# Patient Record
Sex: Female | Born: 1940 | Race: White | Hispanic: No | Smoking: Never smoker
Health system: Southern US, Community
[De-identification: ages and names within clinical notes are randomized; demographics above are authoritative.]

## PROBLEM LIST (undated history)

## (undated) DIAGNOSIS — R002 Palpitations: Secondary | ICD-10-CM

## (undated) DIAGNOSIS — R519 Headache, unspecified: Secondary | ICD-10-CM

## (undated) DIAGNOSIS — K5900 Constipation, unspecified: Secondary | ICD-10-CM

## (undated) DIAGNOSIS — I493 Ventricular premature depolarization: Secondary | ICD-10-CM

## (undated) DIAGNOSIS — D649 Anemia, unspecified: Secondary | ICD-10-CM

## (undated) DIAGNOSIS — E785 Hyperlipidemia, unspecified: Secondary | ICD-10-CM

## (undated) DIAGNOSIS — M797 Fibromyalgia: Secondary | ICD-10-CM

## (undated) DIAGNOSIS — C801 Malignant (primary) neoplasm, unspecified: Secondary | ICD-10-CM

## (undated) HISTORY — PX: BACK SURGERY: SHX140

## (undated) HISTORY — PX: TUBAL LIGATION: SHX77

## (undated) HISTORY — PX: BRAIN SURGERY: SHX531

## (undated) HISTORY — PX: BREAST SURGERY: SHX581

## (undated) HISTORY — PX: OTHER SURGICAL HISTORY: SHX169

## (undated) HISTORY — PX: CHOLECYSTECTOMY: SHX55

---

## 1992-10-27 HISTORY — PX: CHOLECYSTECTOMY: SHX55

## 1998-10-27 HISTORY — PX: BRAIN SURGERY: SHX531

## 2007-09-10 ENCOUNTER — Encounter: Admission: RE | Admit: 2007-09-10 | Discharge: 2007-09-10 | Payer: Self-pay | Admitting: Obstetrics and Gynecology

## 2009-02-27 ENCOUNTER — Emergency Department (HOSPITAL_COMMUNITY): Admission: EM | Admit: 2009-02-27 | Discharge: 2009-02-27 | Payer: Self-pay | Admitting: Emergency Medicine

## 2009-03-21 ENCOUNTER — Encounter: Admission: RE | Admit: 2009-03-21 | Discharge: 2009-03-21 | Payer: Self-pay | Admitting: Gastroenterology

## 2009-12-27 IMAGING — CT CT VIRTUAL COLONOSCOPY DIAGNOSTIC
3 of 7 series · 15 of 46 positions shown, 17 images · non-contrast
Comparison: No previous studies for comparison.

CLINICAL DATA: Left side abdominal pain with blood in stool.
Alternating diarrhea and constipation. Previous colonoscopy on
01/04/1999.

CT VIRTUAL COLONOSCOPY DIAGNOSTIC
TECHNIQUE: The patient was given a standard LoSo bowel
preparation with Gastrografin and barium for fluid and stool
tagging respectively.  The quality of the bowel preparation is
excellent.  Automated CO2 insufflation of the colon was performed
prior to image acquisition and colonic distention is moderate to
poor.  Image post processing was used to generate a 3D endoluminal
fly-through projection of the colon and to electronically subtract
stool/fluid as appropriate.

[Series 2: supine · axial · 0.79mm/px · z∈[-411,-31]mm · 9 of 381 slices shown, 11 images (1 of 2)]
[im 39/381  soft-tissue]
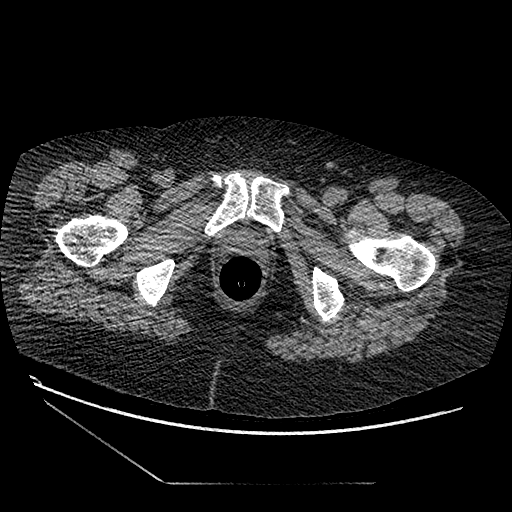
[im 39/381  bone]
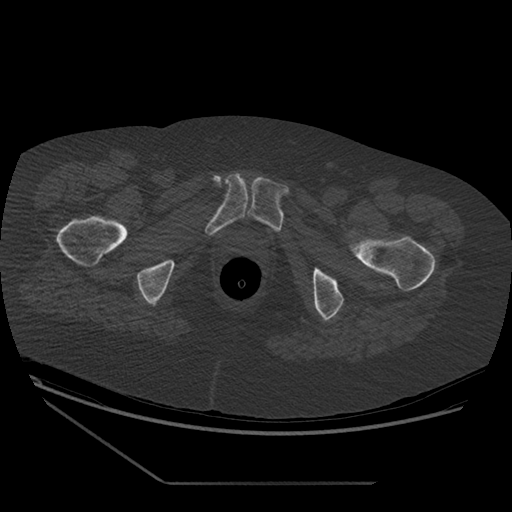
[im 77/381  soft-tissue]
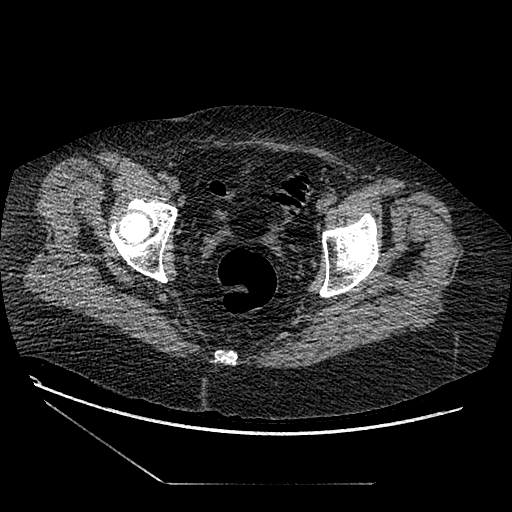
[im 115/381  soft-tissue]
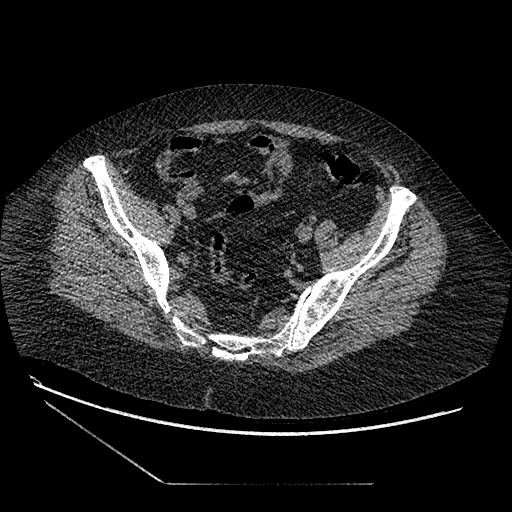
[im 153/381  soft-tissue]
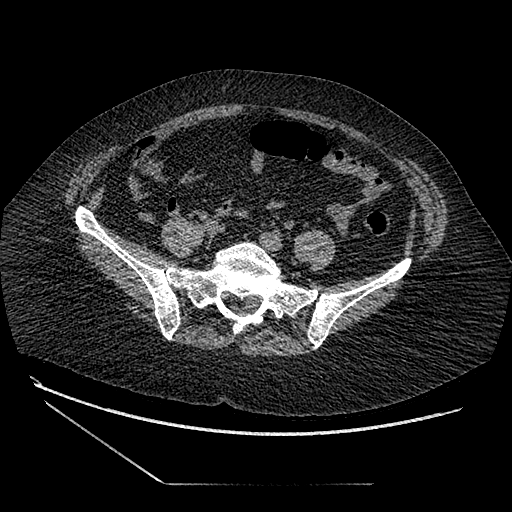
[im 191/381  soft-tissue]
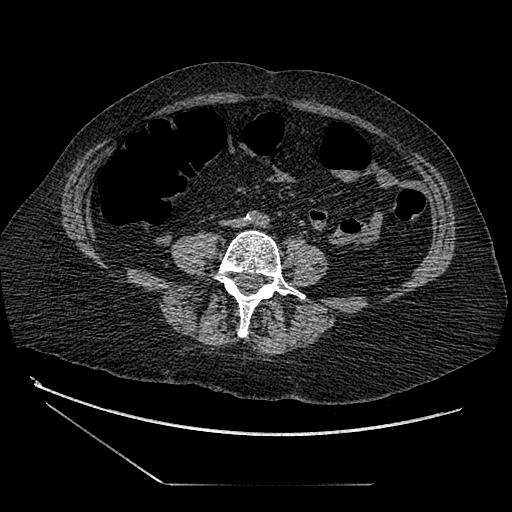
[im 229/381  soft-tissue]
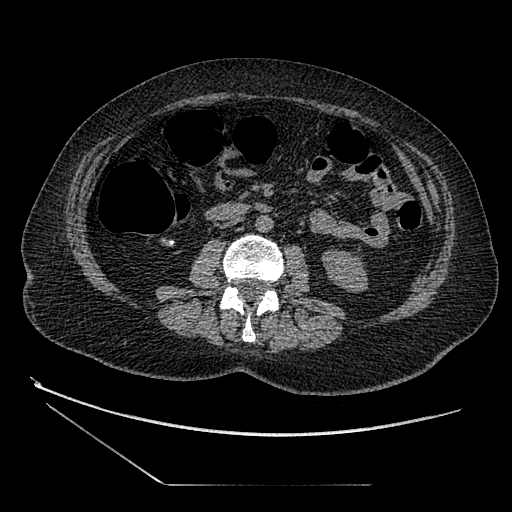
[im 267/381  soft-tissue]
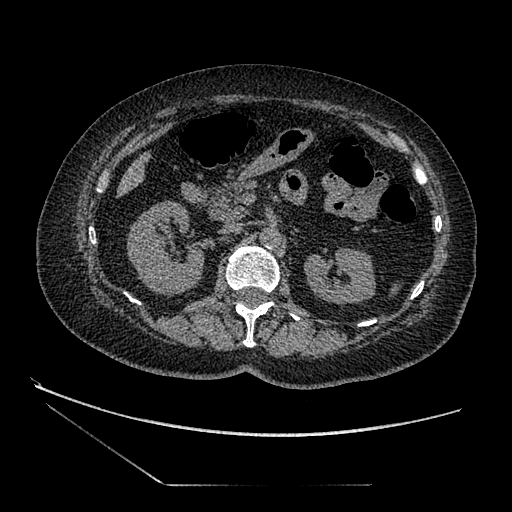
[im 305/381  soft-tissue]
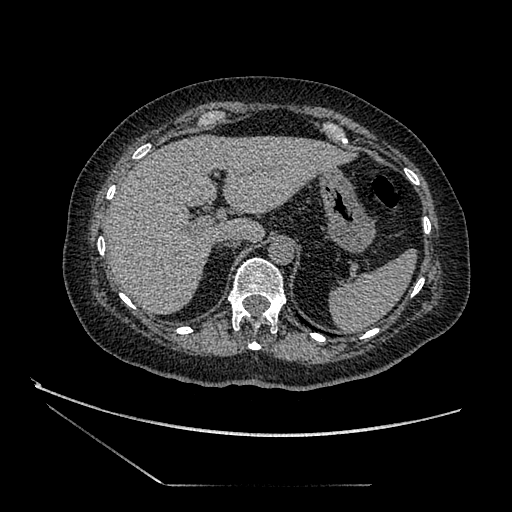
[im 343/381  soft-tissue]
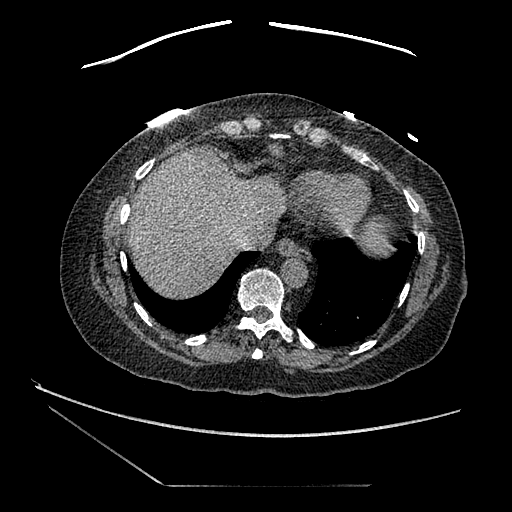
[im 343/381  bone]
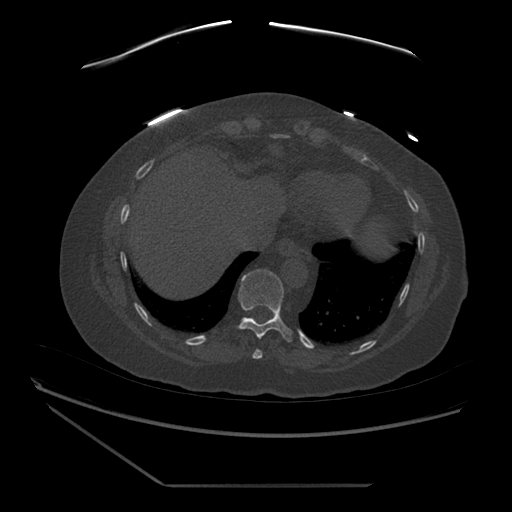

[Series 3: supine · axial · 0.79mm/px · z∈[-341,-103]mm · 3 of 191 slices shown (2 of 2)]
[im 48/191  soft-tissue]
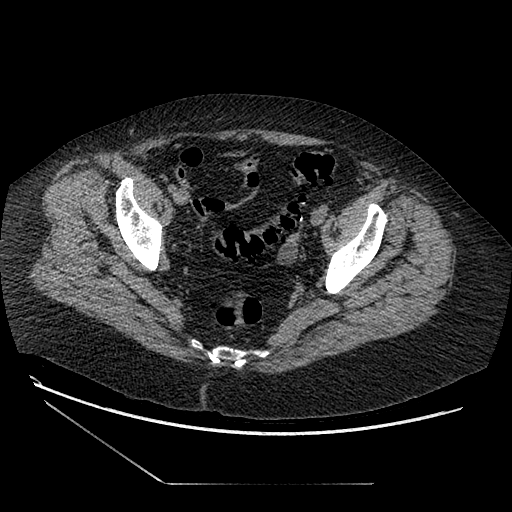
[im 96/191  soft-tissue]
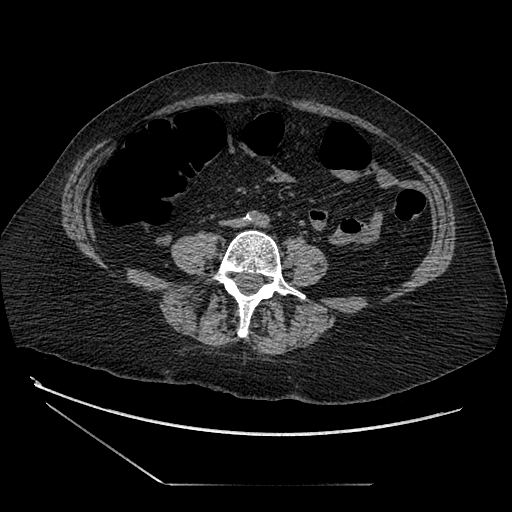
[im 143/191  soft-tissue]
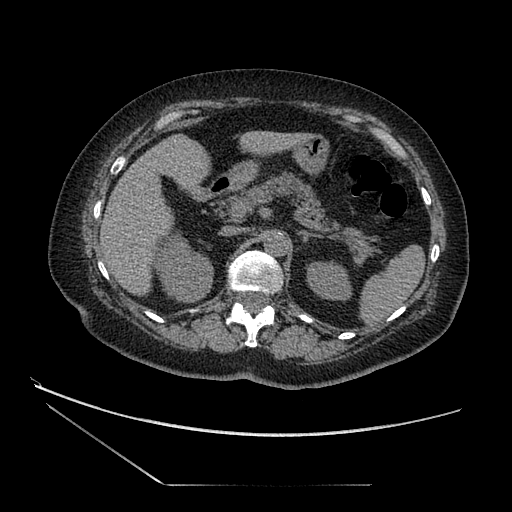

[Series 200: cor · coronal · 0.92mm/px · 3 of 121 slices shown]
[im 41/121  soft-tissue]
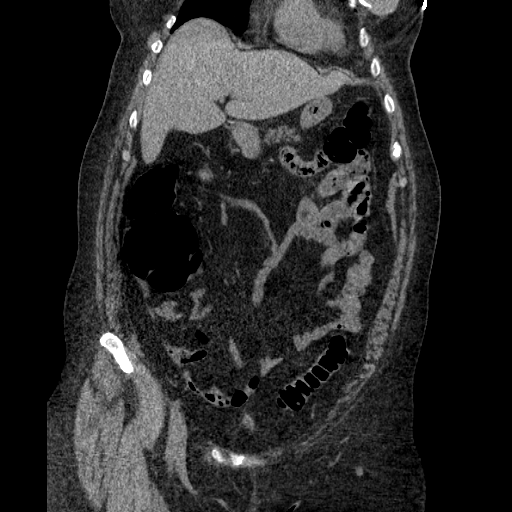
[im 54/121  soft-tissue]
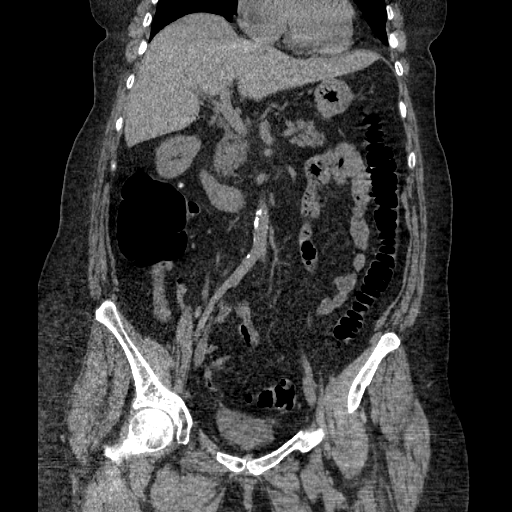
[im 67/121  soft-tissue]
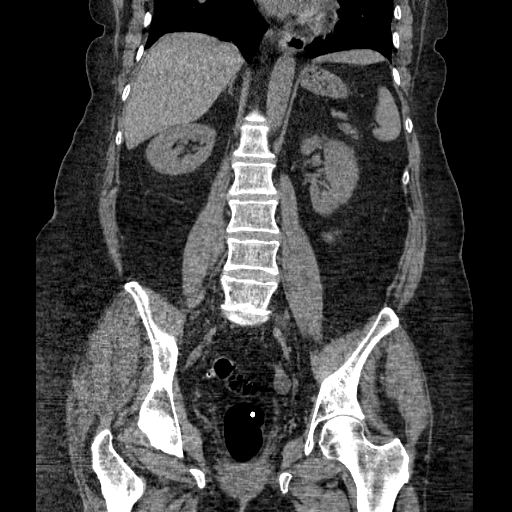

[15 of 46 positions shown; findings below may reference images not displayed]

FINDINGS: There is no clinically significant colonic polyp or mass.
The patient does have scattered diverticular change along the
length of the colon without evidence for diverticulitis.  The
terminal ileum is normal.  Despite the reported history of
appendectomy, a normal appendix is visualized on today's exam.
IMPRESSION: No clinically significant colonic polyp or mass.  This is a C-RADS
category C1 exam for normal virtual colonoscopy.

Virtual colonoscopy is not designed to detect diminutive polyps
(i.e., less than or equal to 5 mm), the presence or absence of
which may not affect clinical management

CT ABDOMEN AND PELVIS WITHOUT CONTRAST

CT ABDOMEN
FINDINGS: Several very tiny hypodensities are identified in the
right liver.  By report, the patient had some liver lesions seen on
an outside CT scan, which we do not have access to.  These lesions
are quite small on today's study and although they cannot be
definitively characterize, are probably related to a tiny hepatic
cysts.  No focal abnormalities seen in the spleen.  The stomach,
duodenum, pancreas, and adrenal glands are normal.  The gallbladder
is surgically absent.

Right kidney is unremarkable.  A 1.3 cm hyperattenuating lesion is
seen in the lower pole of the left kidney.

There is no intraperitoneal free fluid.  No abdominal
lymphadenopathy.  No abdominal aortic aneurysm.  Abdominal small
bowel loops have normal imaging features.
IMPRESSION: 1.3 cm hyperattenuating lesion in the lower pole of the left
kidney.  This may well be a cyst complicated by proteinaceous
debris or hemorrhage, but not less likely possibility would be a
solid renal neoplasm.  Comparison to the patient's previous imaging
studies would be the most helpful means to assess stability.  If
there is no previous imaging available, MRI of the kidney without
and with contrast would be the most helpful study to further
evaluate as this may allow definitive characterization and preclude
the need for additional follow-up imaging.

CT PELVIS
FINDINGS: No intraperitoneal free fluid.  No lymphadenopathy.  The
bladder is decompressed.  The uterus is surgically absent.  There
is no adnexal mass.  Pelvic bowel loops are unremarkable.

Bone windows revealed no worrisome lytic or sclerotic osseous
lesion.
IMPRESSION: Unremarkable CT scan of the pelvis.

## 2010-11-17 ENCOUNTER — Encounter: Payer: Self-pay | Admitting: Obstetrics and Gynecology

## 2011-02-04 LAB — POCT CARDIAC MARKERS
CKMB, poc: <1 ng/mL — ABNORMAL LOW (ref 1.0–8.0)
Myoglobin, poc: 39.3 ng/mL (ref 12–200)

## 2011-02-04 LAB — URINALYSIS, ROUTINE W REFLEX MICROSCOPIC
Bilirubin Urine: NEGATIVE
Glucose, UA: NEGATIVE mg/dL
Hgb urine dipstick: NEGATIVE
Ketones, ur: NEGATIVE mg/dL
Nitrite: NEGATIVE
Specific Gravity, Urine: 1.024 (ref 1.005–1.030)

## 2011-02-04 LAB — COMPREHENSIVE METABOLIC PANEL
Albumin: 3.3 g/dL — ABNORMAL LOW (ref 3.5–5.2)
Chloride: 99 mEq/L (ref 96–112)
GFR calc Af Amer: >60 mL/min (ref >60)
Sodium: 139 mEq/L (ref 135–145)
Total Bilirubin: 0.6 mg/dL (ref 0.3–1.2)

## 2011-02-04 LAB — CBC
HCT: 42.9 % (ref 36.0–46.0)
Hemoglobin: 14.6 g/dL (ref 12.0–15.0)
MCHC: 34 g/dL (ref 30.0–36.0)
MCV: 94.3 fL (ref 78.0–100.0)
RBC: 4.55 MIL/uL (ref 3.87–5.11)
RDW: 14.1 % (ref 11.5–15.5)
WBC: 14.1 10*3/uL — ABNORMAL HIGH (ref 4.0–10.5)

## 2011-02-04 LAB — DIFFERENTIAL
Basophils Absolute: 0.1 10*3/uL (ref 0.0–0.1)
Basophils Relative: 1 % (ref 0–1)
Eosinophils Absolute: 0.2 10*3/uL (ref 0.0–0.7)
Lymphocytes Relative: 19 % (ref 12–46)
Monocytes Absolute: 0.9 10*3/uL (ref 0.1–1.0)
Monocytes Relative: 7 % (ref 3–12)

## 2014-05-26 DIAGNOSIS — E785 Hyperlipidemia, unspecified: Secondary | ICD-10-CM

## 2014-05-26 DIAGNOSIS — J302 Other seasonal allergic rhinitis: Secondary | ICD-10-CM

## 2014-05-26 DIAGNOSIS — D329 Benign neoplasm of meninges, unspecified: Secondary | ICD-10-CM

## 2014-05-26 DIAGNOSIS — M797 Fibromyalgia: Secondary | ICD-10-CM | POA: Insufficient documentation

## 2014-05-26 DIAGNOSIS — I10 Essential (primary) hypertension: Secondary | ICD-10-CM | POA: Insufficient documentation

## 2014-05-26 HISTORY — DX: Other seasonal allergic rhinitis: J30.2

## 2014-05-26 HISTORY — DX: Hyperlipidemia, unspecified: E78.5

## 2014-05-26 HISTORY — DX: Benign neoplasm of meninges, unspecified: D32.9

## 2014-05-26 HISTORY — DX: Essential (primary) hypertension: I10

## 2014-06-28 DIAGNOSIS — G43B1 Ophthalmoplegic migraine, intractable: Secondary | ICD-10-CM

## 2014-06-28 DIAGNOSIS — R5382 Chronic fatigue, unspecified: Secondary | ICD-10-CM

## 2014-06-28 DIAGNOSIS — J0130 Acute sphenoidal sinusitis, unspecified: Secondary | ICD-10-CM

## 2014-06-28 HISTORY — DX: Chronic fatigue, unspecified: R53.82

## 2014-06-28 HISTORY — DX: Acute sphenoidal sinusitis, unspecified: J01.30

## 2014-06-28 HISTORY — DX: Ophthalmoplegic migraine, intractable: G43.B1

## 2015-07-04 DIAGNOSIS — K2270 Barrett's esophagus without dysplasia: Secondary | ICD-10-CM | POA: Insufficient documentation

## 2015-07-04 DIAGNOSIS — K209 Esophagitis, unspecified without bleeding: Secondary | ICD-10-CM

## 2015-07-04 HISTORY — DX: Esophagitis, unspecified without bleeding: K20.90

## 2015-08-14 DIAGNOSIS — Z0000 Encounter for general adult medical examination without abnormal findings: Secondary | ICD-10-CM

## 2015-08-14 HISTORY — DX: Encounter for general adult medical examination without abnormal findings: Z00.00

## 2016-04-16 DIAGNOSIS — H9313 Tinnitus, bilateral: Secondary | ICD-10-CM

## 2016-04-16 DIAGNOSIS — H9202 Otalgia, left ear: Secondary | ICD-10-CM

## 2016-04-16 HISTORY — DX: Otalgia, left ear: H92.02

## 2016-04-16 HISTORY — DX: Tinnitus, bilateral: H93.13

## 2016-10-07 DIAGNOSIS — K5900 Constipation, unspecified: Secondary | ICD-10-CM | POA: Insufficient documentation

## 2016-10-07 DIAGNOSIS — K2970 Gastritis, unspecified, without bleeding: Secondary | ICD-10-CM

## 2016-10-07 HISTORY — DX: Gastritis, unspecified, without bleeding: K29.70

## 2017-04-06 DIAGNOSIS — Z9882 Breast implant status: Secondary | ICD-10-CM

## 2017-04-06 DIAGNOSIS — R2230 Localized swelling, mass and lump, unspecified upper limb: Secondary | ICD-10-CM | POA: Insufficient documentation

## 2017-04-06 DIAGNOSIS — R222 Localized swelling, mass and lump, trunk: Secondary | ICD-10-CM

## 2017-04-06 HISTORY — DX: Localized swelling, mass and lump, unspecified upper limb: R22.30

## 2017-04-06 HISTORY — DX: Breast implant status: Z98.82

## 2017-04-06 HISTORY — DX: Localized swelling, mass and lump, trunk: R22.2

## 2019-05-31 ENCOUNTER — Emergency Department (HOSPITAL_COMMUNITY): Payer: Medicare Other

## 2019-05-31 ENCOUNTER — Encounter (HOSPITAL_COMMUNITY): Payer: Self-pay

## 2019-05-31 ENCOUNTER — Emergency Department (HOSPITAL_COMMUNITY)
Admission: EM | Admit: 2019-05-31 | Discharge: 2019-05-31 | Disposition: A | Discharge status: Home / Self Care | Payer: Medicare Other | Attending: Emergency Medicine | Admitting: Emergency Medicine

## 2019-05-31 ENCOUNTER — Other Ambulatory Visit: Payer: Self-pay

## 2019-05-31 DIAGNOSIS — R109 Unspecified abdominal pain: Secondary | ICD-10-CM | POA: Diagnosis present

## 2019-05-31 DIAGNOSIS — Z85841 Personal history of malignant neoplasm of brain: Secondary | ICD-10-CM | POA: Insufficient documentation

## 2019-05-31 DIAGNOSIS — Z79899 Other long term (current) drug therapy: Secondary | ICD-10-CM | POA: Diagnosis not present

## 2019-05-31 DIAGNOSIS — N281 Cyst of kidney, acquired: Secondary | ICD-10-CM | POA: Diagnosis not present

## 2019-05-31 DIAGNOSIS — K5909 Other constipation: Secondary | ICD-10-CM | POA: Diagnosis not present

## 2019-05-31 HISTORY — DX: Malignant (primary) neoplasm, unspecified: C80.1

## 2019-05-31 LAB — URINALYSIS, ROUTINE W REFLEX MICROSCOPIC
Bilirubin Urine: NEGATIVE
Glucose, UA: NEGATIVE mg/dL
Ketones, ur: 20 mg/dL — AB
Nitrite: NEGATIVE
Protein, ur: NEGATIVE mg/dL
Specific Gravity, Urine: 1.01 (ref 1.005–1.030)
pH: 5 (ref 5.0–8.0)

## 2019-05-31 LAB — COMPREHENSIVE METABOLIC PANEL
ALT: 12 U/L (ref 0–44)
AST: 14 U/L — ABNORMAL LOW (ref 15–41)
Albumin: 3.9 g/dL (ref 3.5–5.0)
Alkaline Phosphatase: 79 U/L (ref 38–126)
Anion gap: 12 (ref 5–15)
BUN: 10 mg/dL (ref 8–23)
CO2: 22 mmol/L (ref 22–32)
Calcium: 9 mg/dL (ref 8.9–10.3)
Chloride: 103 mmol/L (ref 98–111)
Creatinine, Ser: 0.87 mg/dL (ref 0.44–1.00)
GFR calc Af Amer: >60 mL/min (ref >60)
GFR calc non Af Amer: >60 mL/min (ref >60)
Glucose, Bld: 117 mg/dL — ABNORMAL HIGH (ref 70–99)
Potassium: 3.4 mmol/L — ABNORMAL LOW (ref 3.5–5.1)
Sodium: 137 mmol/L (ref 135–145)
Total Bilirubin: 0.5 mg/dL (ref 0.3–1.2)
Total Protein: 7.4 g/dL (ref 6.5–8.1)

## 2019-05-31 LAB — CBC
HCT: 38.9 % (ref 36.0–46.0)
Hemoglobin: 11.3 g/dL — ABNORMAL LOW (ref 12.0–15.0)
MCH: 24.4 pg — ABNORMAL LOW (ref 26.0–34.0)
MCHC: 29 g/dL — ABNORMAL LOW (ref 30.0–36.0)
MCV: 83.8 fL (ref 80.0–100.0)
Platelets: 372 10*3/uL (ref 150–400)
RBC: 4.64 MIL/uL (ref 3.87–5.11)
RDW: 16.9 % — ABNORMAL HIGH (ref 11.5–15.5)
WBC: 10.5 10*3/uL (ref 4.0–10.5)
nRBC: 0 % (ref 0.0–0.2)

## 2019-05-31 LAB — LIPASE, BLOOD: Lipase: 21 U/L (ref 11–51)

## 2019-05-31 MED ORDER — SODIUM CHLORIDE 0.9 % IV BOLUS
1000.0000 mL | Freq: Once | INTRAVENOUS | Status: AC
  Administered 2019-05-31: 1000 mL via INTRAVENOUS

## 2019-05-31 MED ORDER — SODIUM CHLORIDE 0.9% FLUSH: 3.0000 mL | Freq: Once | INTRAVENOUS | Status: DC

## 2019-05-31 MED ORDER — SODIUM CHLORIDE (PF) 0.9 % IJ SOLN
INTRAMUSCULAR | Status: AC
  Filled 2019-05-31: qty 50

## 2019-05-31 MED ORDER — FLEET ENEMA 7-19 GM/118ML RE ENEM
1.0000 | ENEMA | Freq: Once | RECTAL | Status: AC
  Administered 2019-05-31: 1 via RECTAL
  Filled 2019-05-31: qty 1

## 2019-05-31 MED ORDER — IOHEXOL 300 MG/ML  SOLN
100.0000 mL | Freq: Once | INTRAMUSCULAR | Status: AC | PRN
  Administered 2019-05-31: 17:00:00 100 mL via INTRAVENOUS

## 2019-05-31 NOTE — ED Provider Notes (Signed)
[facility] DEPT Provider Note   CSN: [id number] Arrival date & time: 05/31/19  1301    History   Chief Complaint Chief Complaint  Patient presents with   Abdominal Pain    HPI Jennifer Melendez is a 78 y.o. female hx of brain cancer here with abdominal pain, constipation.  Patient has been constipated for about a week or so. Patient states that she tried MiraLAX and enemas with minimal relief.  She states that she has very minimal stool that comes out.  She states that she has hemorrhoids and they have getting very painful.  She has decreased appetite but had no vomiting.  Denies any fevers or chills.  She called her doctor and was sent here for further evaluation.  Denies any history of SBO in the past.     The history is provided by the patient.    Past Medical History:  Diagnosis Date   Cancer ([place])     There are no active problems to display for this patient.   Past Surgical History:  Procedure Laterality Date   BRAIN SURGERY     CHOLECYSTECTOMY       OB History   No obstetric history on file.      Home Medications    Prior to Admission medications   Medication Sig Start Date End Date Taking? Authorizing Provider  acetaminophen (TYLENOL) 325 MG tablet Take 650 mg by mouth every 6 (six) hours as needed for moderate pain.   Yes [provider]  amLODipine (NORVASC) 5 MG tablet Take 5 mg by mouth daily. 05/30/19  Yes [provider]  calcium-vitamin D (OSCAL WITH D) 500-200 MG-UNIT tablet Take 1 tablet by mouth daily.   Yes [provider]  dicyclomine (BENTYL) 10 MG capsule Take 10 mg by mouth 2 (two) times daily. 03/07/19  Yes [provider]  pregabalin (LYRICA) 100 MG capsule Take 100 mg by mouth 2 (two) times daily.   Yes [provider]  vitamin B-12 (CYANOCOBALAMIN) 1000 MCG tablet Take 1,000 mcg by mouth daily.   Yes [provider]    Family History No family history  on file.  Social History Social History   Tobacco Use   Smoking status: Never Smoker   Smokeless tobacco: Never Used  Substance Use Topics   Alcohol use: Never    Frequency: Never   Drug use: Never     Allergies   Hydrocodone-acetaminophen, Morphine, Morpholine salicylate, Prednisolone, Prednisone, Gabapentin, and Ofloxacin   Review of Systems Review of Systems  Gastrointestinal: Positive for abdominal pain.  All other systems reviewed and are negative.    Physical Exam Updated Vital Signs BP (!) 158/69 (BP Location: Left Arm)    Pulse (!) 23    Temp 99.2 F (37.3 C) (Oral)    Resp 16    Ht 5\' 7"  (1.702 m)    Wt 86.2 kg    SpO2 98%    BMI 29.76 kg/m   Physical Exam Vitals signs and nursing note reviewed.  HENT:     Head: Normocephalic.  Eyes:     Extraocular Movements: Extraocular movements intact.  Cardiovascular:     Rate and Rhythm: Normal rate and regular rhythm.  Pulmonary:     Effort: Pulmonary effort is normal.     Breath sounds: Normal breath sounds.  Abdominal:     General: Abdomen is flat. Bowel sounds are normal.     Palpations: Abdomen is soft.  Comments: Mild LLQ tenderness, no rebound   Genitourinary:    Comments: Rectal- small external hemorrhoids, not thrombosed. Some stool but no obvious stool impaction  Skin:    General: Skin is warm.     Capillary Refill: Capillary refill takes less than 2 seconds.  Neurological:     General: No focal deficit present.     Mental Status: She is alert and oriented to person, place, and time.  Psychiatric:        Mood and Affect: Mood normal.        Behavior: Behavior normal.      ED Treatments / Results  Labs (all labs ordered are listed, but only abnormal results are displayed) Labs Reviewed  COMPREHENSIVE METABOLIC PANEL - Abnormal; Notable for the following components:      Result Value   Potassium 3.4 (*)    Glucose, Bld 117 (*)    AST 14 (*)    All other components within normal  limits  CBC - Abnormal; Notable for the following components:   Hemoglobin 11.3 (*)    MCH 24.4 (*)    MCHC 29.0 (*)    RDW 16.9 (*)    All other components within normal limits  URINALYSIS, ROUTINE W REFLEX MICROSCOPIC - Abnormal; Notable for the following components:   Hgb urine dipstick SMALL (*)    Ketones, ur 20 (*)    Leukocytes,Ua SMALL (*)    Bacteria, UA RARE (*)    All other components within normal limits  LIPASE, BLOOD    EKG None  Radiology Ct Abdomen Pelvis W Contrast  Result Date: 05/31/2019 CLINICAL DATA:  Abdominal pain for 7 days. EXAM: CT ABDOMEN AND PELVIS WITH CONTRAST TECHNIQUE: Multidetector CT imaging of the abdomen and pelvis was performed using the standard protocol following bolus administration of intravenous contrast. CONTRAST:  172mL OMNIPAQUE IOHEXOL 300 MG/ML  SOLN COMPARISON:  August 18, 2016. FINDINGS: Lower chest: Small hiatal hernia. Partially visualized calcified right breast implant. Hepatobiliary: No focal liver abnormality is seen. Status post cholecystectomy. No biliary dilatation. Pancreas: Unremarkable. No pancreatic ductal dilatation or surrounding inflammatory changes. Spleen: Normal in size without focal abnormality. Adrenals/Urinary Tract: Normal adrenal glands. Normal appearance of the right kidney, ureters and urinary bladder. Circumscribed hyperdense mass exophytic off of the lateral midpole region of the left kidney measures 1.8 cm. Stomach/Bowel: Stomach is within normal limits. Appendix appears normal. No evidence of small bowel wall thickening, distention, or inflammatory changes. Mild mucosal thickening of the rectum with perirectal fat stranding with small amount of fluid in the presacral space. Formed stool within the rectum. Sigmoid diverticulosis without evidence of diverticulitis. Vascular/Lymphatic: Aortic atherosclerosis. No enlarged abdominal or pelvic lymph nodes. Reproductive: Status post hysterectomy. No adnexal masses. Other:  None. Musculoskeletal: No acute or significant osseous findings. IMPRESSION: 1. 1.8 cm hyperdense mass exophytic off of the lateral midpole region of the left kidney. This may represent a hemorrhagic or proteinaceous cyst, however a solid renal mass cannot be excluded. The mass is stable in size when compared to CT from October 2017. 2. Sigmoid diverticulosis without evidence of diverticulitis. 3. Mild mucosal thickening of the rectum with perirectal fat stranding and small amount of fluid in the presacral space. 4. Small hiatal hernia. Aortic Atherosclerosis (ICD10-I70.0). Electronically Signed   By: Fidela Salisbury M.D.   On: 05/31/2019 17:51   Dg Abd Acute 2+v W 1v Chest  Result Date: 05/31/2019 CLINICAL DATA:  Constipation over the last week. EXAM: DG ABDOMEN ACUTE W/ 1V  CHEST COMPARISON:  09/03/2018 FINDINGS: The patient has a moderate amount of fecal matter, particularly in the rectosigmoid region and cecum. No sign of an obstructive bowel gas pattern. Previous cholecystectomy clips. No significant bone finding. One-view chest shows normal heart size. Ordinary age related aortic atherosclerosis. No infiltrate, effusion or collapse. No free air seen under the diaphragm. IMPRESSION: The patient does have a moderate amount of fecal matter in the colon, particularly the cecal region and rectosigmoid region. See above discussion. Electronically Signed   By: Nelson Chimes M.D.   On: 05/31/2019 17:34    Procedures Procedures (including critical care time)  Medications Ordered in ED Medications  sodium chloride flush (NS) 0.9 % injection 3 mL (0 mLs Intravenous Hold 05/31/19 1525)  sodium phosphate (FLEET) 7-19 GM/118ML enema 1 enema (1 enema Rectal Given 05/31/19 1854)  sodium chloride 0.9 % bolus 1,000 mL (0 mLs Intravenous Stopped 05/31/19 1645)  iohexol (OMNIPAQUE) 300 MG/ML solution 100 mL (100 mLs Intravenous Contrast Given 05/31/19 1724)     Initial Impression / Assessment and Plan / ED Course  I  have reviewed the triage vital signs and the nursing notes.  Pertinent labs & imaging results that were available during my care of the patient were reviewed by me and considered in my medical decision making (see chart for details).       LEKEISHA ARENAS is a 78 y.o. female here with constipation. Has some distention but I think likely constipation. Will get labs, CT to r/o SBO. Will give enema for constipation.   7:30 PM CT showed constipation. She has L kidney cyst vs mass that is stable. She can follow up outpatient.    Final Clinical Impressions(s) / ED Diagnoses   Final diagnoses:  None    ED Discharge Orders    None       Drenda Freeze, MD 05/31/19 [phone]

## 2019-05-31 NOTE — Discharge Instructions (Addendum)
Increase miralax to twice daily for a week. Stay hydrated   You can continue anusol cream for hemorrhoids   You have a cyst in your kidney that is stable. You need follow up with your doctor with routine CT scans   Return to ER if you have worse abdominal pain, vomiting, fever, no bowel movements for a week

## 2019-05-31 NOTE — ED Triage Notes (Signed)
Pt BIBA from home. Pt c/o abd pain, constipation x 7 days.  Pt is eating okay. Denies N/V/D. New BP meds Thursday.  No urinary issues.

## 2019-06-10 ENCOUNTER — Emergency Department (HOSPITAL_BASED_OUTPATIENT_CLINIC_OR_DEPARTMENT_OTHER)
Admission: EM | Admit: 2019-06-10 | Discharge: 2019-06-10 | Disposition: A | Discharge status: Home / Self Care | Payer: Medicare Other | Attending: Emergency Medicine | Admitting: Emergency Medicine

## 2019-06-10 ENCOUNTER — Encounter (HOSPITAL_BASED_OUTPATIENT_CLINIC_OR_DEPARTMENT_OTHER): Payer: Self-pay

## 2019-06-10 ENCOUNTER — Emergency Department (HOSPITAL_BASED_OUTPATIENT_CLINIC_OR_DEPARTMENT_OTHER): Payer: Medicare Other

## 2019-06-10 ENCOUNTER — Other Ambulatory Visit: Payer: Self-pay

## 2019-06-10 DIAGNOSIS — Z79899 Other long term (current) drug therapy: Secondary | ICD-10-CM | POA: Diagnosis not present

## 2019-06-10 DIAGNOSIS — I493 Ventricular premature depolarization: Secondary | ICD-10-CM

## 2019-06-10 DIAGNOSIS — R002 Palpitations: Secondary | ICD-10-CM

## 2019-06-10 DIAGNOSIS — R0789 Other chest pain: Secondary | ICD-10-CM

## 2019-06-10 DIAGNOSIS — R079 Chest pain, unspecified: Secondary | ICD-10-CM | POA: Diagnosis present

## 2019-06-10 HISTORY — DX: Other chest pain: R07.89

## 2019-06-10 HISTORY — DX: Ventricular premature depolarization: I49.3

## 2019-06-10 LAB — BASIC METABOLIC PANEL
Anion gap: 11 (ref 5–15)
BUN: 9 mg/dL (ref 8–23)
CO2: 24 mmol/L (ref 22–32)
Calcium: 9.2 mg/dL (ref 8.9–10.3)
Chloride: 104 mmol/L (ref 98–111)
Creatinine, Ser: 0.92 mg/dL (ref 0.44–1.00)
GFR calc Af Amer: >60 mL/min (ref >60)
GFR calc non Af Amer: 60 mL/min — ABNORMAL LOW (ref >60)
Glucose, Bld: 128 mg/dL — ABNORMAL HIGH (ref 70–99)
Potassium: 4.6 mmol/L (ref 3.5–5.1)
Sodium: 139 mmol/L (ref 135–145)

## 2019-06-10 LAB — CBC
HCT: 39.6 % (ref 36.0–46.0)
Hemoglobin: 11.7 g/dL — ABNORMAL LOW (ref 12.0–15.0)
MCH: 24.6 pg — ABNORMAL LOW (ref 26.0–34.0)
MCHC: 29.5 g/dL — ABNORMAL LOW (ref 30.0–36.0)
MCV: 83.4 fL (ref 80.0–100.0)
Platelets: 360 10*3/uL (ref 150–400)
RBC: 4.75 MIL/uL (ref 3.87–5.11)
RDW: 17.2 % — ABNORMAL HIGH (ref 11.5–15.5)
WBC: 13.1 10*3/uL — ABNORMAL HIGH (ref 4.0–10.5)
nRBC: 0 % (ref 0.0–0.2)

## 2019-06-10 LAB — TROPONIN I (HIGH SENSITIVITY)
Troponin I (High Sensitivity): 6 ng/L (ref <18)
Troponin I (High Sensitivity): 7 ng/L (ref <18)

## 2019-06-10 LAB — D-DIMER, QUANTITATIVE: D-Dimer, Quant: 0.46 ug/mL-FEU (ref 0.00–0.50)

## 2019-06-10 MED ORDER — AMLODIPINE BESYLATE 5 MG PO TABS: 5.0000 mg | ORAL_TABLET | Freq: Once | ORAL | Status: DC

## 2019-06-10 MED ORDER — SODIUM CHLORIDE 0.9% FLUSH
3.0000 mL | Freq: Once | INTRAVENOUS | Status: DC
  Filled 2019-06-10: qty 3

## 2019-06-10 MED ORDER — METOPROLOL TARTRATE 5 MG/5ML IV SOLN: 5.0000 mg | Freq: Once | INTRAVENOUS | Status: DC

## 2019-06-10 NOTE — ED Provider Notes (Signed)
[facility] EMERGENCY DEPARTMENT Provider Note   CSN: [id number] Arrival date & time: 06/10/19  1538    History   Chief Complaint Chief Complaint  Patient presents with  . Chest Pain    HPI Jennifer Melendez is a 78 y.o. female history of brain cancer here presenting with chest pain.  Patient has been having some chest pain and epigastric pain for the last several days.  She states that she also has some palpitations and her heart rate sometimes goes up to the low 100s.  She also noted that her blood pressure has been elevated.  She was on metoprolol before but then was switched to Norvasc.  She went to her doctor and she had some PVCs on her EKG and sent here for evaluation .  She has had no recent travel or history of blood clots or MI or cardiac stents.  I saw her about a week ago for abdominal pain her CT abdomen pelvis was unremarkable and had an incidental left renal cyst.     The history is provided by the patient.    Past Medical History:  Diagnosis Date  . Cancer ([place])     There are no active problems to display for this patient.   Past Surgical History:  Procedure Laterality Date  . BRAIN SURGERY    . CHOLECYSTECTOMY       OB History   No obstetric history on file.      Home Medications    Prior to Admission medications   Medication Sig Start Date End Date Taking? Authorizing Provider  acetaminophen (TYLENOL) 325 MG tablet Take 650 mg by mouth every 6 (six) hours as needed for moderate pain.    [provider]  amLODipine (NORVASC) 5 MG tablet Take 5 mg by mouth daily. 05/30/19   [provider]  calcium-vitamin D (OSCAL WITH D) 500-200 MG-UNIT tablet Take 1 tablet by mouth daily.    [provider]  dicyclomine (BENTYL) 10 MG capsule Take 10 mg by mouth 2 (two) times daily. 03/07/19   [provider]  pregabalin (LYRICA) 100 MG capsule Take 100 mg by mouth 2 (two) times daily.    [provider]   vitamin B-12 (CYANOCOBALAMIN) 1000 MCG tablet Take 1,000 mcg by mouth daily.    [provider]    Family History No family history on file.  Social History Social History   Tobacco Use  . Smoking status: Never Smoker  . Smokeless tobacco: Never Used  Substance Use Topics  . Alcohol use: Never    Frequency: Never  . Drug use: Never     Allergies   Hydrocodone-acetaminophen, Morphine, Morpholine salicylate, Prednisolone, Prednisone, Gabapentin, and Ofloxacin   Review of Systems Review of Systems  Cardiovascular: Positive for chest pain.  All other systems reviewed and are negative.    Physical Exam Updated Vital Signs BP (!) 152/57   Pulse 83   Temp 99.1 F (37.3 C) (Oral)   Resp (!) 23   Ht 5\' 7"  (1.702 m)   Wt 86.6 kg   SpO2 97%   BMI 29.91 kg/m   Physical Exam Vitals signs reviewed.  Constitutional:      Appearance: She is well-developed.  HENT:     Head: Normocephalic.  Eyes:     Extraocular Movements: Extraocular movements intact.     Pupils: Pupils are equal, round, and reactive to light.  Neck:     Musculoskeletal: Normal range of motion and neck  supple.  Cardiovascular:     Heart sounds: Normal heart sounds.  Pulmonary:     Effort: Pulmonary effort is normal.     Breath sounds: Normal breath sounds.  Chest:     Chest wall: No deformity, tenderness or crepitus.  Abdominal:     General: Bowel sounds are normal.     Palpations: Abdomen is soft.  Musculoskeletal: Normal range of motion.     Right lower leg: She exhibits no tenderness. No edema.     Left lower leg: She exhibits no tenderness. No edema.  Skin:    General: Skin is warm.     Capillary Refill: Capillary refill takes less than 2 seconds.  Neurological:     General: No focal deficit present.     Mental Status: She is alert and oriented to person, place, and time.  Psychiatric:        Mood and Affect: Mood normal.        Behavior: Behavior normal.      ED Treatments  / Results  Labs (all labs ordered are listed, but only abnormal results are displayed) Labs Reviewed  BASIC METABOLIC PANEL - Abnormal; Notable for the following components:      Result Value   Glucose, Bld 128 (*)    GFR calc non Af Amer 60 (*)    All other components within normal limits  CBC - Abnormal; Notable for the following components:   WBC 13.1 (*)    Hemoglobin 11.7 (*)    MCH 24.6 (*)    MCHC 29.5 (*)    RDW 17.2 (*)    All other components within normal limits  D-DIMER, QUANTITATIVE (NOT AT [facility])  TROPONIN I (HIGH SENSITIVITY)  TROPONIN I (HIGH SENSITIVITY)    EKG EKG Interpretation  Date/Time:  Friday June 10 2019 15:46:12 EDT Ventricular Rate:  100 PR Interval:  142 QRS Duration: 64 QT Interval:  322 QTC Calculation: 415 R Axis:   67 Text Interpretation:  Normal sinus rhythm Nonspecific ST abnormality Abnormal ECG No previous ECGs available Confirmed by Wandra Arthurs [phone]) on 06/10/2019 4:42:49 PM   Radiology Dg Chest 2 View  Result Date: 06/10/2019 CLINICAL DATA:  Chest pain. EXAM: CHEST - 2 VIEW COMPARISON:  None available currently. FINDINGS: The heart size and mediastinal contours are within normal limits. Both lungs are clear. No pneumothorax or pleural effusion is noted. Atherosclerosis of thoracic aorta is noted. The visualized skeletal structures are unremarkable. IMPRESSION: No active cardiopulmonary disease. Aortic Atherosclerosis (ICD10-I70.0). Electronically Signed   By: Marijo Conception M.D.   On: 06/10/2019 16:28    Procedures Procedures (including critical care time)  Medications Ordered in ED Medications - No data to display   Initial Impression / Assessment and Plan / ED Course  I have reviewed the triage vital signs and the nursing notes.  Pertinent labs & imaging results that were available during my care of the patient were reviewed by me and considered in my medical decision making (see chart for details).       Jennifer Melendez is a 78 y.o. female here with chest pain.  Chest pain and palpitations for the last several days.  Was constipated a week ago but now has regular bowel movements.  Reviewed tracing from PCP office and she has some PVCs. She was on beta-blockers before but now is on Norvasc.  Will get labs, troponin x2, d-dimer, chest x-ray.  I think she likely symptomatic from her PVCs and  will give a dose of Lopressor in the ED. She is already prescribed Toprol XL by her doctor earlier in the day.   7:27 PM Trop neg x 2. D-dimer negative. CXR normal. HR ins the 80s and didn't require lopressor in the ED. She has toprol prescription and I encouraged her to take it    Final Clinical Impressions(s) / ED Diagnoses   Final diagnoses:  None    ED Discharge Orders    None       Drenda Freeze, MD 06/10/19 Kathyrn Drown

## 2019-06-10 NOTE — ED Notes (Signed)
Went to EDP about the order of IV Med. And It was discontinued.  Pt. HR was 86.

## 2019-06-10 NOTE — Discharge Instructions (Signed)
Take your toprol XL as prescribed by your doctor   See your doctor in a week   Return to ER if you have worse chest pain, palpitations.

## 2019-06-10 NOTE — ED Triage Notes (Signed)
Pt c/o central CP started last night-pt sent from PCP office-NAD-steady gait

## 2020-02-14 ENCOUNTER — Other Ambulatory Visit: Payer: Self-pay

## 2020-02-14 ENCOUNTER — Emergency Department (HOSPITAL_BASED_OUTPATIENT_CLINIC_OR_DEPARTMENT_OTHER)
Admission: EM | Admit: 2020-02-14 | Discharge: 2020-02-14 | Disposition: A | Discharge status: Home / Self Care | Payer: Medicare Other | Attending: Emergency Medicine | Admitting: Emergency Medicine

## 2020-02-14 ENCOUNTER — Emergency Department (HOSPITAL_BASED_OUTPATIENT_CLINIC_OR_DEPARTMENT_OTHER): Payer: Medicare Other

## 2020-02-14 ENCOUNTER — Encounter (HOSPITAL_BASED_OUTPATIENT_CLINIC_OR_DEPARTMENT_OTHER): Payer: Self-pay | Admitting: Emergency Medicine

## 2020-02-14 DIAGNOSIS — R002 Palpitations: Secondary | ICD-10-CM | POA: Insufficient documentation

## 2020-02-14 DIAGNOSIS — E785 Hyperlipidemia, unspecified: Secondary | ICD-10-CM | POA: Insufficient documentation

## 2020-02-14 DIAGNOSIS — Z881 Allergy status to other antibiotic agents status: Secondary | ICD-10-CM | POA: Insufficient documentation

## 2020-02-14 DIAGNOSIS — Z888 Allergy status to other drugs, medicaments and biological substances status: Secondary | ICD-10-CM | POA: Diagnosis not present

## 2020-02-14 DIAGNOSIS — Z885 Allergy status to narcotic agent status: Secondary | ICD-10-CM | POA: Diagnosis not present

## 2020-02-14 DIAGNOSIS — Z79899 Other long term (current) drug therapy: Secondary | ICD-10-CM | POA: Diagnosis not present

## 2020-02-14 DIAGNOSIS — D649 Anemia, unspecified: Secondary | ICD-10-CM | POA: Insufficient documentation

## 2020-02-14 HISTORY — DX: Constipation, unspecified: K59.00

## 2020-02-14 HISTORY — DX: Hyperlipidemia, unspecified: E78.5

## 2020-02-14 HISTORY — DX: Ventricular premature depolarization: I49.3

## 2020-02-14 HISTORY — DX: Palpitations: R00.2

## 2020-02-14 HISTORY — DX: Fibromyalgia: M79.7

## 2020-02-14 LAB — CBC WITH DIFFERENTIAL/PLATELET
Abs Immature Granulocytes: 0.04 10*3/uL (ref 0.00–0.07)
Basophils Absolute: 0.1 10*3/uL (ref 0.0–0.1)
Basophils Relative: 1 %
Eosinophils Absolute: 0.1 10*3/uL (ref 0.0–0.5)
Eosinophils Relative: 1 %
HCT: 34.2 % — ABNORMAL LOW (ref 36.0–46.0)
Hemoglobin: 10.1 g/dL — ABNORMAL LOW (ref 12.0–15.0)
Immature Granulocytes: 0 %
Lymphocytes Relative: 15 %
Lymphs Abs: 1.5 10*3/uL (ref 0.7–4.0)
MCH: 23 pg — ABNORMAL LOW (ref 26.0–34.0)
MCHC: 29.5 g/dL — ABNORMAL LOW (ref 30.0–36.0)
MCV: 77.7 fL — ABNORMAL LOW (ref 80.0–100.0)
Monocytes Absolute: 0.8 10*3/uL (ref 0.1–1.0)
Monocytes Relative: 8 %
Neutro Abs: 7.3 10*3/uL (ref 1.7–7.7)
Neutrophils Relative %: 75 %
Platelets: 352 10*3/uL (ref 150–400)
RBC: 4.4 MIL/uL (ref 3.87–5.11)
RDW: 18.1 % — ABNORMAL HIGH (ref 11.5–15.5)
WBC: 9.8 10*3/uL (ref 4.0–10.5)
nRBC: 0 % (ref 0.0–0.2)

## 2020-02-14 LAB — BASIC METABOLIC PANEL
Anion gap: 11 (ref 5–15)
BUN: 11 mg/dL (ref 8–23)
CO2: 25 mmol/L (ref 22–32)
Calcium: 9.3 mg/dL (ref 8.9–10.3)
Chloride: 100 mmol/L (ref 98–111)
Creatinine, Ser: 0.97 mg/dL (ref 0.44–1.00)
GFR calc Af Amer: >60 mL/min (ref >60)
GFR calc non Af Amer: 56 mL/min — ABNORMAL LOW (ref >60)
Glucose, Bld: 143 mg/dL — ABNORMAL HIGH (ref 70–99)
Potassium: 4 mmol/L (ref 3.5–5.1)
Sodium: 136 mmol/L (ref 135–145)

## 2020-02-14 LAB — MAGNESIUM: Magnesium: 2.1 mg/dL (ref 1.7–2.4)

## 2020-02-14 NOTE — ED Triage Notes (Signed)
Pt arrives to ED with c/o feeling that her heart was beating irregular yesterday "beating to fast" pt also states "it was acting up again this morning".

## 2020-02-14 NOTE — Discharge Instructions (Addendum)
You are seen in the emergency department for increased frequency of palpitations.  You had blood work EKG and a chest x-ray.  The only significant finding is that you are slightly anemic.  This will need to be followed up with your primary care doctor.  We are putting a referral into cardiology for you because you have been having the symptoms on and off for a while.  Please continue your regular medications.  Return to the emergency department for any worsening or concerning symptoms

## 2020-02-14 NOTE — ED Provider Notes (Signed)
[place] EMERGENCY DEPARTMENT Provider Note   CSN: [id number] Arrival date & time: 02/14/20  [id number]     History Chief Complaint  Patient presents with  . Irregular Heart Beat    Jennifer Melendez is a 79 y.o. female.  She is complaining of episodes of feeling her heart skip a beat, sometimes fast sometimes hard.  It occurred yesterday and then again occurred today.  She used her blood pressure monitor which read in error signifying heart rhythm problem.  She has had these before and was here back in August with similar.  She is on Toprol for her blood pressure.  She thinks they keep changing the manufacture of her medicine and every month she will have these spells again.  No fevers or chills.  She has had a cough which she associates with allergy issues and took some Mucinex this morning.  The history is provided by the patient.  Palpitations Palpitations quality:  Irregular Onset quality:  Gradual Timing:  Intermittent Progression:  Improving Chronicity:  Recurrent Relieved by:  None tried Worsened by:  Nothing Ineffective treatments:  None tried Associated symptoms: cough and dizziness   Associated symptoms: no chest pain, no diaphoresis, no nausea, no shortness of breath and no vomiting   Risk factors: OTC sinus medications   Risk factors: no diabetes mellitus and no heart disease        Past Medical History:  Diagnosis Date  . Cancer ([place])   . Constipation   . Fibromyalgia   . Hyperlipidemia   . Palpitations   . PVC (premature ventricular contraction)     There are no problems to display for this patient.   Past Surgical History:  Procedure Laterality Date  . BRAIN SURGERY    . CHOLECYSTECTOMY       OB History   No obstetric history on file.     No family history on file.  Social History   Tobacco Use  . Smoking status: Never Smoker  . Smokeless tobacco: Never Used  Substance Use Topics  . Alcohol use: Never  . Drug use: Never     Home Medications Prior to Admission medications   Medication Sig Start Date End Date Taking? Authorizing Provider  pregabalin (LYRICA) 100 MG capsule Take 3 capsules every day as directed by physician. 11/04/19  Yes [provider]  acetaminophen (TYLENOL) 325 MG tablet Take 650 mg by mouth every 6 (six) hours as needed for moderate pain.    [provider]  amLODipine (NORVASC) 5 MG tablet Take 5 mg by mouth daily. 05/30/19   [provider]  calcium-vitamin D (OSCAL WITH D) 500-200 MG-UNIT tablet Take 1 tablet by mouth daily.    [provider]  dicyclomine (BENTYL) 10 MG capsule Take 10 mg by mouth 2 (two) times daily. 03/07/19   [provider]  TOPROL XL 50 MG 24 hr tablet Take 50 mg by mouth daily. 01/06/20   [provider]  triamterene-hydrochlorothiazide (MAXZIDE-25) 37.5-25 MG tablet Take 1 tablet by mouth daily. 01/06/20   [provider]  vitamin B-12 (CYANOCOBALAMIN) 1000 MCG tablet Take 1,000 mcg by mouth daily.    [provider]    Allergies    Hydrocodone-acetaminophen, Morphine, Morpholine salicylate, Prednisolone, Prednisone, Gabapentin, and Ofloxacin  Review of Systems   Review of Systems  Constitutional: Negative for diaphoresis and fever.  HENT: Positive for postnasal drip. Negative for sore throat.   Eyes: Negative for visual disturbance.  Respiratory: Positive for  cough. Negative for shortness of breath.   Cardiovascular: Positive for palpitations. Negative for chest pain.  Gastrointestinal: Negative for abdominal pain, nausea and vomiting.  Genitourinary: Negative for dysuria.  Musculoskeletal: Negative for neck pain.  Skin: Negative for rash.  Neurological: Positive for dizziness.    Physical Exam Updated Vital Signs BP (!) 159/62 (BP Location: Right Arm)   Pulse 82   Resp 14   Ht 5\' 7"  (1.702 m)   Wt 86.2 kg   SpO2 100%   BMI 29.76 kg/m   Physical Exam Vitals and nursing note  reviewed.  Constitutional:      General: She is not in acute distress.    Appearance: She is well-developed.  HENT:     Head: Normocephalic and atraumatic.  Eyes:     Conjunctiva/sclera: Conjunctivae normal.  Cardiovascular:     Rate and Rhythm: Normal rate and regular rhythm. Occasional extrasystoles are present.    Heart sounds: No murmur.  Pulmonary:     Effort: Pulmonary effort is normal. No respiratory distress.     Breath sounds: Normal breath sounds.  Abdominal:     Palpations: Abdomen is soft.     Tenderness: There is no abdominal tenderness.  Musculoskeletal:        General: No deformity or signs of injury. Normal range of motion.     Cervical back: Neck supple.  Skin:    General: Skin is warm and dry.  Neurological:     General: No focal deficit present.     Mental Status: She is alert.     ED Results / Procedures / Treatments   Labs (all labs ordered are listed, but only abnormal results are displayed) Labs Reviewed  BASIC METABOLIC PANEL - Abnormal; Notable for the following components:      Result Value   Glucose, Bld 143 (*)    GFR calc non Af Amer 56 (*)    All other components within normal limits  CBC WITH DIFFERENTIAL/PLATELET - Abnormal; Notable for the following components:   Hemoglobin 10.1 (*)    HCT 34.2 (*)    MCV 77.7 (*)    MCH 23.0 (*)    MCHC 29.5 (*)    RDW 18.1 (*)    All other components within normal limits  MAGNESIUM    EKG EKG Interpretation  Date/Time:  Tuesday February 14 2020 09:50:17 EDT Ventricular Rate:  75 PR Interval:    QRS Duration: 83 QT Interval:  378 QTC Calculation: 423 R Axis:   79 Text Interpretation: Sinus rhythm Atrial premature complex No significant change since prior 8/20 Confirmed by Aletta Edouard [phone]) on 02/14/2020 9:52:30 AM   Radiology DG Chest Port 1 View  Result Date: 02/14/2020 CLINICAL DATA:  Cough, arrhythmia, tachycardia EXAM: PORTABLE CHEST 1 VIEW COMPARISON:  Portable exam 1009 hours  compared to 06/10/2019 FINDINGS: Normal heart size, mediastinal contours, and pulmonary vascularity. Atherosclerotic calcification aorta. Lungs clear. No acute infiltrate, pleural effusion or pneumothorax. Diffuse osseous demineralization with scattered endplate spur formation thoracic spine. Capsular calcification at BILATERAL breast prostheses. IMPRESSION: No acute abnormalities. Aortic atherosclerosis. Electronically Signed   By: Lavonia Dana M.D.   On: 02/14/2020 10:41    Procedures Procedures (including critical care time)  Medications Ordered in ED Medications - No data to display  ED Course  I have reviewed the triage vital signs and the nursing notes.  Pertinent labs & imaging results that were available during my care of the patient were reviewed by me and considered  in my medical decision making (see chart for details).  Clinical Course as of Feb 14 1708  Tue Feb 14, 2020  1001 Patient is also slightly more anemic than her baseline.  She says she has bleeding hemorrhoids sometimes.  She has not had a colonoscopy in greater than 25 years.  I recommended that she talk to her primary care doctor regarding her anemia.   [MB]  [id number] Patient's labs showing hemoglobin at 10.1.  This is down from prior but she said her PCP is also commented that she is a little more anemic than baseline.   [MB]  [id number] Chest x-ray interpreted by me as underpenetrated no gross infiltrates no pneumothorax.   [MB]  [id number] Reviewed work-up with the patient and she is comfortable with discharge.  I will put a referral into cardiology for her.   [MB]    Clinical Course User Index [MB] Hayden Rasmussen, MD   MDM Rules/Calculators/A&P                     This patient complains of palpitations; this involves an extensive number of treatment Options and is a complaint that carries with it a high risk of complications and Morbidity. The differential includes benign palpitations, metabolic derangement, anemia,  infection  I ordered, reviewed and interpreted labs, which included a decreased hemoglobin of 10.1.  This seems to be trending down over the course of a few years.  Glucose is elevated at 143.  Not fasting unclear significance  I ordered imaging studies which included chest x-ray and I independently    visualized and interpreted imaging which showed no gross infiltrates Additional history obtained from daughter Previous records obtained and reviewed in epic  After the interventions stated above, I reevaluated the patient and found stable and essentially asymptomatic.  She still feeling the PACs.  I reviewed the monitor strips and did not see any other arrhythmia.  I placed a referral in for cardiology as it sounds like her palpitations have been going on for a while and she really has not had any significant work-up.  We discussed indications for return.  We also discussed her anemia and the need for follow-up with her primary care doctor  Final Clinical Impression(s) / ED Diagnoses Final diagnoses:  Heart palpitations  Anemia, unspecified type    Rx / DC Orders ED Discharge Orders    None       Hayden Rasmussen, MD 02/14/20 1712

## 2020-02-14 NOTE — ED Notes (Signed)
Pt on monitor 

## 2020-02-14 NOTE — ED Notes (Signed)
ED Provider at bedside. 

## 2020-02-21 ENCOUNTER — Ambulatory Visit: Payer: Medicare Other | Admitting: Cardiology

## 2020-02-21 ENCOUNTER — Other Ambulatory Visit: Payer: Self-pay

## 2020-02-22 ENCOUNTER — Other Ambulatory Visit: Payer: Self-pay

## 2020-02-22 ENCOUNTER — Ambulatory Visit: Payer: Medicare Other | Admitting: Cardiology

## 2020-02-22 ENCOUNTER — Encounter: Payer: Self-pay | Admitting: Cardiology

## 2020-02-22 ENCOUNTER — Ambulatory Visit (INDEPENDENT_AMBULATORY_CARE_PROVIDER_SITE_OTHER): Payer: Medicare Other

## 2020-02-22 VITALS — BP 130/62 | HR 80 | Ht 67.0 in | Wt 194.0 lb

## 2020-02-22 DIAGNOSIS — R002 Palpitations: Secondary | ICD-10-CM

## 2020-02-22 DIAGNOSIS — R0602 Shortness of breath: Secondary | ICD-10-CM

## 2020-02-22 DIAGNOSIS — R079 Chest pain, unspecified: Secondary | ICD-10-CM

## 2020-02-22 DIAGNOSIS — I10 Essential (primary) hypertension: Secondary | ICD-10-CM | POA: Diagnosis not present

## 2020-02-22 NOTE — Patient Instructions (Signed)
Medication Instructions:  Your physician recommends that you continue on your current medications as directed. Please refer to the Current Medication list given to you today.  *If you need a refill on your cardiac medications before your next appointment, please call your pharmacy*   Lab Work: None.  If you have labs (blood work) drawn today and your tests are completely normal, you will receive your results only by: Marland Kitchen MyChart Message (if you have MyChart) OR . A paper copy in the mail If you have any lab test that is abnormal or we need to change your treatment, we will call you to review the results.   Testing/Procedures: Your physician has requested that you have an echocardiogram. Echocardiography is a painless test that uses sound waves to create images of your heart. It provides your doctor with information about the size and shape of your heart and how well your heart's chambers and valves are working. This procedure takes approximately one hour. There are no restrictions for this procedure.  A zio monitor was ordered today. It will remain on for 14 days. You will then return monitor and event diary in provided box. It takes 1-2 weeks for report to be downloaded and returned to [place]. We will call you with the results. If monitor falls off or has orange flashing light, please call Zio for further instructions.     [facility] at [facility] [address] [place], [place] [address] Phone: [phone]    Please arrive 15 minutes prior to your appointment time for registration and insurance purposes.  The test will take approximately 3 to 4 hours to complete; you may bring reading material.  If someone comes with you to your appointment, they will need to remain in the main lobby due to limited space in the testing area. **If you are pregnant or breastfeeding, please notify the nuclear lab prior to your appointment**  How to prepare for your  Myocardial Perfusion Test: . Do not eat or drink 3 hours prior to your test, except you may have water. . Do not consume products containing caffeine (regular or decaffeinated) 12 hours prior to your test. (ex: coffee, chocolate, sodas, tea). Do bring a list of your current medications with you.  If not listed below, you may take your medications as normal. . Do not take metoprolol (Lopressor, Toprol) for 24 hours prior to the test.  Bring the medication to your appointment as you may be required to take it once the test is complete. . Do wear comfortable clothes (no dresses or overalls) and walking shoes, tennis shoes preferred (No heels or open toe shoes are allowed). . Do NOT wear cologne, perfume, aftershave, or lotions (deodorant is allowed). . If these instructions are not followed, your test will have to be rescheduled.  Please report to [address], [address] for your test.  If you have questions or concerns about your appointment, you can call the Nuclear Lab at [phone].  If you cannot keep your appointment, please provide 24 hours notification to the Nuclear Lab, to avoid a possible $50 charge to your account.    Follow-Up: At [facility], you and your health needs are our priority.  As part of our continuing mission to provide you with exceptional heart care, we have created designated Provider Care Teams.  These Care Teams include your primary Cardiologist (physician) and Advanced Practice Providers (APPs -  Physician Assistants and Nurse Practitioners) who all work  together to provide you with the care you need, when you need it.  We recommend signing up for the patient portal called "MyChart".  Sign up information is provided on this After Visit Summary.  MyChart is used to connect with patients for Virtual Visits (Telemedicine).  Patients are able to view lab/test results, encounter notes, upcoming appointments, etc.  Non-urgent messages can be sent to your provider  as well.   To learn more about what you can do with MyChart, go to [email or web address].    Your next appointment:   3 month(s)  The format for your next appointment:   In Person  Provider:   Berniece Salines, DO   Other Instructions   Cardiac Nuclear Scan A cardiac nuclear scan is a test that measures blood flow to the heart when a person is resting and when he or she is exercising. The test looks for problems such as:  Not enough blood reaching a portion of the heart.  The heart muscle not working normally. You may need this test if:  You have heart disease.  You have had abnormal lab results.  You have had heart surgery or a balloon procedure to open up blocked arteries (angioplasty).  You have chest pain.  You have shortness of breath. In this test, a radioactive dye (tracer) is injected into your bloodstream. After the tracer has traveled to your heart, an imaging device is used to measure how much of the tracer is absorbed by or distributed to various areas of your heart. This procedure is usually done at a hospital and takes 2-4 hours. Tell a health care provider about:  Any allergies you have.  All medicines you are taking, including vitamins, herbs, eye drops, creams, and over-the-counter medicines.  Any problems you or family members have had with anesthetic medicines.  Any blood disorders you have.  Any surgeries you have had.  Any medical conditions you have.  Whether you are pregnant or may be pregnant. What are the risks? Generally, this is a safe procedure. However, problems may occur, including:  Serious chest pain and heart attack. This is only a risk if the stress portion of the test is done.  Rapid heartbeat.  Sensation of warmth in your chest. This usually passes quickly.  Allergic reaction to the tracer. What happens before the procedure?  Ask your health care provider about changing or stopping your regular medicines. This is especially  important if you are taking diabetes medicines or blood thinners.  Follow instructions from your health care provider about eating or drinking restrictions.  Remove your jewelry on the day of the procedure. What happens during the procedure?  An IV will be inserted into one of your veins.  Your health care provider will inject a small amount of radioactive tracer through the IV.  You will wait for 20-40 minutes while the tracer travels through your bloodstream.  Your heart activity will be monitored with an electrocardiogram (ECG).  You will lie down on an exam table.  Images of your heart will be taken for about 15-20 minutes.  You may also have a stress test. For this test, one of the following may be done: ? You will exercise on a treadmill or stationary bike. While you exercise, your heart's activity will be monitored with an ECG, and your blood pressure will be checked. ? You will be given medicines that will increase blood flow to parts of your heart. This is done if you are unable to exercise.  When blood flow to your heart has peaked, a tracer will again be injected through the IV.  After 20-40 minutes, you will get back on the exam table and have more images taken of your heart.  Depending on the type of tracer used, scans may need to be repeated 3-4 hours later.  Your IV line will be removed when the procedure is over. The procedure may vary among health care providers and hospitals. What happens after the procedure?  Unless your health care provider tells you otherwise, you may return to your normal schedule, including diet, activities, and medicines.  Unless your health care provider tells you otherwise, you may increase your fluid intake. This will help to flush the contrast dye from your body. Drink enough fluid to keep your urine pale yellow.  Ask your health care provider, or the department that is doing the test: ? When will my results be ready? ? How will I  get my results? Summary  A cardiac nuclear scan measures the blood flow to the heart when a person is resting and when he or she is exercising.  Tell your health care provider if you are pregnant.  Before the procedure, ask your health care provider about changing or stopping your regular medicines. This is especially important if you are taking diabetes medicines or blood thinners.  After the procedure, unless your health care provider tells you otherwise, increase your fluid intake. This will help flush the contrast dye from your body.  After the procedure, unless your health care provider tells you otherwise, you may return to your normal schedule, including diet, activities, and medicines. This information is not intended to replace advice given to you by your health care provider. Make sure you discuss any questions you have with your health care provider. Document Revised: 03/29/2018 Document Reviewed: 03/29/2018 Elsevier Patient Education  [place].    Echocardiogram An echocardiogram is a procedure that uses painless sound waves (ultrasound) to produce an image of the heart. Images from an echocardiogram can provide important information about:  Signs of coronary artery disease (CAD).  Aneurysm detection. An aneurysm is a weak or damaged part of an artery wall that bulges out from the normal force of blood pumping through the body.  Heart size and shape. Changes in the size or shape of the heart can be associated with certain conditions, including heart failure, aneurysm, and CAD.  Heart muscle function.  Heart valve function.  Signs of a past heart attack.  Fluid buildup around the heart.  Thickening of the heart muscle.  A tumor or infectious growth around the heart valves. Tell a health care provider about:  Any allergies you have.  All medicines you are taking, including vitamins, herbs, eye drops, creams, and over-the-counter medicines.  Any blood  disorders you have.  Any surgeries you have had.  Any medical conditions you have.  Whether you are pregnant or may be pregnant. What are the risks? Generally, this is a safe procedure. However, problems may occur, including:  Allergic reaction to dye (contrast) that may be used during the procedure. What happens before the procedure? No specific preparation is needed. You may eat and drink normally. What happens during the procedure?   An IV tube may be inserted into one of your veins.  You may receive contrast through this tube. A contrast is an injection that improves the quality of the pictures from your heart.  A gel will be applied to your chest.  A wand-like tool (transducer) will be moved over your chest. The gel will help to transmit the sound waves from the transducer.  The sound waves will harmlessly bounce off of your heart to allow the heart images to be captured in real-time motion. The images will be recorded on a computer. The procedure may vary among health care providers and hospitals. What happens after the procedure?  You may return to your normal, everyday life, including diet, activities, and medicines, unless your health care provider tells you not to do that. Summary  An echocardiogram is a procedure that uses painless sound waves (ultrasound) to produce an image of the heart.  Images from an echocardiogram can provide important information about the size and shape of your heart, heart muscle function, heart valve function, and fluid buildup around your heart.  You do not need to do anything to prepare before this procedure. You may eat and drink normally.  After the echocardiogram is completed, you may return to your normal, everyday life, unless your health care provider tells you not to do that. This information is not intended to replace advice given to you by your health care provider. Make sure you discuss any questions you have with your health  care provider. Document Revised: 02/03/2019 Document Reviewed: 11/15/2016 Elsevier Patient Education  [place].

## 2020-02-22 NOTE — Progress Notes (Signed)
Cardiology Office Note:    Date:  02/22/2020   ID:  Jennifer Melendez, DOB [birth year], MRN [id number]  PCP:  Robyne Peers, MD  Cardiologist:  Berniece Salines, DO  Electrophysiologist:  None   Referring MD: Hayden Rasmussen, MD   Chief Complaint  Patient presents with  . New Patient (Initial Visit)    History of Present Illness:    Jennifer Melendez is a 79 y.o. female with a hx of hypertension, hyperlipidemia, family history of coronary artery disease, PVCs and PACs presents today to be evaluated for palpitations and chest pain.  The patient tells me that she has for a long time continue to experience heart fluttering and skipped beats.  She notes that she initially experienced this after she was given losartan and started to feel significant palpitations and this medication was then stopped.  Ever since that time she has not gone completely back to her baseline.  This is bothersome.  Was really more concerning and the fact that she is significantly short of breath and she is not able to do most of her activity without being heavily winded.  She even stopped her grocery shopping due to this fact.  In addition she tells me that she has been experiencing some midsternal chest pain which she describes as a very dull sensation that is midsternal and sometimes last for less than 5 minutes on onset.  Nothing makes it better or worse. No other complaints at this time.  Past Medical History:  Diagnosis Date  . Acute sphenoidal sinusitis 06/28/2014   Last Assessment & Plan:  Formatting of this note might be different from the original. Pt will continue nasal steroid. Z pack sent to pharmacy.  Marland Kitchen Axillary fullness 04/06/2017  . Barrett's esophagus with esophagitis 07/04/2015  . Cancer ([place])   . Chronic fatigue 06/28/2014   Last Assessment & Plan:  Formatting of this note might be different from the original. Psychological condition is worsening. May need to do sleep study to r/o OSA check labs today  Psychological condition  will be reassessed in 3 months.  . Constipation   . Fibromyalgia   . Gastritis 10/07/2016  . Hx of bilateral breast implants 04/06/2017  . Hyperlipemia 05/26/2014   Last Assessment & Plan:  Formatting of this note might be different from the original. Lipid abnormalities are unchanged. Pt off cholesterol medicine Nutritional counseling was provided. and Pharmacotherapy as ordered. Lipids will be reassessed in 6 months. Formatting of this note might be different from the original. Last Assessment & Plan:  Lipid abnormalities are unchanged. Pt off cholesterol m  . Hyperlipidemia   . Hypertension 05/26/2014   Last Assessment & Plan:  Formatting of this note might be different from the original. Hypertension is improving with treatment. Continue current treatment regimen. Dietary sodium restriction. Regular aerobic exercise. Blood pressure will be reassessed in 4 months. Formatting of this note might be different from the original. Last Assessment & Plan:  Hypertension is improving with treatment. Conti  . 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 Headaches are worsening. Further diagnostic evaluation per orders. Pt needs brain MRI  . Meningioma ([place]) 05/26/2014   Last Assessment & Plan:  Formatting of this note might be different from the original. Pt needs f/u brain MRI  . Otalgia of left ear 04/16/2016  . Other chest pain 06/10/2019  . Palpitations   . Preventative health care  08/14/2015  . PVC (premature ventricular contraction)   . PVC's (premature ventricular contractions) 06/10/2019  . Seasonal allergies 05/26/2014   Last Assessment & Plan:  Formatting of this note might be different from the original. Pt taking OTC allergy med Formatting of this note might be different from the original. Last Assessment & Plan:  Pt taking OTC allergy med  . 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 BRAIN SURGERY    . CHOLECYSTECTOMY      Current Medications: Current Meds  Medication Sig  . acetaminophen (TYLENOL) 325 MG tablet Take 650 mg by mouth every 6 (six) hours as needed for moderate pain.  . calcium-vitamin D (OSCAL WITH D) 500-200 MG-UNIT tablet Take 1 tablet by mouth daily.  Marland Kitchen dicyclomine (BENTYL) 10 MG capsule Take 10 mg by mouth 2 (two) times daily.  . pregabalin (LYRICA) 100 MG capsule Take 3 capsules every day as directed by physician.  . TOPROL XL 50 MG 24 hr tablet Take 50 mg by mouth daily.  Marland Kitchen triamterene-hydrochlorothiazide (MAXZIDE-25) 37.5-25 MG tablet Take 1 tablet by mouth daily.  . vitamin B-12 (CYANOCOBALAMIN) 1000 MCG tablet Take 1,000 mcg by mouth daily.     Allergies:   Hydrocodone-acetaminophen, Morphine, Morpholine salicylate, Prednisolone, Prednisone, Gabapentin, and Ofloxacin   Social History   Socioeconomic History  . Marital status: Married    Spouse name: Not on file  . Number of children: Not on file  . Years of education: Not on file  . Highest education level: Not on file  Occupational History  . Not on file  Tobacco Use  . Smoking status: Never Smoker  . Smokeless tobacco: Never Used  Substance and Sexual Activity  . Alcohol use: Never  . Drug use: Never  . Sexual activity: Not on file  Other Topics Concern  . Not on file  Social History Narrative  . Not on file   Social Determinants of Health   Financial Resource Strain:   . Difficulty of Paying Living Expenses:   Food Insecurity:   . Worried About Charity fundraiser in the Last Year:   . Arboriculturist in the Last Year:   Transportation Needs:   . Film/video editor (Medical):   Marland Kitchen Lack of Transportation (Non-Medical):   Physical Activity:   . Days of Exercise per Week:   . Minutes of Exercise per Session:   Stress:   . Feeling of Stress :   Social Connections:   . Frequency of Communication with Friends and  Family:   . Frequency of Social Gatherings with Friends and Family:   . Attends Religious Services:   . Active Member of Clubs or Organizations:   . Attends Archivist Meetings:   Marland Kitchen Marital Status:      Family History: The patient's family history is not on file.  ROS:   Review of Systems  Constitution: Negative for decreased appetite, fever and weight gain.  HENT: Negative for congestion, ear discharge, hoarse voice and sore throat.   Eyes: Negative for discharge, redness, vision loss in right eye and visual halos.  Cardiovascular: Reports for chest pain, dyspnea on exertion, leg swelling, and palpitations.  Respiratory: Negative for cough, hemoptysis, shortness of breath and snoring.   Endocrine: Negative for heat intolerance and polyphagia.  Hematologic/Lymphatic: Negative for bleeding problem. Does not bruise/bleed easily.  Skin: Negative for flushing, nail changes, rash and suspicious lesions.  Musculoskeletal:  Negative for arthritis, joint pain, muscle cramps, myalgias, neck pain and stiffness.  Gastrointestinal: Negative for abdominal pain, bowel incontinence, diarrhea and excessive appetite.  Genitourinary: Negative for decreased libido, genital sores and incomplete emptying.  Neurological: Negative for brief paralysis, focal weakness, headaches and loss of balance.  Psychiatric/Behavioral: Negative for altered mental status, depression and suicidal ideas.  Allergic/Immunologic: Negative for HIV exposure and persistent infections.    EKGs/Labs/Other Studies Reviewed:    The following studies were reviewed today:    EKG:  The ekg ordered today demonstrates sinus rhythm, heart rate 80 bpm with occasional PACs.  Chest x-ray done on April 20th 2021  FINDINGS: Normal heart size, mediastinal contours, and pulmonary vascularity.  Atherosclerotic calcification aorta.  Lungs clear.  No acute infiltrate, pleural effusion or pneumothorax.  Diffuse osseous  demineralization with scattered endplate spur formation thoracic spine.  Capsular calcification at BILATERAL breast prostheses.  IMPRESSION: No acute abnormalities.  Aortic atherosclerosis.   Recent Labs: 05/31/2019: ALT 12 02/14/2020: BUN 11; Creatinine, Ser 0.97; Hemoglobin 10.1; Magnesium 2.1; Platelets 352; Potassium 4.0; Sodium 136  Recent Lipid Panel No results found for: CHOL, TRIG, HDL, CHOLHDL, VLDL, LDLCALC, LDLDIRECT  Physical Exam:    VS:  BP 130/62   Pulse 80   Ht 5\' 7"  (1.702 m)   Wt 194 lb (88 kg)   BMI 30.38 kg/m     Wt Readings from Last 3 Encounters:  02/22/20 194 lb (88 kg)  02/14/20 190 lb (86.2 kg)  06/10/19 191 lb (86.6 kg)     GEN: Well nourished, well developed in no acute distress HEENT: Normal NECK: No JVD; No carotid bruits LYMPHATICS: No lymphadenopathy CARDIAC: S1S2 noted,RRR, no murmurs, rubs, gallops RESPIRATORY:  Clear to auscultation without rales, wheezing or rhonchi  ABDOMEN: Soft, non-tender, non-distended, +bowel sounds, no guarding. EXTREMITIES: No edema, No cyanosis, no clubbing MUSCULOSKELETAL:  No deformity  SKIN: Warm and dry NEUROLOGIC:  Alert and oriented x 3, non-focal PSYCHIATRIC:  Normal affect, good insight  ASSESSMENT:    1. Chest pain of uncertain etiology   2. Shortness of breath   3. Palpitations   4. Essential hypertension    PLAN:     I reviewed her lab work from her recent emergency room visit as well as her EKG. there is evidence of premature atrial complex no evidence of atrial fibrillation.  I am going to continue the patient on her Toprol-XL as she has felt better recently.  In addition I will place a ZIO monitor the patient to understand her PAC burden and to make sure that she is not converting into any other atrial arrhythmias.  In addition we will get a transthoracic echocardiogram to assess for LV/RV function and any other structural abnormalities in the setting of her significant shortness of  breath.  In terms of her chest pain she does have risk factors for coronary artery disease.  I like to pursue an ischemic evaluation in this patient.  She prefers an exercise nuclear stress test.  I have educated her and her daughter about this testing she is willing to proceed with this.  The patient is in agreement with the above plan. The patient left the office in stable condition.  The patient will follow up in 3 months or sooner if needed.    Medication Adjustments/Labs and Tests Ordered: Current medicines are reviewed at length with the patient today.  Concerns regarding medicines are outlined above.  Orders Placed This Encounter  Procedures  . LONG TERM MONITOR (  3-14 DAYS)  . MYOCARDIAL PERFUSION IMAGING  . EKG 12-Lead  . ECHOCARDIOGRAM COMPLETE   No orders of the defined types were placed in this encounter.   Patient Instructions  Medication Instructions:  Your physician recommends that you continue on your current medications as directed. Please refer to the Current Medication list given to you today.  *If you need a refill on your cardiac medications before your next appointment, please call your pharmacy*   Lab Work: None.  If you have labs (blood work) drawn today and your tests are completely normal, you will receive your results only by: Marland Kitchen MyChart Message (if you have MyChart) OR . A paper copy in the mail If you have any lab test that is abnormal or we need to change your treatment, we will call you to review the results.   Testing/Procedures: Your physician has requested that you have an echocardiogram. Echocardiography is a painless test that uses sound waves to create images of your heart. It provides your doctor with information about the size and shape of your heart and how well your heart's chambers and valves are working. This procedure takes approximately one hour. There are no restrictions for this procedure.  A zio monitor was ordered today. It will  remain on for 14 days. You will then return monitor and event diary in provided box. It takes 1-2 weeks for report to be downloaded and returned to [place]. We will call you with the results. If monitor falls off or has orange flashing light, please call Zio for further instructions.     [facility] at [facility] [address] [place], [place] [address] Phone: [phone]    Please arrive 15 minutes prior to your appointment time for registration and insurance purposes.  The test will take approximately 3 to 4 hours to complete; you may bring reading material.  If someone comes with you to your appointment, they will need to remain in the main lobby due to limited space in the testing area. **If you are pregnant or breastfeeding, please notify the nuclear lab prior to your appointment**  How to prepare for your Myocardial Perfusion Test: . Do not eat or drink 3 hours prior to your test, except you may have water. . Do not consume products containing caffeine (regular or decaffeinated) 12 hours prior to your test. (ex: coffee, chocolate, sodas, tea). Do bring a list of your current medications with you.  If not listed below, you may take your medications as normal. . Do not take metoprolol (Lopressor, Toprol) for 24 hours prior to the test.  Bring the medication to your appointment as you may be required to take it once the test is complete. . Do wear comfortable clothes (no dresses or overalls) and walking shoes, tennis shoes preferred (No heels or open toe shoes are allowed). . Do NOT wear cologne, perfume, aftershave, or lotions (deodorant is allowed). . If these instructions are not followed, your test will have to be rescheduled.  Please report to [address] for your test.  If you have questions or concerns about your appointment, you can call the Nuclear Lab at [phone].  If you cannot keep your appointment, please provide  24 hours notification to the Nuclear Lab, to avoid a possible $50 charge to your account.    Follow-Up: At [facility], you and your health needs are our priority.  As part of our continuing mission to provide you with  exceptional heart care, we have created designated Provider Care Teams.  These Care Teams include your primary Cardiologist (physician) and Advanced Practice Providers (APPs -  Physician Assistants and Nurse Practitioners) who all work together to provide you with the care you need, when you need it.  We recommend signing up for the patient portal called "MyChart".  Sign up information is provided on this After Visit Summary.  MyChart is used to connect with patients for Virtual Visits (Telemedicine).  Patients are able to view lab/test results, encounter notes, upcoming appointments, etc.  Non-urgent messages can be sent to your provider as well.   To learn more about what you can do with MyChart, go to [email or web address].    Your next appointment:   3 month(s)  The format for your next appointment:   In Person  Provider:   Berniece Salines, DO   Other Instructions   Cardiac Nuclear Scan A cardiac nuclear scan is a test that measures blood flow to the heart when a person is resting and when he or she is exercising. The test looks for problems such as:  Not enough blood reaching a portion of the heart.  The heart muscle not working normally. You may need this test if:  You have heart disease.  You have had abnormal lab results.  You have had heart surgery or a balloon procedure to open up blocked arteries (angioplasty).  You have chest pain.  You have shortness of breath. In this test, a radioactive dye (tracer) is injected into your bloodstream. After the tracer has traveled to your heart, an imaging device is used to measure how much of the tracer is absorbed by or distributed to various areas of your heart. This procedure is usually done at a hospital and  takes 2-4 hours. Tell a health care provider about:  Any allergies you have.  All medicines you are taking, including vitamins, herbs, eye drops, creams, and over-the-counter medicines.  Any problems you or family members have had with anesthetic medicines.  Any blood disorders you have.  Any surgeries you have had.  Any medical conditions you have.  Whether you are pregnant or may be pregnant. What are the risks? Generally, this is a safe procedure. However, problems may occur, including:  Serious chest pain and heart attack. This is only a risk if the stress portion of the test is done.  Rapid heartbeat.  Sensation of warmth in your chest. This usually passes quickly.  Allergic reaction to the tracer. What happens before the procedure?  Ask your health care provider about changing or stopping your regular medicines. This is especially important if you are taking diabetes medicines or blood thinners.  Follow instructions from your health care provider about eating or drinking restrictions.  Remove your jewelry on the day of the procedure. What happens during the procedure?  An IV will be inserted into one of your veins.  Your health care provider will inject a small amount of radioactive tracer through the IV.  You will wait for 20-40 minutes while the tracer travels through your bloodstream.  Your heart activity will be monitored with an electrocardiogram (ECG).  You will lie down on an exam table.  Images of your heart will be taken for about 15-20 minutes.  You may also have a stress test. For this test, one of the following may be done: ? You will exercise on a treadmill or stationary bike. While you exercise, your heart's activity will be monitored with an  ECG, and your blood pressure will be checked. ? You will be given medicines that will increase blood flow to parts of your heart. This is done if you are unable to exercise.  When blood flow to your heart  has peaked, a tracer will again be injected through the IV.  After 20-40 minutes, you will get back on the exam table and have more images taken of your heart.  Depending on the type of tracer used, scans may need to be repeated 3-4 hours later.  Your IV line will be removed when the procedure is over. The procedure may vary among health care providers and hospitals. What happens after the procedure?  Unless your health care provider tells you otherwise, you may return to your normal schedule, including diet, activities, and medicines.  Unless your health care provider tells you otherwise, you may increase your fluid intake. This will help to flush the contrast dye from your body. Drink enough fluid to keep your urine pale yellow.  Ask your health care provider, or the department that is doing the test: ? When will my results be ready? ? How will I get my results? Summary  A cardiac nuclear scan measures the blood flow to the heart when a person is resting and when he or she is exercising.  Tell your health care provider if you are pregnant.  Before the procedure, ask your health care provider about changing or stopping your regular medicines. This is especially important if you are taking diabetes medicines or blood thinners.  After the procedure, unless your health care provider tells you otherwise, increase your fluid intake. This will help flush the contrast dye from your body.  After the procedure, unless your health care provider tells you otherwise, you may return to your normal schedule, including diet, activities, and medicines. This information is not intended to replace advice given to you by your health care provider. Make sure you discuss any questions you have with your health care provider. Document Revised: 03/29/2018 Document Reviewed: 03/29/2018 Elsevier Patient Education  [place].    Echocardiogram An echocardiogram is a procedure that uses painless  sound waves (ultrasound) to produce an image of the heart. Images from an echocardiogram can provide important information about:  Signs of coronary artery disease (CAD).  Aneurysm detection. An aneurysm is a weak or damaged part of an artery wall that bulges out from the normal force of blood pumping through the body.  Heart size and shape. Changes in the size or shape of the heart can be associated with certain conditions, including heart failure, aneurysm, and CAD.  Heart muscle function.  Heart valve function.  Signs of a past heart attack.  Fluid buildup around the heart.  Thickening of the heart muscle.  A tumor or infectious growth around the heart valves. Tell a health care provider about:  Any allergies you have.  All medicines you are taking, including vitamins, herbs, eye drops, creams, and over-the-counter medicines.  Any blood disorders you have.  Any surgeries you have had.  Any medical conditions you have.  Whether you are pregnant or may be pregnant. What are the risks? Generally, this is a safe procedure. However, problems may occur, including:  Allergic reaction to dye (contrast) that may be used during the procedure. What happens before the procedure? No specific preparation is needed. You may eat and drink normally. What happens during the procedure?   An IV tube may be inserted into one of your  veins.  You may receive contrast through this tube. A contrast is an injection that improves the quality of the pictures from your heart.  A gel will be applied to your chest.  A wand-like tool (transducer) will be moved over your chest. The gel will help to transmit the sound waves from the transducer.  The sound waves will harmlessly bounce off of your heart to allow the heart images to be captured in real-time motion. The images will be recorded on a computer. The procedure may vary among health care providers and hospitals. What happens after the  procedure?  You may return to your normal, everyday life, including diet, activities, and medicines, unless your health care provider tells you not to do that. Summary  An echocardiogram is a procedure that uses painless sound waves (ultrasound) to produce an image of the heart.  Images from an echocardiogram can provide important information about the size and shape of your heart, heart muscle function, heart valve function, and fluid buildup around your heart.  You do not need to do anything to prepare before this procedure. You may eat and drink normally.  After the echocardiogram is completed, you may return to your normal, everyday life, unless your health care provider tells you not to do that. This information is not intended to replace advice given to you by your health care provider. Make sure you discuss any questions you have with your health care provider. Document Revised: 02/03/2019 Document Reviewed: 11/15/2016 Elsevier Patient Education  [place].      Adopting a Healthy Lifestyle.  Know what a healthy weight is for you (roughly BMI <25) and aim to maintain this   Aim for 7+ servings of fruits and vegetables daily   65-80+ fluid ounces of water or unsweet tea for healthy kidneys   Limit to max 1 drink of alcohol per day; avoid smoking/tobacco   Limit animal fats in diet for cholesterol and heart health - choose grass fed whenever available   Avoid highly processed foods, and foods high in saturated/trans fats   Aim for low stress - take time to unwind and care for your mental health   Aim for 150 min of moderate intensity exercise weekly for heart health, and weights twice weekly for bone health   Aim for 7-9 hours of sleep daily   When it comes to diets, agreement about the perfect plan isnt easy to find, even among the experts. Experts at the [employer] developed an idea known as the Healthy Eating Plate. Just imagine a plate  divided into logical, healthy portions.   The emphasis is on diet quality:   Load up on vegetables and fruits - one-half of your plate: Aim for color and variety, and remember that potatoes dont count.   Go for whole grains - one-quarter of your plate: Whole wheat, barley, wheat berries, quinoa, oats, brown rice, and foods made with them. If you want pasta, go with whole wheat pasta.   Protein power - one-quarter of your plate: Fish, chicken, beans, and nuts are all healthy, versatile protein sources. Limit red meat.   The diet, however, does go beyond the plate, offering a few other suggestions.   Use healthy plant oils, such as olive, canola, soy, corn, sunflower and peanut. Check the labels, and avoid partially hydrogenated oil, which have unhealthy trans fats.   If youre thirsty, drink water. Coffee and tea are good in moderation, but skip sugary drinks and  limit milk and dairy products to one or two daily servings.   The type of carbohydrate in the diet is more important than the amount. Some sources of carbohydrates, such as vegetables, fruits, whole grains, and beans-are healthier than others.   Finally, stay active  Signed, Berniece Salines, DO  02/22/2020 1:23 PM    [facility]

## 2020-02-23 ENCOUNTER — Telehealth (HOSPITAL_COMMUNITY): Payer: Self-pay

## 2020-02-23 NOTE — Telephone Encounter (Signed)
Spoke with the patient, detailed instructions given. She stated that she couldn't walk the treadmill but would try Lexiscan. Asked to call back with any questions. S.Jennifer Melendez EMTP

## 2020-02-24 ENCOUNTER — Other Ambulatory Visit (HOSPITAL_COMMUNITY)
Admission: RE | Admit: 2020-02-24 | Discharge: 2020-02-24 | Disposition: A | Discharge status: Home / Self Care | Payer: Medicare Other | Source: Ambulatory Visit | Attending: Cardiology | Admitting: Cardiology

## 2020-02-24 ENCOUNTER — Other Ambulatory Visit (HOSPITAL_COMMUNITY): Payer: Medicare Other

## 2020-02-24 DIAGNOSIS — Z20822 Contact with and (suspected) exposure to covid-19: Secondary | ICD-10-CM | POA: Diagnosis not present

## 2020-02-24 DIAGNOSIS — Z01812 Encounter for preprocedural laboratory examination: Secondary | ICD-10-CM | POA: Diagnosis present

## 2020-02-24 LAB — SARS CORONAVIRUS 2 (TAT 6-24 HRS): SARS Coronavirus 2: NEGATIVE

## 2020-02-27 ENCOUNTER — Telehealth: Payer: Self-pay

## 2020-02-27 NOTE — Telephone Encounter (Signed)
Spoke with patient regarding results.  Patient verbalizes understanding and is agreeable to plan of care. Advised patient to call back with any issues or concerns.  

## 2020-02-27 NOTE — Telephone Encounter (Signed)
-----   Message from Berniece Salines, DO sent at 02/27/2020 12:46 PM EDT ----- Normal covid 19 test result

## 2020-02-28 ENCOUNTER — Telehealth: Payer: Self-pay

## 2020-02-28 ENCOUNTER — Other Ambulatory Visit: Payer: Self-pay

## 2020-02-28 ENCOUNTER — Ambulatory Visit (HOSPITAL_BASED_OUTPATIENT_CLINIC_OR_DEPARTMENT_OTHER): Payer: Medicare Other

## 2020-02-28 VITALS — Ht 67.0 in | Wt 194.0 lb

## 2020-02-28 DIAGNOSIS — R002 Palpitations: Secondary | ICD-10-CM | POA: Diagnosis not present

## 2020-02-28 DIAGNOSIS — I493 Ventricular premature depolarization: Secondary | ICD-10-CM

## 2020-02-28 DIAGNOSIS — R079 Chest pain, unspecified: Secondary | ICD-10-CM | POA: Diagnosis not present

## 2020-02-28 DIAGNOSIS — R0602 Shortness of breath: Secondary | ICD-10-CM

## 2020-02-28 LAB — MYOCARDIAL PERFUSION IMAGING
LV dias vol: 50 mL (ref 46–106)
LV sys vol: 11 mL
Peak HR: 104 {beats}/min
Rest HR: 83 {beats}/min
SDS: 2
SRS: 4
SSS: 6
TID: 0.96

## 2020-02-28 MED ORDER — REGADENOSON 0.4 MG/5ML IV SOLN
0.4000 mg | Freq: Once | INTRAVENOUS | Status: AC
  Administered 2020-02-28: 0.4 mg via INTRAVENOUS

## 2020-02-28 MED ORDER — TECHNETIUM TC 99M TETROFOSMIN IV KIT
10.9000 | PACK | Freq: Once | INTRAVENOUS | Status: AC | PRN
  Administered 2020-02-28: 10.9 via INTRAVENOUS
  Filled 2020-02-28: qty 11

## 2020-02-28 MED ORDER — TECHNETIUM TC 99M TETROFOSMIN IV KIT
32.7000 | PACK | Freq: Once | INTRAVENOUS | Status: AC | PRN
  Administered 2020-02-28: 32.7 via INTRAVENOUS
  Filled 2020-02-28: qty 33

## 2020-02-28 NOTE — Telephone Encounter (Signed)
-----   Message from Berniece Salines, DO sent at 02/28/2020  3:21 PM EDT ----- Normal stress test.

## 2020-02-28 NOTE — Telephone Encounter (Signed)
Spoke with patient regarding results.  Patient verbalizes understanding and is agreeable to plan of care. Advised patient to call back with any issues or concerns.  

## 2020-02-29 ENCOUNTER — Ambulatory Visit (HOSPITAL_BASED_OUTPATIENT_CLINIC_OR_DEPARTMENT_OTHER)
Admission: RE | Admit: 2020-02-29 | Discharge: 2020-02-29 | Disposition: A | Discharge status: Home / Self Care | Payer: Medicare Other | Source: Ambulatory Visit | Attending: Cardiology | Admitting: Cardiology

## 2020-02-29 DIAGNOSIS — R0602 Shortness of breath: Secondary | ICD-10-CM

## 2020-02-29 DIAGNOSIS — R079 Chest pain, unspecified: Secondary | ICD-10-CM | POA: Diagnosis not present

## 2020-02-29 DIAGNOSIS — R002 Palpitations: Secondary | ICD-10-CM

## 2020-02-29 DIAGNOSIS — I493 Ventricular premature depolarization: Secondary | ICD-10-CM | POA: Insufficient documentation

## 2020-02-29 MED ORDER — PERFLUTREN LIPID MICROSPHERE
1.0000 mL | INTRAVENOUS | Status: AC | PRN
  Administered 2020-02-29: 3 mL via INTRAVENOUS
  Filled 2020-02-29: qty 10

## 2020-02-29 NOTE — Progress Notes (Signed)
  Echocardiogram 2D Echocardiogram has been performed.  Jennifer Melendez 02/29/2020, 4:07 PM

## 2020-03-05 ENCOUNTER — Telehealth: Payer: Self-pay | Admitting: Cardiology

## 2020-03-05 NOTE — Telephone Encounter (Signed)
New message  Patient is calling in to get echo results. Please give patient a call back to discuss.

## 2020-03-06 NOTE — Telephone Encounter (Signed)
Echo showed normal EF.  Pressures on the right side elevated pulmonary hypertension.  We will discuss it in more detail at her next visit.

## 2020-03-06 NOTE — Telephone Encounter (Signed)
She needs to schedule her echocardiogram.

## 2020-03-06 NOTE — Telephone Encounter (Signed)
Results reviewed with pt as per Dr. Terrial Rhodes note.  Pt verbalized understanding and had no additional questions.

## 2020-03-06 NOTE — Telephone Encounter (Signed)
The echo was completed on 02/29/20. I see the result but no note from you.

## 2020-03-06 NOTE — Telephone Encounter (Signed)
Pt wants to know the results of her echo however, I do not see a review on the result.

## 2020-03-07 ENCOUNTER — Telehealth: Payer: Self-pay

## 2020-03-07 NOTE — Telephone Encounter (Signed)
-----   Message from Berniece Salines, DO sent at 03/07/2020  2:11 PM EDT ----- The echo showed that the heart is not fully relaxing like it should ( diastolic dysfunction) ,but otherwise normal.  There is also mildly right-sided heart pressure I will discuss it at the next office visit.

## 2020-03-07 NOTE — Telephone Encounter (Signed)
Spoke with patient regarding results and recommendation.  Patient verbalizes understanding and is agreeable to plan of care. Advised patient to call back with any issues or concerns.  

## 2020-03-24 DIAGNOSIS — R0602 Shortness of breath: Secondary | ICD-10-CM | POA: Diagnosis not present

## 2020-03-24 DIAGNOSIS — R002 Palpitations: Secondary | ICD-10-CM | POA: Diagnosis not present

## 2020-03-27 ENCOUNTER — Telehealth: Payer: Self-pay | Admitting: Cardiology

## 2020-03-27 NOTE — Telephone Encounter (Signed)
Patient returning call for monitor results. 

## 2020-03-27 NOTE — Telephone Encounter (Signed)
Called patient informed her of monitor results and Dr. Terrial Rhodes recommendation to stop metoprolol and start Cardizem she doesn't not wish to change medications at this time as the last time her medication was adjusted she reports it almost killed her. She wants to think about this and she will let us know if she changes her mind. Will inform Dr. Harriet Masson.

## 2020-03-28 ENCOUNTER — Telehealth: Payer: Self-pay

## 2020-03-28 MED ORDER — DILTIAZEM HCL ER COATED BEADS 180 MG PO CP24: 180.0000 mg | ORAL_CAPSULE | Freq: Every day | ORAL | 3 refills | Status: DC

## 2020-03-28 NOTE — Telephone Encounter (Signed)
Spoke to the patient just now and let her know that Dr. Terrial Rhodes recommendation was for her to stop the metoprolol and to start the Cardizem 180 mg daily. She verbalizes understanding of this and states that she will do this. She does not have any other issues or concerns at this time.    Encouraged patient to call back with any questions or concerns.

## 2020-03-28 NOTE — Telephone Encounter (Signed)
Follow up  Pt called back she said she thought about the Diltiazem and she decided to try it. She said to call it in and get her 90 day supplies.   Please advise

## 2020-06-05 ENCOUNTER — Ambulatory Visit: Payer: Medicare Other | Admitting: Cardiology

## 2020-07-04 ENCOUNTER — Ambulatory Visit: Payer: Medicare Other | Admitting: Cardiology

## 2020-07-04 ENCOUNTER — Encounter: Payer: Self-pay | Admitting: Cardiology

## 2020-07-04 ENCOUNTER — Other Ambulatory Visit: Payer: Self-pay

## 2020-07-04 VITALS — BP 148/72 | HR 80 | Ht 67.5 in | Wt 193.1 lb

## 2020-07-04 DIAGNOSIS — I493 Ventricular premature depolarization: Secondary | ICD-10-CM

## 2020-07-04 DIAGNOSIS — I471 Supraventricular tachycardia: Secondary | ICD-10-CM

## 2020-07-04 DIAGNOSIS — I10 Essential (primary) hypertension: Secondary | ICD-10-CM | POA: Diagnosis not present

## 2020-07-04 DIAGNOSIS — I491 Atrial premature depolarization: Secondary | ICD-10-CM

## 2020-07-04 DIAGNOSIS — I4719 Other supraventricular tachycardia: Secondary | ICD-10-CM

## 2020-07-04 HISTORY — DX: Atrial premature depolarization: I49.1

## 2020-07-04 HISTORY — DX: Supraventricular tachycardia: I47.1

## 2020-07-04 HISTORY — DX: Other supraventricular tachycardia: I47.19

## 2020-07-04 NOTE — Patient Instructions (Signed)

## 2020-07-04 NOTE — Progress Notes (Signed)
Cardiology Office Note:    Date:  07/04/2020   ID:  Jennifer Melendez, DOB 05-11-1941, MRN [id number]  PCP:  Robyne Peers, MD  Cardiologist:  Berniece Salines, DO  Electrophysiologist:  None   Referring MD: Robyne Peers, MD   I am doing fine  History of Present Illness:    Jennifer Melendez is a 79 y.o. female with a hx of hypertension, hyperlipidemia, PVCs and PACs, recently diagnosed paroxysmal atrial tachycardia on monitor.  The patient is here today for follow-up visit.  She tells me that she has been doing well from a cardiovascular standpoint.  She notes that she had a very bad experience with the Cardizem therefore this medication was stopped and she is back on her metoprolol.  She takes her blood pressure at home she tells me that is running between 213Y systolic blood pressure.  She denies any chest pain, shortness of breath.  She notes that she is very inactive due to the COVID-19 virus she does not exercise much.  Past Medical History:  Diagnosis Date   Acute sphenoidal sinusitis 06/28/2014   Last Assessment & Plan:  Formatting of this note might be different from the original. Pt will continue nasal steroid. Z pack sent to pharmacy.   Axillary fullness 04/06/2017   Barrett's esophagus with esophagitis 07/04/2015   Cancer (HCC)    Chronic fatigue 06/28/2014   Last Assessment & Plan:  Formatting of this note might be different from the original. Psychological condition is worsening. May need to do sleep study to r/o OSA check labs today Psychological condition  will be reassessed in 3 months.   Constipation    Fibromyalgia    Gastritis 10/07/2016   Hx of bilateral breast implants 04/06/2017   Hyperlipemia 05/26/2014   Last Assessment & Plan:  Formatting of this note might be different from the original. Lipid abnormalities are unchanged. Pt off cholesterol medicine Nutritional counseling was provided. and Pharmacotherapy as ordered. Lipids will be reassessed in 6  months. Formatting of this note might be different from the original. Last Assessment & Plan:  Lipid abnormalities are unchanged. Pt off cholesterol m   Hyperlipidemia    Hypertension 05/26/2014   Last Assessment & Plan:  Formatting of this note might be different from the original. Hypertension is improving with treatment. Continue current treatment regimen. Dietary sodium restriction. Regular aerobic exercise. Blood pressure will be reassessed in 4 months. Formatting of this note might be different from the original. Last Assessment & Plan:  Hypertension is improving with treatment. 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 Headaches are worsening. Further diagnostic evaluation per orders. Pt needs brain MRI   Meningioma ([place]) 05/26/2014   Last Assessment & Plan:  Formatting of this note might be different from the original. Pt needs f/u brain MRI   Otalgia of left ear 04/16/2016   Other chest pain 06/10/2019   Palpitations    Preventative health care 08/14/2015   PVC (premature ventricular contraction)    PVC's (premature ventricular contractions) 06/10/2019   Seasonal allergies 05/26/2014   Last Assessment & Plan:  Formatting of this note might be different from the original. Pt taking OTC allergy med Formatting of this note might be different from the original. 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   dicyclomine (BENTYL) 10 MG capsule Take 10 mg by mouth 2 (two) times daily.   metoprolol succinate (TOPROL-XL) 50 MG 24 hr tablet Take 50 mg by mouth daily.   pregabalin  (LYRICA) 100 MG capsule Take 3 capsules every day as directed by physician.   triamterene-hydrochlorothiazide (MAXZIDE-25) 37.5-25 MG tablet Take 1 tablet by mouth daily as needed.    vitamin B-12 (CYANOCOBALAMIN) 1000 MCG tablet Take 1,000 mcg by mouth daily.     Allergies:   Diltiazem hcl er, Hydrocodone-acetaminophen, Losartan potassium, Morphine, Morpholine salicylate, Prednisolone, Prednisone, Gabapentin, and Ofloxacin   Social History   Socioeconomic History   Marital status: Widowed    Spouse name: Not on file   Number of children: Not on file   Years of education: Not on file   Highest education level: Not on file  Occupational History   Not on file  Tobacco Use   Smoking status: Never Smoker   Smokeless tobacco: Never Used  Vaping Use   Vaping Use: Never used  Substance and Sexual Activity   Alcohol use: Never   Drug use: Never   Sexual activity: Not on file  Other Topics Concern   Not on file  Social History Narrative   Not on file   Social Determinants of Health   Financial Resource Strain:    Difficulty of Paying Living Expenses: Not on file  Food Insecurity:    Worried About [place] in the Last Year: Not on file   Ran Out of Food in the Last Year: Not on file  Transportation Needs:    Lack of Transportation (Medical): Not on file   Lack of Transportation (Non-Medical): Not on file  Physical Activity:    Days of Exercise per Week: Not on file   Minutes of Exercise per Session: Not on file  Stress:    Feeling of Stress : Not on file  Social Connections:    Frequency of Communication with Friends and Family: Not on file   Frequency of Social Gatherings with Friends and Family: Not on file   Attends Religious Services: Not on file   Active Member of Clubs or Organizations: Not on file   Attends Archivist Meetings: Not on file   Marital Status: Not on file     Family History: The patient's family  history is not on file.  ROS:   Review of Systems  Constitution: Negative for decreased appetite, fever and weight gain.  HENT: Negative for congestion, ear discharge, hoarse voice and sore throat.   Eyes: Negative for discharge, redness, vision loss in right eye and visual halos.  Cardiovascular: Negative for chest pain, dyspnea on exertion, leg swelling, orthopnea and palpitations.  Respiratory: Negative for cough, hemoptysis, shortness of breath and snoring.   Endocrine: Negative for heat intolerance and polyphagia.  Hematologic/Lymphatic: Negative for bleeding problem. Does not bruise/bleed easily.  Skin: Negative for flushing, nail changes, rash and suspicious lesions.  Musculoskeletal: Negative for arthritis, joint pain, muscle cramps, myalgias, neck pain and stiffness.  Gastrointestinal: Negative for abdominal pain, bowel incontinence, diarrhea and excessive appetite.  Genitourinary: Negative for decreased libido, genital sores and incomplete emptying.  Neurological: Negative for brief paralysis, focal weakness, headaches and loss of  balance.  Psychiatric/Behavioral: Negative for altered mental status, depression and suicidal ideas.  Allergic/Immunologic: Negative for HIV exposure and persistent infections.    EKGs/Labs/Other Studies Reviewed:    The following studies were reviewed today:   EKG: None today  Pharmacologic stress test  Nuclear stress EF: 78%.  There was no ST segment deviation noted during stress.  The study is normal.  This is a low risk study.  The left ventricular ejection fraction is hyperdynamic (>65%).   Normal stress nuclear study with no ischemia or infarction.  Gated ejection fraction 78% with normal wall motion.   Echo IMPRESSIONS Feb 29, 2020 1. Left ventricular ejection fraction, by estimation, is 60 to 65%. The left ventricle has normal function. The left ventricle has no regional wall motion abnormalities. There is mild concentric left  ventricular  hypertrophy. Left ventricular diastolic parameters are consistent with Grade I diastolic dysfunction (impaired relaxation).  2. Right ventricular systolic function is normal. The right ventricular size is normal. There is moderately elevated pulmonary artery systolic pressure. The estimated right ventricular systolic pressure is moderately elevated 54.0 mmHg.  3. The mitral valve is normal in structure. No evidence of mitral valve regurgitation. No evidence of mitral stenosis.  4. The aortic valve is tricuspid. Aortic valve regurgitation is not visualized. No aortic stenosis is present.  5. The inferior vena cava is normal in size with greater than 50% respiratory variability, suggesting right atrial pressure of 3 mmHg.   Zio monitor Mar 23, 2020 The patient wore the monitor for 12 days 21 hours starting February 14, 2020. Indication: Palpitations  The minimum heart rate was 55 bpm, maximum heart rate was 193 bpm, and average heart rate was 76 bpm. Predominant underlying rhythm was Sinus Rhythm.  45 Supraventricular Tachycardia runs occurred, the run with the fastest interval lasting 4 beats with a maximal rate of 193 bpm, the longest lasting 13.6 seconds with an average rate of 122 bpm.  Premature atrial complexes were occasional (4.3%,61832). Premature Ventricular complexes were rare (less than 1%).  No ventricular tachycardia, no pauses, No AV block and no atrial fibrillation present. 7 patient triggered events noted all associated premature atrial complexes.  7 diary events for associated with premature ventricular complexes and 3 associated with premature atrial complexes.  Conclusion: This study is remarkable for the following:                             1.  45 episodes of paroxysmal supraventricular tachycardia which is likely atrial tachycardia with variable block.                             2.  Symptomatic occasional premature atrial complexes.   Recent  Labs: 02/14/2020: BUN 11; Creatinine, Ser 0.97; Hemoglobin 10.1; Magnesium 2.1; Platelets 352; Potassium 4.0; Sodium 136  Recent Lipid Panel No results found for: CHOL, TRIG, HDL, CHOLHDL, VLDL, LDLCALC, LDLDIRECT  Physical Exam:    VS:  BP (!) 148/72    Pulse 80    Ht 5' 7.5" (1.715 m)    Wt 193 lb 1.3 oz (87.6 kg)    SpO2 99%    BMI 29.79 kg/m     Wt Readings from Last 3 Encounters:  07/04/20 193 lb 1.3 oz (87.6 kg)  02/28/20 194 lb (88 kg)  02/22/20 194 lb (88 kg)     GEN: Well nourished, well developed in no  acute distress HEENT: Normal NECK: No JVD; No carotid bruits LYMPHATICS: No lymphadenopathy CARDIAC: S1S2 noted,RRR, no murmurs, rubs, gallops RESPIRATORY:  Clear to auscultation without rales, wheezing or rhonchi  ABDOMEN: Soft, non-tender, non-distended, +bowel sounds, no guarding. EXTREMITIES: No edema, No cyanosis, no clubbing MUSCULOSKELETAL:  No deformity  SKIN: Warm and dry NEUROLOGIC:  Alert and oriented x 3, non-focal PSYCHIATRIC:  Normal affect, good insight  ASSESSMENT:    1. PAT (paroxysmal atrial tachycardia) ([place])   2. PAC (premature atrial contraction)   3. Essential hypertension   4. PVC's (premature ventricular contractions)    PLAN:     She appears to be doing well from a cardiovascular standpoint.  Unfortunately she did not tolerate diltiazem.  She is back on Toprol-XL.  She says this is helping with her symptoms.  I will continue the patient on this regimen.  Her blood pressure in the office today is slightly elevated 140/72 mmHg.  But she reports that at home she is mostly in the 130s.  I am not going to change her antihypertensive regimen because I want to avoid any hypotension.  She also tells me she is not taking her triamterene hydrochlorothiazide as prescribed she takes it when she feels like she has some feeling her leg.  The patient is in agreement with the above plan. The patient left the office in stable condition.  The patient will follow  up in 6 months or sooner if needed.   Medication Adjustments/Labs and Tests Ordered: Current medicines are reviewed at length with the patient today.  Concerns regarding medicines are outlined above.  No orders of the defined types were placed in this encounter.  No orders of the defined types were placed in this encounter.   Patient Instructions  Medication Instructions:  Your physician recommends that you continue on your current medications as directed. Please refer to the Current Medication list given to you today.  *If you need a refill on your cardiac medications before your next appointment, please call your pharmacy*   Lab Work: None If you have labs (blood work) drawn today and your tests are completely normal, you will receive your results only by:  [place] (if you have MyChart) OR  A paper copy in the mail If you have any lab test that is abnormal or we need to change your treatment, we will call you to review the results.   Testing/Procedures: None   Follow-Up: At [facility], you and your health needs are our priority.  As part of our continuing mission to provide you with exceptional heart care, we have created designated Provider Care Teams.  These Care Teams include your primary Cardiologist (physician) and Advanced Practice Providers (APPs -  Physician Assistants and Nurse Practitioners) who all work together to provide you with the care you need, when you need it.  We recommend signing up for the patient portal called "MyChart".  Sign up information is provided on this After Visit Summary.  MyChart is used to connect with patients for Virtual Visits (Telemedicine).  Patients are able to view lab/test results, encounter notes, upcoming appointments, etc.  Non-urgent messages can be sent to your provider as well.   To learn more about what you can do with MyChart, go to [email or web address].    Your next appointment:   6 month(s)  The format for  your next appointment:   In Person  Provider:   Berniece Salines, DO   Other Instructions      Adopting  a Healthy Lifestyle.  Know what a healthy weight is for you (roughly BMI <25) and aim to maintain this   Aim for 7+ servings of fruits and vegetables daily   65-80+ fluid ounces of water or unsweet tea for healthy kidneys   Limit to max 1 drink of alcohol per day; avoid smoking/tobacco   Limit animal fats in diet for cholesterol and heart health - choose grass fed whenever available   Avoid highly processed foods, and foods high in saturated/trans fats   Aim for low stress - take time to unwind and care for your mental health   Aim for 150 min of moderate intensity exercise weekly for heart health, and weights twice weekly for bone health   Aim for 7-9 hours of sleep daily   When it comes to diets, agreement about the perfect plan isnt easy to find, even among the experts. Experts at the [employer] developed an idea known as the Healthy Eating Plate. Just imagine a plate divided into logical, healthy portions.   The emphasis is on diet quality:   Load up on vegetables and fruits - one-half of your plate: Aim for color and variety, and remember that potatoes dont count.   Go for whole grains - one-quarter of your plate: Whole wheat, barley, wheat berries, quinoa, oats, brown rice, and foods made with them. If you want pasta, go with whole wheat pasta.   Protein power - one-quarter of your plate: Fish, chicken, beans, and nuts are all healthy, versatile protein sources. Limit red meat.   The diet, however, does go beyond the plate, offering a few other suggestions.   Use healthy plant oils, such as olive, canola, soy, corn, sunflower and peanut. Check the labels, and avoid partially hydrogenated oil, which have unhealthy trans fats.   If youre thirsty, drink water. Coffee and tea are good in moderation, but skip sugary drinks and limit milk and dairy  products to one or two daily servings.   The type of carbohydrate in the diet is more important than the amount. Some sources of carbohydrates, such as vegetables, fruits, whole grains, and beans-are healthier than others.   Finally, stay active  Signed, Berniece Salines, DO  07/04/2020 2:54 PM    [facility]

## 2020-10-16 ENCOUNTER — Other Ambulatory Visit: Payer: Self-pay

## 2020-10-16 ENCOUNTER — Ambulatory Visit: Payer: Medicare Other | Admitting: Cardiology

## 2020-10-16 ENCOUNTER — Ambulatory Visit (INDEPENDENT_AMBULATORY_CARE_PROVIDER_SITE_OTHER): Payer: Medicare Other

## 2020-10-16 ENCOUNTER — Telehealth: Payer: Self-pay | Admitting: Cardiology

## 2020-10-16 ENCOUNTER — Encounter: Payer: Self-pay | Admitting: Cardiology

## 2020-10-16 VITALS — BP 138/76 | HR 95 | Ht 67.5 in | Wt 190.0 lb

## 2020-10-16 DIAGNOSIS — R002 Palpitations: Secondary | ICD-10-CM

## 2020-10-16 DIAGNOSIS — I471 Supraventricular tachycardia: Secondary | ICD-10-CM | POA: Diagnosis not present

## 2020-10-16 DIAGNOSIS — I10 Essential (primary) hypertension: Secondary | ICD-10-CM | POA: Diagnosis not present

## 2020-10-16 DIAGNOSIS — I493 Ventricular premature depolarization: Secondary | ICD-10-CM

## 2020-10-16 DIAGNOSIS — I491 Atrial premature depolarization: Secondary | ICD-10-CM

## 2020-10-16 MED ORDER — METOPROLOL SUCCINATE ER 25 MG PO TB24: 25.0000 mg | ORAL_TABLET | Freq: Every day | ORAL | 5 refills | Status: DC

## 2020-10-16 NOTE — Progress Notes (Signed)
b

## 2020-10-16 NOTE — Progress Notes (Signed)
Cardiology Office Note:    Date:  10/16/2020   ID:  Jennifer Melendez, DOB Oct 11, 1941, MRN [id number]  PCP:  Robyne Peers, MD  Cardiologist:  Berniece Salines, DO  Electrophysiologist:  None   Referring MD: Robyne Peers, MD   Chief Complaint  Patient presents with  . Rapid Heat beat post covid booster    History of Present Illness:    Jennifer Melendez is a 79 y.o. female with a hx of hypertension, hyperlipidemia, PVCs, PACs and paroxysmal atrial tachycardia.  The patient tells me that she recently got her Covid vaccine booster and ever since that time she has had worsening palpitations.  She notes that the heart rate at night is worsened and increased.  She is concerned.  She is here today with her daughter.  In addition she notes that her blood pressure at home has been slightly elevated no other complaints at this time.  Past Medical History:  Diagnosis Date  . Acute sphenoidal sinusitis 06/28/2014   Last Assessment & Plan:  Formatting of this note might be different from the original. Pt will continue nasal steroid. Z pack sent to pharmacy.  Marland Kitchen Axillary fullness 04/06/2017  . Barrett's esophagus with esophagitis 07/04/2015  . Cancer ([place])   . Chronic fatigue 06/28/2014   Last Assessment & Plan:  Formatting of this note might be different from the original. Psychological condition is worsening. May need to do sleep study to r/o OSA check labs today Psychological condition  will be reassessed in 3 months.  . Constipation   . Fibromyalgia   . Gastritis 10/07/2016  . Hx of bilateral breast implants 04/06/2017  . Hyperlipemia 05/26/2014   Last Assessment & Plan:  Formatting of this note might be different from the original. Lipid abnormalities are unchanged. Pt off cholesterol medicine Nutritional counseling was provided. and Pharmacotherapy as ordered. Lipids will be reassessed in 6 months. Formatting of this note might be different from the original. Last Assessment & Plan:  Lipid  abnormalities are unchanged. Pt off cholesterol m  . Hyperlipidemia   . Hypertension 05/26/2014   Last Assessment & Plan:  Formatting of this note might be different from the original. Hypertension is improving with treatment. Continue current treatment regimen. Dietary sodium restriction. Regular aerobic exercise. Blood pressure will be reassessed in 4 months. Formatting of this note might be different from the original. Last Assessment & Plan:  Hypertension is improving with treatment. Conti  . 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 Headaches are worsening. Further diagnostic evaluation per orders. Pt needs brain MRI  . Meningioma ([place]) 05/26/2014   Last Assessment & Plan:  Formatting of this note might be different from the original. Pt needs f/u brain MRI  . Otalgia of left ear 04/16/2016  . Other chest pain 06/10/2019  . Palpitations   . Preventative health care 08/14/2015  . PVC (premature ventricular contraction)   . PVC's (premature ventricular contractions) 06/10/2019  . Seasonal allergies 05/26/2014   Last Assessment & Plan:  Formatting of this note might be different from the original. Pt taking OTC allergy med Formatting of this note might be different from the original. Last Assessment & Plan:  Pt taking OTC allergy med  . Tinnitus of both ears 04/16/2016    Past Surgical History:  Procedure Laterality Date  . BRAIN SURGERY    . CHOLECYSTECTOMY      Current Medications:  Current Meds  Medication Sig  . acetaminophen (TYLENOL) 325 MG tablet Take 650 mg by mouth every 6 (six) hours as needed for moderate pain.  Marland Kitchen dicyclomine (BENTYL) 10 MG capsule Take 10 mg by mouth 2 (two) times daily.  . metoprolol succinate (TOPROL-XL) 50 MG 24 hr tablet Take 50 mg by mouth daily.  . pregabalin (LYRICA) 100 MG capsule Take 3 capsules every day as directed by physician.  .  triamterene-hydrochlorothiazide (MAXZIDE-25) 37.5-25 MG tablet Take 1 tablet by mouth daily as needed.   . vitamin B-12 (CYANOCOBALAMIN) 1000 MCG tablet Take 1,000 mcg by mouth daily.     Allergies:   Diltiazem hcl er, Hydrocodone-acetaminophen, Losartan potassium, Morphine, Morpholine salicylate, Prednisolone, Prednisone, Gabapentin, and Ofloxacin   Social History   Socioeconomic History  . Marital status: Widowed    Spouse name: Not on file  . Number of children: Not on file  . Years of education: Not on file  . Highest education level: Not on file  Occupational History  . Not on file  Tobacco Use  . Smoking status: Never Smoker  . Smokeless tobacco: Never Used  Vaping Use  . Vaping Use: Never used  Substance and Sexual Activity  . Alcohol use: Never  . Drug use: Never  . Sexual activity: Not on file  Other Topics Concern  . Not on file  Social History Narrative  . Not on file   Social Determinants of Health   Financial Resource Strain: Not on file  Food Insecurity: Not on file  Transportation Needs: Not on file  Physical Activity: Not on file  Stress: Not on file  Social Connections: Not on file     Family History: The patient's family history is not on file.  ROS:   Review of Systems  Constitution: Negative for decreased appetite, fever and weight gain.  HENT: Negative for congestion, ear discharge, hoarse voice and sore throat.   Eyes: Negative for discharge, redness, vision loss in right eye and visual halos.  Cardiovascular: Reports worsening palpitations.  Negative for chest pain, dyspnea on exertion, leg swelling, orthopnea.  Respiratory: Negative for cough, hemoptysis, shortness of breath and snoring.   Endocrine: Negative for heat intolerance and polyphagia.  Hematologic/Lymphatic: Negative for bleeding problem. Does not bruise/bleed easily.  Skin: Negative for flushing, nail changes, rash and suspicious lesions.  Musculoskeletal: Negative for  arthritis, joint pain, muscle cramps, myalgias, neck pain and stiffness.  Gastrointestinal: Negative for abdominal pain, bowel incontinence, diarrhea and excessive appetite.  Genitourinary: Negative for decreased libido, genital sores and incomplete emptying.  Neurological: Negative for brief paralysis, focal weakness, headaches and loss of balance.  Psychiatric/Behavioral: Negative for altered mental status, depression and suicidal ideas.  Allergic/Immunologic: Negative for HIV exposure and persistent infections.    EKGs/Labs/Other Studies Reviewed:    The following studies were reviewed today:   EKG: None today   Pharmacologic stress test  Nuclear stress EF: 78%.  There was no ST segment deviation noted during stress.  The study is normal.  This is a low risk study.  The left ventricular ejection fraction is hyperdynamic (>65%).  Normal stress nuclear study with no ischemia or infarction. Gated ejection fraction 78% with normal wall motion.   Echo IMPRESSIONS Feb 29, 2020 1. Left ventricular ejection fraction, by estimation, is 60 to 65%. The left ventricle has normal function. The left ventricle has no regional wall motion abnormalities. There is mild concentric left ventricular  hypertrophy. Left ventricular diastolic parameters are consistent with Grade  I diastolic dysfunction (impaired relaxation).  2. Right ventricular systolic function is normal. The right ventricular size is normal. There is moderately elevated pulmonary artery systolic pressure. The estimated right ventricular systolic pressure is moderately elevated 54.0 mmHg.  3. The mitral valve is normal in structure. No evidence of mitral valve regurgitation. No evidence of mitral stenosis.  4. The aortic valve is tricuspid. Aortic valve regurgitation is not visualized. No aortic stenosis is present.  5. The inferior vena cava is normal in size with greater than 50% respiratory variability, suggesting  right atrial pressure of 3 mmHg.   Zio monitor Mar 23, 2020 The patient wore the monitor for 12 days 21 hours starting February 14, 2020. Indication: Palpitations  The minimum heart rate was 55 bpm, maximum heart rate was 193 bpm, and average heart rate was 76 bpm. Predominant underlying rhythm was Sinus Rhythm.  45 Supraventricular Tachycardia runs occurred, the run with the fastest interval lasting 4 beats with a maximal rate of 193 bpm, the longest lasting 13.6 seconds with an average rate of 122 bpm.  Premature atrial complexes were occasional (4.3%,61832). Premature Ventricular complexes were rare (less than 1%).  No ventricular tachycardia, no pauses, No AV block and no atrial fibrillation present. 7 patient triggered events noted all associated premature atrial complexes. 7 diary events for associated with premature ventricular complexes and 3 associated with premature atrial complexes.  Conclusion: This study is remarkable for the following: 1. 45 episodes of paroxysmal supraventricular tachycardia which is likely atrial tachycardia with variable block. 2. Symptomatic occasional premature atrial complexes.   Recent Labs: 02/14/2020: BUN 11; Creatinine, Ser 0.97; Hemoglobin 10.1; Magnesium 2.1; Platelets 352; Potassium 4.0; Sodium 136  Recent Lipid Panel No results found for: CHOL, TRIG, HDL, CHOLHDL, VLDL, LDLCALC, LDLDIRECT  Physical Exam:    VS:  BP 138/76 (BP Location: Left Arm, Patient Position: Sitting)   Pulse 95   Ht 5' 7.5" (1.715 m)   Wt 190 lb (86.2 kg)   SpO2 98%   BMI 29.32 kg/m     Wt Readings from Last 3 Encounters:  10/16/20 190 lb (86.2 kg)  07/04/20 193 lb 1.3 oz (87.6 kg)  02/28/20 194 lb (88 kg)     GEN: Well nourished, well developed in no acute distress HEENT: Normal NECK: No JVD; No carotid bruits LYMPHATICS: No lymphadenopathy CARDIAC: S1S2 noted,RRR, no murmurs, rubs,  gallops RESPIRATORY:  Clear to auscultation without rales, wheezing or rhonchi  ABDOMEN: Soft, non-tender, non-distended, +bowel sounds, no guarding. EXTREMITIES: No edema, No cyanosis, no clubbing MUSCULOSKELETAL:  No deformity  SKIN: Warm and dry NEUROLOGIC:  Alert and oriented x 3, non-focal PSYCHIATRIC:  Normal affect, good insight  ASSESSMENT:    1. Palpitations   2. Primary hypertension   3. PVC's (premature ventricular contractions)   4. PAT (paroxysmal atrial tachycardia) ([place])   5. PAC (premature atrial contraction)    PLAN:     She has had worsening palpitations since she had a COVID-19 booster.  She said is very bothersome.  And notes that is also is irregular.  I like to put a 3-day ZIO monitor to patient to make sure that atrial fibrillation is not a factor here.  She has no major tachycardia as well as PVCs when she takes to monitor off we will increase her Toprol-XL from 50 mg daily to 50 mg in the morning and 25 mg in the evening.  Her blood pressure in the office today is acceptable but she tells me at  home is slightly elevated.  Of asked the patient take her blood pressure daily and bring this information at her next office visit which will be in 4 weeks.  The patient is in agreement with the above plan. The patient left the office in stable condition.  The patient will follow up in   Medication Adjustments/Labs and Tests Ordered: Current medicines are reviewed at length with the patient today.  Concerns regarding medicines are outlined above.  Orders Placed This Encounter  Procedures  . LONG TERM MONITOR (3-14 DAYS)   No orders of the defined types were placed in this encounter.   There are no Patient Instructions on file for this visit.   Adopting a Healthy Lifestyle.  Know what a healthy weight is for you (roughly BMI <25) and aim to maintain this   Aim for 7+ servings of fruits and vegetables daily   65-80+ fluid ounces of water or unsweet tea for  healthy kidneys   Limit to max 1 drink of alcohol per day; avoid smoking/tobacco   Limit animal fats in diet for cholesterol and heart health - choose grass fed whenever available   Avoid highly processed foods, and foods high in saturated/trans fats   Aim for low stress - take time to unwind and care for your mental health   Aim for 150 min of moderate intensity exercise weekly for heart health, and weights twice weekly for bone health   Aim for 7-9 hours of sleep daily   When it comes to diets, agreement about the perfect plan isnt easy to find, even among the experts. Experts at the [place] developed an idea known as the Healthy Eating Plate. Just imagine a plate divided into logical, healthy portions.   The emphasis is on diet quality:   Load up on vegetables and fruits - one-half of your plate: Aim for color and variety, and remember that potatoes dont count.   Go for whole grains - one-quarter of your plate: Whole wheat, barley, wheat berries, quinoa, oats, brown rice, and foods made with them. If you want pasta, go with whole wheat pasta.   Protein power - one-quarter of your plate: Fish, chicken, beans, and nuts are all healthy, versatile protein sources. Limit red meat.   The diet, however, does go beyond the plate, offering a few other suggestions.   Use healthy plant oils, such as olive, canola, soy, corn, sunflower and peanut. Check the labels, and avoid partially hydrogenated oil, which have unhealthy trans fats.   If youre thirsty, drink water. Coffee and tea are good in moderation, but skip sugary drinks and limit milk and dairy products to one or two daily servings.   The type of carbohydrate in the diet is more important than the amount. Some sources of carbohydrates, such as vegetables, fruits, whole grains, and beans-are healthier than others.   Finally, stay active  Signed, Berniece Salines, DO  10/16/2020 1:44 PM    [facility]

## 2020-10-16 NOTE — Telephone Encounter (Signed)
Scheduled patient for an appt for 1:00 pm on 10/16/20 with Dr. Harriet Masson.

## 2020-10-16 NOTE — Patient Instructions (Addendum)
Medication Instructions:  Your physician has recommended you make the following change in your medication:   START: Toprol XL 25 mg daily in evening in addition to the 50 mg in morning.   THIS CHANCE WILL GO EFFECT AFTER ZIO MONITOR   *If you need a refill on your cardiac medications before your next appointment, please call your pharmacy*   Lab Work: NONE If you have labs (blood work) drawn today and your tests are completely normal, you will receive your results only by: Marland Kitchen MyChart Message (if you have MyChart) OR . A paper copy in the mail If you have any lab test that is abnormal or we need to change your treatment, we will call you to review the results.   Testing/Procedures: NONE   Follow-Up: At [facility], you and your health needs are our priority.  As part of our continuing mission to provide you with exceptional heart care, we have created designated Provider Care Teams.  These Care Teams include your primary Cardiologist (physician) and Advanced Practice Providers (APPs -  Physician Assistants and Nurse Practitioners) who all work together to provide you with the care you need, when you need it.  We recommend signing up for the patient portal called "MyChart".  Sign up information is provided on this After Visit Summary.  MyChart is used to connect with patients for Virtual Visits (Telemedicine).  Patients are able to view lab/test results, encounter notes, upcoming appointments, etc.  Non-urgent messages can be sent to your provider as well.   To learn more about what you can do with MyChart, go to [email or web address].    Your next appointment:   4 week(s)  The format for your next appointment:   In Person  Provider:   Berniece Salines, DO   Other Instructions

## 2020-11-13 ENCOUNTER — Other Ambulatory Visit: Payer: Self-pay

## 2020-11-13 DIAGNOSIS — R002 Palpitations: Secondary | ICD-10-CM | POA: Insufficient documentation

## 2020-11-13 DIAGNOSIS — I493 Ventricular premature depolarization: Secondary | ICD-10-CM | POA: Insufficient documentation

## 2020-11-13 DIAGNOSIS — C801 Malignant (primary) neoplasm, unspecified: Secondary | ICD-10-CM | POA: Insufficient documentation

## 2020-11-13 DIAGNOSIS — E785 Hyperlipidemia, unspecified: Secondary | ICD-10-CM | POA: Insufficient documentation

## 2020-11-15 ENCOUNTER — Ambulatory Visit: Payer: Medicare Other | Admitting: Cardiology

## 2020-12-10 ENCOUNTER — Encounter: Payer: Self-pay | Admitting: Cardiology

## 2020-12-10 ENCOUNTER — Ambulatory Visit: Payer: Medicare Other | Admitting: Cardiology

## 2020-12-10 ENCOUNTER — Other Ambulatory Visit: Payer: Self-pay

## 2020-12-10 VITALS — BP 144/80 | HR 74 | Ht 67.0 in | Wt 192.0 lb

## 2020-12-10 DIAGNOSIS — K625 Hemorrhage of anus and rectum: Secondary | ICD-10-CM | POA: Insufficient documentation

## 2020-12-10 DIAGNOSIS — Z800 Family history of malignant neoplasm of digestive organs: Secondary | ICD-10-CM | POA: Insufficient documentation

## 2020-12-10 DIAGNOSIS — I10 Essential (primary) hypertension: Secondary | ICD-10-CM | POA: Diagnosis not present

## 2020-12-10 DIAGNOSIS — I491 Atrial premature depolarization: Secondary | ICD-10-CM

## 2020-12-10 DIAGNOSIS — K5641 Fecal impaction: Secondary | ICD-10-CM | POA: Insufficient documentation

## 2020-12-10 DIAGNOSIS — E782 Mixed hyperlipidemia: Secondary | ICD-10-CM

## 2020-12-10 DIAGNOSIS — Z79899 Other long term (current) drug therapy: Secondary | ICD-10-CM

## 2020-12-10 DIAGNOSIS — R5383 Other fatigue: Secondary | ICD-10-CM

## 2020-12-10 DIAGNOSIS — I493 Ventricular premature depolarization: Secondary | ICD-10-CM

## 2020-12-10 NOTE — Progress Notes (Signed)
Cardiology Office Note:    Date:  12/10/2020   ID:  Jennifer Melendez, DOB 10/18/1941, MRN [id number]  PCP:  Robyne Peers, MD  Cardiologist:  Berniece Salines, DO  Electrophysiologist:  None   Referring MD: Robyne Peers, MD     History of Present Illness:    Jennifer Melendez is a 80 y.o. female with a hx of hypertension, hyperlipidemia, PVCs, PACs impresses mitral tachycardia.  She is here today for follow-up visit.  I saw the patient in December 2021 at that time I increased her Toprol-XL from 50 mg daily to 50 mg in the morning and 25 mg in the evening.  I did place a monitor on the patient again to reassess her burden for PACs which showed occasional PACs.  She is here today for follow-up visit.  She tells me the last saw her she really had not increase her metoprolol and is still experiencing some palpitations.  Past Medical History:  Diagnosis Date  . Acute sphenoidal sinusitis 06/28/2014   Last Assessment & Plan:  Formatting of this note might be different from the original. Pt will continue nasal steroid. Z pack sent to pharmacy.  Marland Kitchen Axillary fullness 04/06/2017  . Barrett's esophagus with esophagitis 07/04/2015  . Cancer ([place])   . Chronic fatigue 06/28/2014   Last Assessment & Plan:  Formatting of this note might be different from the original. Psychological condition is worsening. May need to do sleep study to r/o OSA check labs today Psychological condition  will be reassessed in 3 months.  . Constipation   . Fibromyalgia   . Gastritis 10/07/2016  . Hx of bilateral breast implants 04/06/2017  . Hyperlipemia 05/26/2014   Last Assessment & Plan:  Formatting of this note might be different from the original. Lipid abnormalities are unchanged. Pt off cholesterol medicine Nutritional counseling was provided. and Pharmacotherapy as ordered. Lipids will be reassessed in 6 months. Formatting of this note might be different from the original. Last Assessment & Plan:  Lipid  abnormalities are unchanged. Pt off cholesterol m  . Hyperlipidemia   . Hypertension 05/26/2014   Last Assessment & Plan:  Formatting of this note might be different from the original. Hypertension is improving with treatment. Continue current treatment regimen. Dietary sodium restriction. Regular aerobic exercise. Blood pressure will be reassessed in 4 months. Formatting of this note might be different from the original. Last Assessment & Plan:  Hypertension is improving with treatment. Conti  . 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 Headaches are worsening. Further diagnostic evaluation per orders. Pt needs brain MRI  . Meningioma ([place]) 05/26/2014   Last Assessment & Plan:  Formatting of this note might be different from the original. Pt needs f/u brain MRI  . Otalgia of left ear 04/16/2016  . Other chest pain 06/10/2019  . PAC (premature atrial contraction) 07/04/2020  . Palpitations   . PAT (paroxysmal atrial tachycardia) ([place]) 07/04/2020  . Preventative health care 08/14/2015  . PVC (premature ventricular contraction)   . PVC's (premature ventricular contractions) 06/10/2019  . Seasonal allergies 05/26/2014   Last Assessment & Plan:  Formatting of this note might be different from the original. Pt taking OTC allergy med Formatting of this note might be different from the original. Last Assessment & Plan:  Pt taking OTC allergy med  . Tinnitus of both ears 04/16/2016    Past Surgical History:  Procedure  Laterality Date  . BRAIN SURGERY    . CHOLECYSTECTOMY      Current Medications: Current Meds  Medication Sig  . acetaminophen (TYLENOL) 325 MG tablet Take 650 mg by mouth every 6 (six) hours as needed for moderate pain.  Marland Kitchen dicyclomine (BENTYL) 10 MG capsule Take 10 mg by mouth 2 (two) times daily.  Marland Kitchen esomeprazole (NEXIUM) 20 MG capsule Take 40 mg by mouth daily.  . metoprolol succinate (TOPROL XL)  25 MG 24 hr tablet Take 1 tablet (25 mg total) by mouth daily.  . metoprolol succinate (TOPROL-XL) 50 MG 24 hr tablet Take 50 mg by mouth daily.  Marland Kitchen perphenazine-amitriptyline (ETRAFON/TRIAVIL) 2-10 MG TABS tablet Take 1 tablet by mouth daily.  . pregabalin (LYRICA) 100 MG capsule Take 3 capsules every day as directed by physician.  . triamterene-hydrochlorothiazide (MAXZIDE-25) 37.5-25 MG tablet Take 1 tablet by mouth daily as needed.   . vitamin B-12 (CYANOCOBALAMIN) 1000 MCG tablet Take 1,000 mcg by mouth daily.     Allergies:   Ciprofloxacin, Diltiazem hcl er, Hydrocodone-acetaminophen, Losartan potassium, Morphine, Morpholine salicylate, Prednisolone, Prednisone, Gabapentin, and Ofloxacin   Social History   Socioeconomic History  . Marital status: Widowed    Spouse name: Not on file  . Number of children: Not on file  . Years of education: Not on file  . Highest education level: Not on file  Occupational History  . Not on file  Tobacco Use  . Smoking status: Never Smoker  . Smokeless tobacco: Never Used  Vaping Use  . Vaping Use: Never used  Substance and Sexual Activity  . Alcohol use: Never  . Drug use: Never  . Sexual activity: Not on file  Other Topics Concern  . Not on file  Social History Narrative  . Not on file   Social Determinants of Health   Financial Resource Strain: Not on file  Food Insecurity: Not on file  Transportation Needs: Not on file  Physical Activity: Not on file  Stress: Not on file  Social Connections: Not on file     Family History: The patient's family history is not on file.  ROS:   Review of Systems  Constitution: Negative for decreased appetite, fever and weight gain.  HENT: Negative for congestion, ear discharge, hoarse voice and sore throat.   Eyes: Negative for discharge, redness, vision loss in right eye and visual halos.  Cardiovascular: Negative for chest pain, dyspnea on exertion, leg swelling, orthopnea and palpitations.   Respiratory: Negative for cough, hemoptysis, shortness of breath and snoring.   Endocrine: Negative for heat intolerance and polyphagia.  Hematologic/Lymphatic: Negative for bleeding problem. Does not bruise/bleed easily.  Skin: Negative for flushing, nail changes, rash and suspicious lesions.  Musculoskeletal: Negative for arthritis, joint pain, muscle cramps, myalgias, neck pain and stiffness.  Gastrointestinal: Negative for abdominal pain, bowel incontinence, diarrhea and excessive appetite.  Genitourinary: Negative for decreased libido, genital sores and incomplete emptying.  Neurological: Negative for brief paralysis, focal weakness, headaches and loss of balance.  Psychiatric/Behavioral: Negative for altered mental status, depression and suicidal ideas.  Allergic/Immunologic: Negative for HIV exposure and persistent infections.    EKGs/Labs/Other Studies Reviewed:    The following studies were reviewed today:   EKG:  The ekg ordered today demonstrates    Pharmacologic stress test  Nuclear stress EF: 78%.  There was no ST segment deviation noted during stress.  The study is normal.  This is a low risk study.  The left ventricular ejection fraction is  hyperdynamic (>65%).  Normal stress nuclear study with no ischemia or infarction. Gated ejection fraction 78% with normal wall motion.   EchoIMPRESSIONSMay 5, 2021 1. Left ventricular ejection fraction, by estimation, is 60 to 65%. The left ventricle has normal function. The left ventricle has no regional wall motion abnormalities. There is mild concentric left ventricular  hypertrophy. Left ventricular diastolic parameters are consistent with Grade I diastolic dysfunction (impaired relaxation).  2. Right ventricular systolic function is normal. The right ventricular size is normal. There is moderately elevated pulmonary artery systolic pressure. The estimated right ventricular systolic pressure is moderately  elevated 54.0 mmHg.  3. The mitral valve is normal in structure. No evidence of mitral valve regurgitation. No evidence of mitral stenosis.  4. The aortic valve is tricuspid. Aortic valve regurgitation is not visualized. No aortic stenosis is present.  5. The inferior vena cava is normal in size with greater than 50% respiratory variability, suggesting right atrial pressure of 3 mmHg.  Zio monitorMay 28, 2021 The patient wore the monitor for 12 days 21 hours starting February 14, 2020. Indication: Palpitations  The minimum heart rate was 55 bpm, maximum heart rate was 193 bpm, and average heart rate was 76 bpm. Predominant underlying rhythm was Sinus Rhythm.  45 Supraventricular Tachycardia runs occurred, the run with the fastest interval lasting 4 beats with a maximal rate of 193 bpm, the longest lasting 13.6 seconds with an average rate of 122 bpm.  Premature atrial complexes were occasional (4.3%,61832). Premature Ventricular complexes were rare (less than 1%).  No ventricular tachycardia, no pauses, No AV block and no atrial fibrillation present. 7 patient triggered events noted all associated premature atrial complexes. 7 diary events for associated with premature ventricular complexes and 3 associated with premature atrial complexes.  Conclusion: This study is remarkable for the following: 1. 45 episodes of paroxysmal supraventricular tachycardia which is likely atrial tachycardia with variable block. 2. Symptomatic occasional premature atrial complexes.    Recent Labs: 02/14/2020: BUN 11; Creatinine, Ser 0.97; Hemoglobin 10.1; Magnesium 2.1; Platelets 352; Potassium 4.0; Sodium 136  Recent Lipid Panel No results found for: CHOL, TRIG, HDL, CHOLHDL, VLDL, LDLCALC, LDLDIRECT  Physical Exam:    VS:  BP (!) 144/80   Pulse 74   Ht 5\' 7"  (1.702 m)   Wt 192 lb (87.1 kg)   SpO2 93%   BMI 30.07 kg/m     Wt  Readings from Last 3 Encounters:  12/10/20 192 lb (87.1 kg)  10/16/20 190 lb (86.2 kg)  07/04/20 193 lb 1.3 oz (87.6 kg)     GEN: Well nourished, well developed in no acute distress HEENT: Normal NECK: No JVD; No carotid bruits LYMPHATICS: No lymphadenopathy CARDIAC: S1S2 noted,RRR, no murmurs, rubs, gallops RESPIRATORY:  Clear to auscultation without rales, wheezing or rhonchi  ABDOMEN: Soft, non-tender, non-distended, +bowel sounds, no guarding. EXTREMITIES: No edema, No cyanosis, no clubbing MUSCULOSKELETAL:  No deformity  SKIN: Warm and dry NEUROLOGIC:  Alert and oriented x 3, non-focal PSYCHIATRIC:  Normal affect, good insight  ASSESSMENT:    1. PAC (premature atrial contraction)   2. Other fatigue   3. Medication management   4. Primary hypertension   5. PVC (premature ventricular contraction)   6. Mixed hyperlipidemia    PLAN:     We talked about his ZIO monitor which show occasional PACs.  She had not increase her metoprolol since her last visit.  Her blood pressure is also slightly elevated.  She is willing to go up  on her metoprolol which would make a total of 75 mg daily.  In addition I will get blood work today as the patient tells me she has had some fatigue and is concerned because she has had anemia in the past and she had not been able to see her primary care doctor recently.  The patient is in agreement with the above plan. The patient left the office in stable condition.  The patient will follow up in   Medication Adjustments/Labs and Tests Ordered: Current medicines are reviewed at length with the patient today.  Concerns regarding medicines are outlined above.  Orders Placed This Encounter  Procedures  . Basic metabolic panel  . CBC with Differential/Platelet  . Magnesium  . TSH   No orders of the defined types were placed in this encounter.   Patient Instructions  Medication Instructions:  Your physician recommends that you continue on your  current medications as directed. Please refer to the Current Medication list given to you today.  *If you need a refill on your cardiac medications before your next appointment, please call your pharmacy*   Lab Work: Your physician recommends that you return for lab work in:  BMET, MAG, CBC, TSH  If you have labs (blood work) drawn today and your tests are completely normal, you will receive your results only by: Marland Kitchen MyChart Message (if you have MyChart) OR . A paper copy in the mail If you have any lab test that is abnormal or we need to change your treatment, we will call you to review the results.   Testing/Procedures: NONE   Follow-Up: At [facility], you and your health needs are our priority.  As part of our continuing mission to provide you with exceptional heart care, we have created designated Provider Care Teams.  These Care Teams include your primary Cardiologist (physician) and Advanced Practice Providers (APPs -  Physician Assistants and Nurse Practitioners) who all work together to provide you with the care you need, when you need it.  We recommend signing up for the patient portal called "MyChart".  Sign up information is provided on this After Visit Summary.  MyChart is used to connect with patients for Virtual Visits (Telemedicine).  Patients are able to view lab/test results, encounter notes, upcoming appointments, etc.  Non-urgent messages can be sent to your provider as well.   To learn more about what you can do with MyChart, go to [email or web address].    Your next appointment:   6 month(s)  The format for your next appointment:   In Person  Provider:   Berniece Salines, DO   Other Instructions      Adopting a Healthy Lifestyle.  Know what a healthy weight is for you (roughly BMI <25) and aim to maintain this   Aim for 7+ servings of fruits and vegetables daily   65-80+ fluid ounces of water or unsweet tea for healthy kidneys   Limit to max 1 drink  of alcohol per day; avoid smoking/tobacco   Limit animal fats in diet for cholesterol and heart health - choose grass fed whenever available   Avoid highly processed foods, and foods high in saturated/trans fats   Aim for low stress - take time to unwind and care for your mental health   Aim for 150 min of moderate intensity exercise weekly for heart health, and weights twice weekly for bone health   Aim for 7-9 hours of sleep daily   When it comes to diets, agreement  about the perfect plan isnt easy to find, even among the experts. Experts at the [place] developed an idea known as the Healthy Eating Plate. Just imagine a plate divided into logical, healthy portions.   The emphasis is on diet quality:   Load up on vegetables and fruits - one-half of your plate: Aim for color and variety, and remember that potatoes dont count.   Go for whole grains - one-quarter of your plate: Whole wheat, barley, wheat berries, quinoa, oats, brown rice, and foods made with them. If you want pasta, go with whole wheat pasta.   Protein power - one-quarter of your plate: Fish, chicken, beans, and nuts are all healthy, versatile protein sources. Limit red meat.   The diet, however, does go beyond the plate, offering a few other suggestions.   Use healthy plant oils, such as olive, canola, soy, corn, sunflower and peanut. Check the labels, and avoid partially hydrogenated oil, which have unhealthy trans fats.   If youre thirsty, drink water. Coffee and tea are good in moderation, but skip sugary drinks and limit milk and dairy products to one or two daily servings.   The type of carbohydrate in the diet is more important than the amount. Some sources of carbohydrates, such as vegetables, fruits, whole grains, and beans-are healthier than others.   Finally, stay active  Signed, Berniece Salines, DO  12/10/2020 3:37 PM    [facility]

## 2020-12-10 NOTE — Patient Instructions (Signed)
Medication Instructions:  Your physician recommends that you continue on your current medications as directed. Please refer to the Current Medication list given to you today.  *If you need a refill on your cardiac medications before your next appointment, please call your pharmacy*   Lab Work: Your physician recommends that you return for lab work in:  BMET, MAG, CBC, TSH  If you have labs (blood work) drawn today and your tests are completely normal, you will receive your results only by: Marland Kitchen MyChart Message (if you have MyChart) OR . A paper copy in the mail If you have any lab test that is abnormal or we need to change your treatment, we will call you to review the results.   Testing/Procedures: NONE   Follow-Up: At [facility], you and your health needs are our priority.  As part of our continuing mission to provide you with exceptional heart care, we have created designated Provider Care Teams.  These Care Teams include your primary Cardiologist (physician) and Advanced Practice Providers (APPs -  Physician Assistants and Nurse Practitioners) who all work together to provide you with the care you need, when you need it.  We recommend signing up for the patient portal called "MyChart".  Sign up information is provided on this After Visit Summary.  MyChart is used to connect with patients for Virtual Visits (Telemedicine).  Patients are able to view lab/test results, encounter notes, upcoming appointments, etc.  Non-urgent messages can be sent to your provider as well.   To learn more about what you can do with MyChart, go to [email or web address].    Your next appointment:   6 month(s)  The format for your next appointment:   In Person  Provider:   Berniece Salines, DO   Other Instructions

## 2020-12-11 LAB — CBC WITH DIFFERENTIAL/PLATELET
Basophils Absolute: 0.1 10*3/uL (ref 0.0–0.2)
Basos: 1 %
EOS (ABSOLUTE): 0.2 10*3/uL (ref 0.0–0.4)
Eos: 2 %
Hematocrit: 40.1 % (ref 34.0–46.6)
Hemoglobin: 13.5 g/dL (ref 11.1–15.9)
Immature Grans (Abs): 0 10*3/uL (ref 0.0–0.1)
Immature Granulocytes: 0 %
Lymphocytes Absolute: 2.4 10*3/uL (ref 0.7–3.1)
Lymphs: 23 %
MCH: 28.8 pg (ref 26.6–33.0)
MCHC: 33.7 g/dL (ref 31.5–35.7)
MCV: 86 fL (ref 79–97)
Monocytes Absolute: 0.9 10*3/uL (ref 0.1–0.9)
Monocytes: 9 %
Neutrophils Absolute: 6.7 10*3/uL (ref 1.4–7.0)
Neutrophils: 65 %
Platelets: 283 10*3/uL (ref 150–450)
RBC: 4.69 x10E6/uL (ref 3.77–5.28)
RDW: 13.7 % (ref 11.7–15.4)
WBC: 10.2 10*3/uL (ref 3.4–10.8)

## 2020-12-11 LAB — BASIC METABOLIC PANEL
BUN/Creatinine Ratio: 10 — ABNORMAL LOW (ref 12–28)
BUN: 8 mg/dL (ref 8–27)
CO2: 22 mmol/L (ref 20–29)
Calcium: 9.5 mg/dL (ref 8.7–10.3)
Chloride: 104 mmol/L (ref 96–106)
Creatinine, Ser: 0.82 mg/dL (ref 0.57–1.00)
GFR calc Af Amer: 79 mL/min/{1.73_m2} (ref >59)
GFR calc non Af Amer: 68 mL/min/{1.73_m2} (ref >59)
Glucose: 113 mg/dL — ABNORMAL HIGH (ref 65–99)
Potassium: 4.7 mmol/L (ref 3.5–5.2)
Sodium: 141 mmol/L (ref 134–144)

## 2020-12-11 LAB — TSH: TSH: 3.34 u[IU]/mL (ref 0.450–4.500)

## 2020-12-11 LAB — MAGNESIUM: Magnesium: 2.3 mg/dL (ref 1.6–2.3)

## 2021-06-11 ENCOUNTER — Encounter: Payer: Self-pay | Admitting: Cardiology

## 2021-06-11 ENCOUNTER — Other Ambulatory Visit: Payer: Self-pay

## 2021-06-11 ENCOUNTER — Ambulatory Visit: Payer: Medicare Other | Admitting: Cardiology

## 2021-06-11 VITALS — BP 142/80 | HR 74 | Ht 67.0 in | Wt 191.0 lb

## 2021-06-11 DIAGNOSIS — I491 Atrial premature depolarization: Secondary | ICD-10-CM | POA: Diagnosis not present

## 2021-06-11 DIAGNOSIS — I493 Ventricular premature depolarization: Secondary | ICD-10-CM | POA: Diagnosis not present

## 2021-06-11 DIAGNOSIS — I471 Supraventricular tachycardia: Secondary | ICD-10-CM | POA: Diagnosis not present

## 2021-06-11 DIAGNOSIS — E782 Mixed hyperlipidemia: Secondary | ICD-10-CM

## 2021-06-11 DIAGNOSIS — I10 Essential (primary) hypertension: Secondary | ICD-10-CM | POA: Diagnosis not present

## 2021-06-11 DIAGNOSIS — I4719 Other supraventricular tachycardia: Secondary | ICD-10-CM

## 2021-06-11 NOTE — Patient Instructions (Addendum)
Medication Instructions:  Your physician recommends that you continue on your current medications as directed. Please refer to the Current Medication list given to you today.  *If you need a refill on your cardiac medications before your next appointment, please call your pharmacy*   Lab Work: Your physician recommends that you return for lab work in:  TODAY: BMET, [place] If you have labs (blood work) drawn today and your tests are completely normal, you will receive your results only by: MyChart Message (if you have [place]) OR A paper copy in the mail If you have any lab test that is abnormal or we need to change your treatment, we will call you to review the results.   Testing/Procedures: None   Follow-Up: At [facility], you and your health needs are our priority.  As part of our continuing mission to provide you with exceptional heart care, we have created designated Provider Care Teams.  These Care Teams include your primary Cardiologist (physician) and Advanced Practice Providers (APPs -  Physician Assistants and Nurse Practitioners) who all work together to provide you with the care you need, when you need it.  We recommend signing up for the patient portal called "MyChart".  Sign up information is provided on this After Visit Summary.  MyChart is used to connect with patients for Virtual Visits (Telemedicine).  Patients are able to view lab/test results, encounter notes, upcoming appointments, etc.  Non-urgent messages can be sent to your provider as well.   To learn more about what you can do with MyChart, go to [email or web address].    Your next appointment:   1 year(s)  The format for your next appointment:   In Person  Provider:   [place], DO [address] [place], [place] [address] [facility]: [phone]    Other Instructions

## 2021-06-11 NOTE — Progress Notes (Signed)
Cardiology Office Note:    Date:  06/11/2021   ID:  ELEFTERIA MUNGOVAN, DOB 1941-05-14, MRN [id number]  PCP:  Robyne Peers, MD  Cardiologist:  Berniece Salines, DO  Electrophysiologist:  None   Referring MD: Robyne Peers, MD   Chief Complaint  Patient presents with   Follow-up    History of Present Illness:    Jennifer Melendez is a 80 y.o. female with a hx of hypertension, hyperlipidemia, PVCs, PACs and paroxysmal atrial tachycardia is here today for follow-up visit.  I saw the patient in December 2021 at that time I increased her Toprol-XL from 50 mg daily to 50 mg in the morning and 25 mg in the evening.  I did place a monitor on the patient again to reassess her burden for PACs which showed occasional PACs.  She was seen on 01/07/2021 at that time she was still having some palpitations so I advised to increase her metoprolol to succinate to 75 mg daily.  She did tell me that she did not actually increase this medication given the fact that she felt much better several days after her appointment.  Since her visit she has had an incident where she got scratched by her dog and she was treated for cellulitis no other complaints.  Past Medical History:  Diagnosis Date   Acute sphenoidal sinusitis 06/28/2014   Last Assessment & Plan:  Formatting of this note might be different from the original. Pt will continue nasal steroid. Z pack sent to pharmacy.   Axillary fullness 04/06/2017   Barrett's esophagus with esophagitis 07/04/2015   Cancer (HCC)    Chronic fatigue 06/28/2014   Last Assessment & Plan:  Formatting of this note might be different from the original. Psychological condition is worsening. May need to do sleep study to r/o OSA check labs today Psychological condition  will be reassessed in 3 months.   Constipation    Fibromyalgia    Gastritis 10/07/2016   Hx of bilateral breast implants 04/06/2017   Hyperlipemia 05/26/2014   Last Assessment & Plan:  Formatting of this  note might be different from the original. Lipid abnormalities are unchanged. Pt off cholesterol medicine Nutritional counseling was provided. and Pharmacotherapy as ordered. Lipids will be reassessed in 6 months. Formatting of this note might be different from the original. Last Assessment & Plan:  Lipid abnormalities are unchanged. Pt off cholesterol m   Hyperlipidemia    Hypertension 05/26/2014   Last Assessment & Plan:  Formatting of this note might be different from the original. Hypertension is improving with treatment. Continue current treatment regimen. Dietary sodium restriction. Regular aerobic exercise. Blood pressure will be reassessed in 4 months. Formatting of this note might be different from the original. Last Assessment & Plan:  Hypertension is improving with treatment. 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 Headaches are worsening. Further diagnostic evaluation per orders. Pt needs brain MRI   Meningioma ([place]) 05/26/2014   Last Assessment & Plan:  Formatting of this note might be different from the original. Pt needs f/u brain MRI   Otalgia of left ear 04/16/2016   Other chest pain 06/10/2019   PAC (premature atrial contraction) 07/04/2020   Palpitations    PAT (paroxysmal atrial tachycardia) ([place]) 07/04/2020   Preventative health care 08/14/2015   PVC (premature ventricular contraction)    PVC's (premature ventricular contractions) 06/10/2019   Seasonal  allergies 05/26/2014   Last Assessment & Plan:  Formatting of this note might be different from the original. Pt taking OTC allergy med Formatting of this note might be different from the original. 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   amitriptyline (ELAVIL) 10 MG tablet Take 10 mg by mouth at bedtime.   dicyclomine (BENTYL) 10 MG capsule Take 10 mg by mouth 2 (two) times daily.   esomeprazole (NEXIUM) 20 MG capsule Take 40 mg by mouth daily.   metoprolol succinate (TOPROL-XL) 50 MG 24 hr tablet Take 50 mg by mouth daily.   pregabalin (LYRICA) 100 MG capsule Take 3 capsules every day as directed by physician.   triamterene-hydrochlorothiazide (MAXZIDE-25) 37.5-25 MG tablet Take 1 tablet by mouth daily as needed.    vitamin B-12 (CYANOCOBALAMIN) 1000 MCG tablet Take 1,000 mcg by mouth daily.     Allergies:   Ciprofloxacin, Diltiazem hcl er, Hydrocodone-acetaminophen, Losartan potassium, Morphine, Morpholine salicylate, Prednisolone, Prednisone, Gabapentin, and Ofloxacin   Social History   Socioeconomic History   Marital status: Widowed    Spouse name: Not on file   Number of children: Not on file   Years of education: Not on file   Highest education level: Not on file  Occupational History   Not on file  Tobacco Use   Smoking status: Never   Smokeless tobacco: Never  Vaping Use   Vaping Use: Never used  Substance and Sexual Activity   Alcohol use: Never   Drug use: Never   Sexual activity: Not on file  Other Topics Concern   Not on file  Social History Narrative   Not on file   Social Determinants of Health   Financial Resource Strain: Not on file  Food Insecurity: Not on file  Transportation Needs: Not on file  Physical Activity: Not on file  Stress: Not on file  Social Connections: Not on file     Family History: The patient's family history includes Cancer in her father; Diabetes in her maternal grandmother and sister; Heart attack in her sister; Hypertension in her mother; Leukemia in her brother; Lung cancer in her mother.  ROS:   Review of Systems  Constitution: Negative for decreased  appetite, fever and weight gain.  HENT: Negative for congestion, ear discharge, hoarse voice and sore throat.   Eyes: Negative for discharge, redness, vision loss in right eye and visual halos.  Cardiovascular: Negative for chest pain, dyspnea on exertion, leg swelling, orthopnea and palpitations.  Respiratory: Negative for cough, hemoptysis, shortness of breath and snoring.   Endocrine: Negative for heat intolerance and polyphagia.  Hematologic/Lymphatic: Negative for bleeding problem. Does not bruise/bleed easily.  Skin: Negative for flushing, nail changes, rash and suspicious lesions.  Musculoskeletal: Negative for arthritis, joint pain, muscle cramps, myalgias, neck pain and stiffness.  Gastrointestinal: Negative for abdominal pain, bowel incontinence, diarrhea and excessive appetite.  Genitourinary: Negative for decreased libido, genital sores and incomplete emptying.  Neurological: Negative for brief paralysis, focal weakness, headaches and loss of balance.  Psychiatric/Behavioral: Negative for altered mental status, depression and suicidal ideas.  Allergic/Immunologic: Negative for HIV exposure and persistent infections.  EKGs/Labs/Other Studies Reviewed:    The following studies were reviewed today:   EKG:  The ekg ordered today demonstrates sinus rhythm, heart rate 74 bpm with rare PACs  Pharmacologic stress test Nuclear stress EF: 78%. There was no ST segment deviation noted during stress. The study is normal. This is a low risk study. The left ventricular ejection fraction is hyperdynamic (>65%).   Normal stress nuclear study with no ischemia or infarction.  Gated ejection fraction 78% with normal wall motion.     Echo IMPRESSIONS Feb 29, 2020  1. Left ventricular ejection fraction, by estimation, is 60 to 65%. The left ventricle has normal function. The left ventricle has no regional wall motion abnormalities. There is mild concentric left ventricular  hypertrophy.  Left ventricular diastolic parameters are consistent with Grade I diastolic dysfunction (impaired relaxation).   2. Right ventricular systolic function is normal. The right ventricular size is normal. There is moderately elevated pulmonary artery systolic pressure. The estimated right ventricular systolic pressure is moderately elevated 54.0 mmHg.   3. The mitral valve is normal in structure. No evidence of mitral valve regurgitation. No evidence of mitral stenosis.   4. The aortic valve is tricuspid. Aortic valve regurgitation is not visualized. No aortic stenosis is present.   5. The inferior vena cava is normal in size with greater than 50% respiratory variability, suggesting right atrial pressure of 3 mmHg.    Zio monitor Mar 23, 2020 The patient wore the monitor for 12 days 21 hours starting February 14, 2020. Indication: Palpitations   The minimum heart rate was 55 bpm, maximum heart rate was 193 bpm, and average heart rate was 76 bpm. Predominant underlying rhythm was Sinus Rhythm.   45 Supraventricular Tachycardia runs occurred, the run with the fastest interval lasting 4 beats with a maximal rate of 193 bpm, the longest lasting 13.6 seconds with an average rate of 122 bpm.   Premature atrial complexes were occasional (4.3%,61832). Premature Ventricular complexes were rare (less than 1%).   No ventricular tachycardia, no pauses, No AV block and no atrial fibrillation present. 7 patient triggered events noted all associated premature atrial complexes.  7 diary events for associated with premature ventricular complexes and 3 associated with premature atrial complexes.   Conclusion: This study is remarkable for the following:                             1.  45 episodes of paroxysmal supraventricular tachycardia which is likely atrial tachycardia with variable block.                             2.  Symptomatic occasional premature atrial complexes.  Recent Labs: 12/10/2020: BUN 8;  Creatinine, Ser 0.82; Hemoglobin 13.5; Magnesium 2.3; Platelets 283; Potassium 4.7; Sodium 141; TSH 3.340  Recent Lipid Panel No results found for: CHOL, TRIG, HDL, CHOLHDL, VLDL, LDLCALC, LDLDIRECT  Physical Exam:    VS:  BP (!) 142/80 (BP Location: Left Arm, Patient Position: Sitting, Cuff Size: Normal)   Pulse 74   Ht '5\' 7"'$  (1.702 m)   Wt 191 lb (86.6 kg)   SpO2 97%   BMI 29.91 kg/m     Wt Readings from Last 3 Encounters:  06/11/21 191 lb (86.6 kg)  12/10/20 192 lb (87.1 kg)  10/16/20 190 lb (86.2 kg)     GEN: Well nourished, well developed in no acute  distress HEENT: Normal NECK: No JVD; No carotid bruits LYMPHATICS: No lymphadenopathy CARDIAC: S1S2 noted,RRR, no murmurs, rubs, gallops RESPIRATORY:  Clear to auscultation without rales, wheezing or rhonchi  ABDOMEN: Soft, non-tender, non-distended, +bowel sounds, no guarding. EXTREMITIES: No edema, No cyanosis, no clubbing MUSCULOSKELETAL:  No deformity  SKIN: Warm and dry NEUROLOGIC:  Alert and oriented x 3, non-focal PSYCHIATRIC:  Normal affect, good insight  ASSESSMENT:    1. Hypertension, unspecified type   2. PAC (premature atrial contraction)   3. PAT (paroxysmal atrial tachycardia) ([place])   4. PVC (premature ventricular contraction)   5. Mixed hyperlipidemia    PLAN:     She appears to be doing well from a cardiovascular standpoint.  No medication changes will be made today.  She had not taking the increased dose of metoprolol succinate.  So therefore she is back on 50 mg daily.  Blood pressure is acceptable, continue with current antihypertensive regimen.   The patient is in agreement with the above plan. The patient left the office in stable condition.  The patient will follow up in 1 year or sooner if needed.   Medication Adjustments/Labs and Tests Ordered: Current medicines are reviewed at length with the patient today.  Concerns regarding medicines are outlined above.  Orders Placed This Encounter   Procedures   Basic metabolic panel   Magnesium   EKG 12-Lead   No orders of the defined types were placed in this encounter.   Patient Instructions  Medication Instructions:  Your physician recommends that you continue on your current medications as directed. Please refer to the Current Medication list given to you today.  *If you need a refill on your cardiac medications before your next appointment, please call your pharmacy*   Lab Work: Your physician recommends that you return for lab work in:  TODAY: BMET, [place] If you have labs (blood work) drawn today and your tests are completely normal, you will receive your results only by: MyChart Message (if you have [place]) OR A paper copy in the mail If you have any lab test that is abnormal or we need to change your treatment, we will call you to review the results.   Testing/Procedures: None   Follow-Up: At [facility], you and your health needs are our priority.  As part of our continuing mission to provide you with exceptional heart care, we have created designated Provider Care Teams.  These Care Teams include your primary Cardiologist (physician) and Advanced Practice Providers (APPs -  Physician Assistants and Nurse Practitioners) who all work together to provide you with the care you need, when you need it.  We recommend signing up for the patient portal called "MyChart".  Sign up information is provided on this After Visit Summary.  MyChart is used to connect with patients for Virtual Visits (Telemedicine).  Patients are able to view lab/test results, encounter notes, upcoming appointments, etc.  Non-urgent messages can be sent to your provider as well.   To learn more about what you can do with MyChart, go to [email or web address].    Your next appointment:   1 year(s)  The format for your next appointment:   In Person  Provider:   [place], DO [address] [place], [place]  [address] [facility]: [phone]    Other Instructions    Adopting a Healthy Lifestyle.  Know what a healthy weight is for you (roughly BMI <25) and aim to maintain this   Aim for 7+  servings of fruits and vegetables daily   65-80+ fluid ounces of water or unsweet tea for healthy kidneys   Limit to max 1 drink of alcohol per day; avoid smoking/tobacco   Limit animal fats in diet for cholesterol and heart health - choose grass fed whenever available   Avoid highly processed foods, and foods high in saturated/trans fats   Aim for low stress - take time to unwind and care for your mental health   Aim for 150 min of moderate intensity exercise weekly for heart health, and weights twice weekly for bone health   Aim for 7-9 hours of sleep daily   When it comes to diets, agreement about the perfect plan isnt easy to find, even among the experts. Experts at the [employer] developed an idea known as the Healthy Eating Plate. Just imagine a plate divided into logical, healthy portions.   The emphasis is on diet quality:   Load up on vegetables and fruits - one-half of your plate: Aim for color and variety, and remember that potatoes dont count.   Go for whole grains - one-quarter of your plate: Whole wheat, barley, wheat berries, quinoa, oats, brown rice, and foods made with them. If you want pasta, go with whole wheat pasta.   Protein power - one-quarter of your plate: Fish, chicken, beans, and nuts are all healthy, versatile protein sources. Limit red meat.   The diet, however, does go beyond the plate, offering a few other suggestions.   Use healthy plant oils, such as olive, canola, soy, corn, sunflower and peanut. Check the labels, and avoid partially hydrogenated oil, which have unhealthy trans fats.   If youre thirsty, drink water. Coffee and tea are good in moderation, but skip sugary drinks and limit milk and dairy products to one or two daily servings.    The type of carbohydrate in the diet is more important than the amount. Some sources of carbohydrates, such as vegetables, fruits, whole grains, and beans-are healthier than others.   Finally, stay active  Signed, Berniece Salines, DO  06/11/2021 3:27 PM    [facility]

## 2021-06-12 LAB — BASIC METABOLIC PANEL
BUN/Creatinine Ratio: 9 — ABNORMAL LOW (ref 12–28)
BUN: 8 mg/dL (ref 8–27)
CO2: 25 mmol/L (ref 20–29)
Calcium: 9.1 mg/dL (ref 8.7–10.3)
Chloride: 102 mmol/L (ref 96–106)
Creatinine, Ser: 0.89 mg/dL (ref 0.57–1.00)
Glucose: 99 mg/dL (ref 65–99)
Potassium: 5.1 mmol/L (ref 3.5–5.2)
Sodium: 142 mmol/L (ref 134–144)
eGFR: 65 mL/min/{1.73_m2} (ref >59)

## 2021-06-12 LAB — MAGNESIUM: Magnesium: 2.2 mg/dL (ref 1.6–2.3)

## 2022-03-06 ENCOUNTER — Telehealth: Payer: Self-pay | Admitting: *Deleted

## 2022-03-06 ENCOUNTER — Telehealth: Payer: Self-pay | Admitting: Cardiology

## 2022-03-06 NOTE — Telephone Encounter (Signed)
Tele pre op appt 03/11/22 @ 10 am. Med rec and consent are done.  ? ?  ?Patient Consent for Virtual Visit  ? ? ?   ? ?Jennifer Melendez has provided verbal consent on 03/06/2022 for a virtual visit (video or telephone). ? ? ?CONSENT FOR VIRTUAL VISIT FOR:  Jennifer Melendez  ?By participating in this virtual visit I agree to the following: ? ?I hereby voluntarily request, consent and authorize [facility] and its employed or contracted physicians, physician assistants, nurse practitioners or other licensed health care professionals (the Practitioner), to provide me with telemedicine health care services (the ?Services") as deemed necessary by the treating Practitioner. I acknowledge and consent to receive the Services by the Practitioner via telemedicine. I understand that the telemedicine visit will involve communicating with the Practitioner through live audiovisual communication technology and the disclosure of certain medical information by electronic transmission. I acknowledge that I have been given the opportunity to request an in-person assessment or other available alternative prior to the telemedicine visit and am voluntarily participating in the telemedicine visit. ? ?I understand that I have the right to withhold or withdraw my consent to the use of telemedicine in the course of my care at any time, without affecting my right to future care or treatment, and that the Practitioner or I may terminate the telemedicine visit at any time. I understand that I have the right to inspect all information obtained and/or recorded in the course of the telemedicine visit and may receive copies of available information for a reasonable fee.  I understand that some of the potential risks of receiving the Services via telemedicine include:  ?Delay or interruption in medical evaluation due to technological equipment failure or disruption; ?Information transmitted may not be sufficient (e.g. poor resolution of images)  to allow for appropriate medical decision making by the Practitioner; and/or  ?In rare instances, security protocols could fail, causing a breach of personal health information. ? ?Furthermore, I acknowledge that it is my responsibility to provide information about my medical history, conditions and care that is complete and accurate to the best of my ability. I acknowledge that Practitioner's advice, recommendations, and/or decision may be based on factors not within their control, such as incomplete or inaccurate data provided by me or distortions of diagnostic images or specimens that may result from electronic transmissions. I understand that the practice of medicine is not an exact science and that Practitioner makes no warranties or guarantees regarding treatment outcomes. I acknowledge that a copy of this consent can be made available to me via my patient portal ([facility]), or I can request a printed copy by calling the office of [facility].   ? ?I understand that my insurance will be billed for this visit.  ? ?I have read or had this consent read to me. ?I understand the contents of this consent, which adequately explains the benefits and risks of the Services being provided via telemedicine.  ?I have been provided ample opportunity to ask questions regarding this consent and the Services and have had my questions answered to my satisfaction. ?I give my informed consent for the services to be provided through the use of telemedicine in my medical care ? ? ? ?

## 2022-03-06 NOTE — Telephone Encounter (Signed)
Preoperative team, please contact this patient and set up a phone call appointment for further cardiac evaluation.  Thank you for your help. ? ?Jossie Ng. Nikash Mortensen NP-C ? ?  ?03/06/2022, 3:29 PM ?[place] ?[address] ?Office [phone] Fax [phone] ? ?

## 2022-03-06 NOTE — Telephone Encounter (Signed)
Tele pre op appt 03/11/22 @ 10 am. Med rec and consent are done.  ?

## 2022-03-06 NOTE — Telephone Encounter (Signed)
? ?  Pre-operative Risk Assessment  ?  ?Patient Name: Jennifer Melendez  ?DOB: January 12, 1941 ?MRN: [id number]  ? ?  ? ?Request for Surgical Clearance   ? ?Procedure:   right knee arthoplasty ? ?Date of Surgery:  Clearance TBD                              ?   ?Surgeon:  Dr. Melrose Nakayama ?Surgeon's Group or Practice Name:  [facility] ?Phone number:  [phone] ?Fax number:  [phone] - scheduler: Marlin Canary ?Surgery Facility Fax - [facility]: [phone] ?  ?Type of Clearance Requested:   ?- Medical  ?  ?Type of Anesthesia:  Spinal ?  ?Additional requests/questions:   n/a ? ?Signed, ?Sheral Apley M   ?03/06/2022, 1:44 PM  ? ?

## 2022-03-11 ENCOUNTER — Ambulatory Visit (INDEPENDENT_AMBULATORY_CARE_PROVIDER_SITE_OTHER): Payer: Medicare Other | Admitting: Nurse Practitioner

## 2022-03-11 DIAGNOSIS — Z01810 Encounter for preprocedural cardiovascular examination: Secondary | ICD-10-CM | POA: Diagnosis not present

## 2022-03-11 NOTE — Progress Notes (Signed)
? ?Virtual Visit via Telephone Note  ? ?This visit type was conducted due to national recommendations for restrictions regarding the COVID-19 Pandemic (e.g. social distancing) in an effort to limit this patient's exposure and mitigate transmission in our community.  Due to her co-morbid illnesses, this patient is at least at moderate risk for complications without adequate follow up.  This format is felt to be most appropriate for this patient at this time.  The patient did not have access to video technology/had technical difficulties with video requiring transitioning to audio format only (telephone).  All issues noted in this document were discussed and addressed.  No physical exam could be performed with this format.  Please refer to the patient's chart for her  consent to telehealth for [facility]. ? ?Evaluation Performed:  Preoperative cardiovascular risk assessment ?_____________  ? ?Date:  03/11/2022  ? ?Patient ID:  Jennifer, Melendez 12-Apr-1941, MRN [id number] ?Patient Location:  ?Home ?Provider location:   ?Office ? ?Primary Care Provider:  Robyne Peers, MD ?Primary Cardiologist:  Berniece Salines, DO ? ?Chief Complaint  ?  ?81 y.o. y/o female with a h/o hypertension, hyperlipidemia, PVCs, PACs, and paroxysmal atrial tachycardia, who is pending right knee arthroplasty, date TBD, with Dr. Theone Stanley of [facility], and presents today for telephonic preoperative cardiovascular risk assessment. ? ?Past Medical History  ?  ?Past Medical History:  ?Diagnosis Date  ? Acute sphenoidal sinusitis 06/28/2014  ? Last Assessment & Plan:  Formatting of this note might be different from the original. Pt will continue nasal steroid. Z pack sent to pharmacy.  ? Axillary fullness 04/06/2017  ? 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 Cancer [facility])   ? Chronic fatigue 06/28/2014  ? Last Assessment & Plan:  Formatting of this note might be different from the original.  Psychological condition is worsening. May need to do sleep study to r/o OSA check labs today Psychological condition  will be reassessed in 3 months.  ? Constipation   ? Fibromyalgia   ? Gastritis 10/07/2016  ? Hx of bilateral breast implants 04/06/2017  ? Hyperlipemia 05/26/2014  ? Last Assessment & Plan:  Formatting of this note might be different from the original. Lipid abnormalities are unchanged. Pt off cholesterol medicine Nutritional counseling was provided. and Pharmacotherapy as ordered. Lipids will be reassessed in 6 months. Formatting of this note might be different from the original. Last Assessment & Plan:  Lipid abnormalities are unchanged. Pt off cholesterol m  ? Hyperlipidemia   ? Hypertension 05/26/2014  ? Last Assessment & Plan:  Formatting of this note might be different from the original. Hypertension is improving with treatment. Continue current treatment regimen. Dietary sodium restriction. Regular aerobic exercise. Blood pressure will be reassessed in 4 months. Formatting of this note might be different from the original. Last Assessment & Plan:  Hypertension is improving with treatment. Conti  ? 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 Last Assessment & Plan:  Formatting of this note might be different from the original. Headaches are worsening. Further diagnostic evaluation per orders. Pt needs brain MRI  ? Meningioma ([place]) 05/26/2014  ? Last Assessment & Plan:  Formatting of this note might be different from the original. Pt needs f/u brain MRI  ? Otalgia of left ear 04/16/2016  ? Other chest pain 06/10/2019  ? PAC (premature atrial contraction) 07/04/2020  ? Palpitations   ? PAT (paroxysmal atrial tachycardia) ([place]) 07/04/2020  ? Preventative health care 08/14/2015  ?  PVC (premature ventricular contraction)   ? PVC's (premature ventricular contractions) 06/10/2019  ? Seasonal allergies 05/26/2014  ? Last Assessment & Plan:  Formatting of this note might be different from the original. Pt  taking OTC allergy med Formatting of this note might be different from the original. Last Assessment & Plan:  Pt taking OTC allergy med  ? 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 ?Past Surgical History:  ?Procedure Laterality Date  ? BRAIN SURGERY    ? CHOLECYSTECTOMY    ? ? ?Allergies ? ?Allergies  ?Allergen Reactions  ? Ciprofloxacin Other (See Comments)  ? Diltiazem Hcl Er   ? Hydrocodone-Acetaminophen Other (See Comments)  ? Losartan Potassium   ? Morphine Nausea And Vomiting and Other (See Comments)  ? Morpholine Salicylate   ? Prednisolone   ? Prednisone Other (See Comments)  ?  Facial numbness  ? Gabapentin Other (See Comments) and Hives  ?  Hives ?  ? Ofloxacin Other (See Comments)  ?  Unknown ?  ? ? ?History of Present Illness  ?  ?Jennifer Melendez is a 81 y.o. female who presents via audio/video conferencing for a telehealth visit today.  Pt was last seen in cardiology clinic on 06/11/2021 by Dr. Harriet Masson.  At that time Jennifer Melendez was doing well a cardiac standpoint. The patient is now pending procedure as outlined above. Since her last visit, she has done well from a cardiac standpoint. She denies chest pain, palpitations, dyspnea, pnd, orthopnea, n, v, dizziness, syncope, edema, weight gain, or early satiety.  She reports feeling well overall and denies any new concerns today. ? ?Home Medications  ?  ?Prior to Admission medications   ?Medication Sig Start Date End Date Taking? Authorizing Provider  ?acetaminophen (TYLENOL) 325 MG tablet Take 650 mg by mouth every 6 (six) hours as needed for moderate pain.    [provider]  ?amitriptyline (ELAVIL) 10 MG tablet Take 10 mg by mouth at bedtime.    [provider]  ?dicyclomine (BENTYL) 10 MG capsule Take 10 mg by mouth 2 (two) times daily. 03/07/19   [provider]  ?esomeprazole (NEXIUM) 20 MG capsule Take 40 mg by mouth daily.    [provider]  ?metoprolol succinate (TOPROL-XL) 50 MG 24 hr tablet Take 50 mg by mouth  daily. 06/29/20   [provider]  ?pregabalin (LYRICA) 100 MG capsule Take 3 capsules every day as directed by physician. 11/04/19   [provider]  ?triamterene-hydrochlorothiazide (MAXZIDE-25) 37.5-25 MG tablet Take 1 tablet by mouth daily as needed.  01/06/20   [provider]  ?vitamin B-12 (CYANOCOBALAMIN) 1000 MCG tablet Take 1,000 mcg by mouth daily.    [provider]  ? ? ?Physical Exam  ?  ?Vital Signs:  Jennifer Melendez does not have vital signs available for review today. ? ?Given telephonic nature of communication, physical exam is limited. ?AAOx3. NAD. Normal affect.  Speech and respirations are unlabored. ? ?Accessory Clinical Findings  ?  ?None ? ?Assessment & Plan  ?  ?1.  Preoperative Cardiovascular Risk Assessment: ? ?According to the Revised Cardiac Risk Index (RCRI), her Perioperative Risk of Major Cardiac Event is (%): 0.4. Her Functional Capacity in METs is: 5.72 according to the Duke Activity Status Index (DASI). Therefore, based on ACC/AHA guidelines, patient would be at acceptable risk for the planned procedure without further cardiovascular testing.  ? ?A copy of this note will be routed to requesting surgeon. ? ?Time:   ?  Today, I have spent 9 minutes with the patient with telehealth technology discussing medical history, symptoms, and management plan.   ? ? ?Lenna Sciara, NP ? ?03/11/2022, 10:14 AM ? ?

## 2022-03-21 NOTE — Patient Instructions (Signed)
DUE TO COVID-19 ONLY TWO VISITORS  (aged 81 and older)  ARE ALLOWED TO COME WITH YOU AND STAY IN THE WAITING ROOM ONLY DURING PRE OP AND PROCEDURE.   **NO VISITORS ARE ALLOWED IN THE SHORT STAY AREA OR RECOVERY ROOM!!**    Your procedure is scheduled on: 04-01-22   Report to [facility] Main Entrance    Report to admitting at    1125AM   Call this number if you have problems the morning of surgery [phone]   Do not eat food :After Midnight.   After Midnight you may have the following liquids until __1100___ AM DAY OF SURGERY   Then nothing by mouth  Water Black Coffee (sugar ok, NO MILK/CREAM OR CREAMERS)  Tea (sugar ok, NO MILK/CREAM OR CREAMERS) regular and decaf                             Plain Jell-O (NO RED)                                           Fruit ices (not with fruit pulp, NO RED)                                     Popsicles (NO RED)                                                                  Juice: apple, WHITE grape, WHITE cranberry Sports drinks like Gatorade (NO RED) Clear broth(vegetable,chicken,beef)                   FOLLOW ANY ADDITIONAL PRE OP INSTRUCTIONS YOU RECEIVED FROM YOUR SURGEON'S OFFICE!!!     Oral Hygiene is also important to reduce your risk of infection.                                    Remember - BRUSH YOUR TEETH THE MORNING OF SURGERY WITH YOUR REGULAR TOOTHPASTE   Do NOT smoke after Midnight   Take these medicines the morning of surgery with A SIP OF WATER: metoprolol, dicyclomine,  Lyrica, amitriptyline, pepcid   Bring CPAP mask and tubing day of surgery.                              You may not have any metal on your body including hair pins, jewelry, and body piercing             Do not wear make-up, lotions, powders, perfumes/cologne, or deodorant  Do not wear nail polish including gel and S&S, artificial/acrylic nails, or any other type of covering on natural nails including finger and toenails. If you have  artificial nails, gel coating, etc. that needs to be removed by a nail salon please have this removed prior to surgery or surgery may need to be canceled/ delayed if the surgeon/ anesthesia feels like they are  unable to be safely monitored.   Do not shave  48 hours prior to surgery.               Do not bring valuables to the hospital. [place].   Contacts, dentures or bridgework may not be worn into surgery.   Bring small overnight bag day of surgery.    Patients discharged on the day of surgery will not be allowed to drive home.  Someone NEEDS to stay with you for the first 24 hours after anesthesia.                Please read over the following fact sheets you were given: IF YOU HAVE QUESTIONS ABOUT YOUR PRE-OP INSTRUCTIONS PLEASE CALL [phone]     [facility] - Preparing for Surgery Before surgery, you can play an important role.  Because skin is not sterile, your skin needs to be as free of germs as possible.  You can reduce the number of germs on your skin by washing with CHG (chlorahexidine gluconate) soap before surgery.  CHG is an antiseptic cleaner which kills germs and bonds with the skin to continue killing germs even after washing. Please DO NOT use if you have an allergy to CHG or antibacterial soaps.  If your skin becomes reddened/irritated stop using the CHG and inform your nurse when you arrive at Short Stay. Do not shave (including legs and underarms) for at least 48 hours prior to the first CHG shower.  You may shave your face/neck. Please follow these instructions carefully:  1.  Shower with CHG Soap the night before surgery and the  morning of Surgery.  2.  If you choose to wash your hair, wash your hair first as usual with your  normal  shampoo.  3.  After you shampoo, rinse your hair and body thoroughly to remove the  shampoo.                           4.  Use CHG as you would any other liquid soap.  You can apply  chg directly  to the skin and wash                       Gently with a scrungie or clean washcloth.  5.  Apply the CHG Soap to your body ONLY FROM THE NECK DOWN.   Do not use on face/ open                           Wound or open sores. Avoid contact with eyes, ears mouth and genitals (private parts).                       Wash face,  Genitals (private parts) with your normal soap.             6.  Wash thoroughly, paying special attention to the area where your surgery  will be performed.  7.  Thoroughly rinse your body with warm water from the neck down.  8.  DO NOT shower/wash with your normal soap after using and rinsing off  the CHG Soap.                9.  Pat yourself dry with a clean  towel.            10.  Wear clean pajamas.            11.  Place clean sheets on your bed the night of your first shower and do not  sleep with pets. Day of Surgery : Do not apply any lotions/deodorants the morning of surgery.  Please wear clean clothes to the hospital/surgery center.  FAILURE TO FOLLOW THESE INSTRUCTIONS MAY RESULT IN THE CANCELLATION OF YOUR SURGERY PATIENT SIGNATURE_________________________________  NURSE SIGNATURE__________________________________  ________________________________________________________________________   Jennifer Melendez  An incentive spirometer is a tool that can help keep your lungs clear and active. This tool measures how well you are filling your lungs with each breath. Taking long deep breaths may help reverse or decrease the chance of developing breathing (pulmonary) problems (especially infection) following: A long period of time when you are unable to move or be active. BEFORE THE PROCEDURE  If the spirometer includes an indicator to show your best effort, your nurse or respiratory therapist will set it to a desired goal. If possible, sit up straight or lean slightly forward. Try not to slouch. Hold the incentive spirometer in an upright  position. INSTRUCTIONS FOR USE  Sit on the edge of your bed if possible, or sit up as far as you can in bed or on a chair. Hold the incentive spirometer in an upright position. Breathe out normally. Place the mouthpiece in your mouth and seal your lips tightly around it. Breathe in slowly and as deeply as possible, raising the piston or the ball toward the top of the column. Hold your breath for 3-5 seconds or for as long as possible. Allow the piston or ball to fall to the bottom of the column. Remove the mouthpiece from your mouth and breathe out normally. Rest for a few seconds and repeat Steps 1 through 7 at least 10 times every 1-2 hours when you are awake. Take your time and take a few normal breaths between deep breaths. The spirometer may include an indicator to show your best effort. Use the indicator as a goal to work toward during each repetition. After each set of 10 deep breaths, practice coughing to be sure your lungs are clear. If you have an incision (the cut made at the time of surgery), support your incision when coughing by placing a pillow or rolled up towels firmly against it. Once you are able to get out of bed, walk around indoors and cough well. You may stop using the incentive spirometer when instructed by your caregiver.  RISKS AND COMPLICATIONS Take your time so you do not get dizzy or light-headed. If you are in pain, you may need to take or ask for pain medication before doing incentive spirometry. It is harder to take a deep breath if you are having pain. AFTER USE Rest and breathe slowly and easily. It can be helpful to keep track of a log of your progress. Your caregiver can provide you with a simple table to help with this. If you are using the spirometer at home, follow these instructions: [place] IF:  You are having difficultly using the spirometer. You have trouble using the spirometer as often as instructed. Your pain medication is not giving  enough relief while using the spirometer. You develop fever of 100.5 F (38.1 C) or higher. SEEK IMMEDIATE MEDICAL CARE IF:  You cough up bloody sputum that had not been present before. You develop fever of  102 F (38.9 C) or greater. You develop worsening pain at or near the incision site. MAKE SURE YOU:  Understand these instructions. Will watch your condition. Will get help right away if you are not doing well or get worse. Document Released: 02/23/2007 Document Revised: 01/05/2012 Document Reviewed: 04/26/2007 [facility] Patient Information 2014 [place], [place].   ________________________________________________________________________

## 2022-03-21 NOTE — Progress Notes (Signed)
Please place orders in epic for preop 

## 2022-03-21 NOTE — Progress Notes (Signed)
PCP - Dr. Harriet Masson clearance 03-18-22 epic Cardiologist - Dr. Harriet Masson Clearance Jennifer Browner, NP 03-11-22 epic  PPM/ICD -  Device Orders -  Rep Notified -   Chest x-ray -  EKG - 06-11-21 epic Stress Test - 2021 epic ECHO - 2021 epic Cardiac Cath -   Sleep Study -  CPAP -   Fasting Blood Sugar -  Checks Blood Sugar _____ times a day  Blood Thinner Instructions: Aspirin Instructions:  ERAS Protcol - PRE-SURGERY NO orders in epic at preop   COVID vaccine -Moderna x 2 + 1 booster had a reaction  Activity--Able to walk a flight of stairs without SOB Anesthesia review: HTN PVC  Patient denies shortness of breath, fever, cough and chest pain at PAT appointment   All instructions explained to the patient, with a verbal understanding of the material. Patient agrees to go over the instructions while at home for a better understanding. Patient also instructed to self quarantine after being tested for COVID-19. The opportunity to ask questions was provided.

## 2022-03-25 ENCOUNTER — Encounter (HOSPITAL_COMMUNITY): Payer: Self-pay

## 2022-03-25 ENCOUNTER — Other Ambulatory Visit: Payer: Self-pay | Admitting: Orthopaedic Surgery

## 2022-03-25 ENCOUNTER — Encounter (HOSPITAL_COMMUNITY)
Admission: RE | Admit: 2022-03-25 | Discharge: 2022-03-25 | Disposition: A | Discharge status: Home / Self Care | Payer: Medicare Other | Source: Ambulatory Visit | Attending: Orthopaedic Surgery | Admitting: Orthopaedic Surgery

## 2022-03-25 VITALS — BP 153/74 | Temp 98.2°F | Resp 16 | Ht 67.5 in | Wt 181.0 lb

## 2022-03-25 DIAGNOSIS — Z01812 Encounter for preprocedural laboratory examination: Secondary | ICD-10-CM | POA: Diagnosis present

## 2022-03-25 DIAGNOSIS — I10 Essential (primary) hypertension: Secondary | ICD-10-CM | POA: Diagnosis not present

## 2022-03-25 DIAGNOSIS — Z01818 Encounter for other preprocedural examination: Secondary | ICD-10-CM

## 2022-03-25 HISTORY — DX: Anemia, unspecified: D64.9

## 2022-03-25 HISTORY — DX: Headache, unspecified: R51.9

## 2022-03-25 LAB — BASIC METABOLIC PANEL
Anion gap: 9 (ref 5–15)
BUN: 10 mg/dL (ref 8–23)
CO2: 24 mmol/L (ref 22–32)
Calcium: 9.3 mg/dL (ref 8.9–10.3)
Chloride: 106 mmol/L (ref 98–111)
Creatinine, Ser: 0.99 mg/dL (ref 0.44–1.00)
GFR, Estimated: 57 mL/min — ABNORMAL LOW (ref >60)
Glucose, Bld: 100 mg/dL — ABNORMAL HIGH (ref 70–99)
Potassium: 4.3 mmol/L (ref 3.5–5.1)
Sodium: 139 mmol/L (ref 135–145)

## 2022-03-25 LAB — CBC
HCT: 47.9 % — ABNORMAL HIGH (ref 36.0–46.0)
Hemoglobin: 15.5 g/dL — ABNORMAL HIGH (ref 12.0–15.0)
MCH: 31.2 pg (ref 26.0–34.0)
MCHC: 32.4 g/dL (ref 30.0–36.0)
MCV: 96.4 fL (ref 80.0–100.0)
Platelets: 235 10*3/uL (ref 150–400)
RBC: 4.97 MIL/uL (ref 3.87–5.11)
RDW: 14.2 % (ref 11.5–15.5)
WBC: 9.8 10*3/uL (ref 4.0–10.5)
nRBC: 0 % (ref 0.0–0.2)

## 2022-03-25 LAB — SURGICAL PCR SCREEN
MRSA, PCR: NEGATIVE
Staphylococcus aureus: NEGATIVE

## 2022-03-31 ENCOUNTER — Other Ambulatory Visit: Payer: Self-pay | Admitting: Orthopaedic Surgery

## 2022-03-31 NOTE — H&P (Signed)
TOTAL KNEE ADMISSION H&P  Patient is being admitted for right total knee arthroplasty.  Subjective:  Chief Complaint:right knee pain.  HPI: Jennifer Melendez, 81 y.o. female, has a history of pain and functional disability in the right knee due to arthritis and has failed non-surgical conservative treatments for greater than 12 weeks to includeNSAID's and/or analgesics, corticosteriod injections, viscosupplementation injections, flexibility and strengthening excercises, supervised PT with diminished ADL's post treatment, use of assistive devices, weight reduction as appropriate, and activity modification.  Onset of symptoms was gradual, starting 5 years ago with gradually worsening course since that time. The patient noted no past surgery on the right knee(s).  Patient currently rates pain in the right knee(s) at 10 out of 10 with activity. Patient has night pain, worsening of pain with activity and weight bearing, pain that interferes with activities of daily living, crepitus, and joint swelling.  Patient has evidence of subchondral cysts, subchondral sclerosis, periarticular osteophytes, and joint space narrowing by imaging studies. There is no active infection.  Patient Active Problem List   Diagnosis Date Noted   Family history of malignant neoplasm of gastrointestinal tract 12/10/2020   Fecal impaction ([place]) 12/10/2020   Hemorrhage of rectum and anus 12/10/2020   PVC (premature ventricular contraction)    Palpitations    Hyperlipidemia    Cancer (HCC)    PAT (paroxysmal atrial tachycardia) ([place]) 07/04/2020   PAC (premature atrial contraction) 07/04/2020   Other chest pain 06/10/2019   PVC's (premature ventricular contractions) 06/10/2019   Axillary fullness 04/06/2017   Hx of bilateral breast implants 04/06/2017   Constipation 10/07/2016   Gastritis 10/07/2016   Otalgia of left ear 04/16/2016   Tinnitus of both ears 04/16/2016   Preventative health care 08/14/2015   Barrett's  esophagus with esophagitis 07/04/2015   Acute sphenoidal sinusitis 06/28/2014   Chronic fatigue 06/28/2014   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 Pt will continue nasal steroid. Z pack sent to pharmacy.   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 Psychological condition is worsening. May need to do sleep study to r/o OSA check labs today Psychological condition  will be reassessed in 3 months.   Constipation    Fibromyalgia    Gastritis 10/07/2016   Headache    Hx of bilateral breast implants 04/06/2017   Hyperlipemia 05/26/2014   Last Assessment & Plan:  Formatting of this note might be different from the original. Lipid abnormalities are unchanged. Pt off cholesterol medicine Nutritional counseling was provided. and Pharmacotherapy as ordered. Lipids will be reassessed in 6 months. Formatting of this note might be different from the original. Last Assessment & Plan:  Lipid abnormalities are unchanged. Pt off cholesterol m   Hyperlipidemia    Hypertension 05/26/2014   Last Assessment & Plan:  Formatting of this note might be different from the original. Hypertension is improving with treatment. Continue current treatment regimen. Dietary sodium restriction. Regular aerobic exercise. Blood pressure will be reassessed in 4 months. Formatting of  this note might be different from the original. Last Assessment & Plan:  Hypertension is improving with treatment. Conti   Meningioma  [facility]) 05/26/2014   Last Assessment & Plan:  Formatting of this note might be different from the original. Pt needs f/u brain MRI   Otalgia of left ear 04/16/2016   Other chest pain 06/10/2019   PAC (premature atrial contraction) 07/04/2020   Palpitations    PAT (paroxysmal atrial tachycardia) ([place]) 07/04/2020   Preventative health care 08/14/2015   PVC (premature ventricular contraction)    PVC's (premature ventricular contractions) 06/10/2019   Seasonal allergies 05/26/2014   Last Assessment & Plan:  Formatting of this note might be different from the original. Pt taking OTC allergy med Formatting of this note might be different from the original. 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    Past Surgical History:  Procedure Laterality Date   BACK SURGERY     herniated disc L 5   BRAIN SURGERY  2000   menigioma   BREAST SURGERY     1977 implants Left lump removed benign   CHOLECYSTECTOMY  1994   TUBAL LIGATION     varicose vein surgery      No current facility-administered medications for this encounter.   Current Outpatient Medications  Medication Sig Dispense Refill Last Dose   acetaminophen (TYLENOL) 500 MG tablet Take 500 mg by mouth 2 (two) times daily as needed (knee pain).      amitriptyline (ELAVIL) 10 MG tablet Take 10 mg by mouth in the morning and at bedtime.      azelastine (ASTELIN) 0.1 % nasal spray Place 1 spray into both nostrils 2 (two) times daily. Use in each nostril as directed (Patient not taking: Reported on 03/25/2022)      Cholecalciferol (VITAMIN D3 PO) Take 1,000 mcg by mouth daily. When able to remember (Patient not taking: Reported on 03/25/2022)      diclofenac Sodium (VOLTAREN) 1 % GEL Apply 1 application. topically See admin instructions. 1 application 2-3 times a day      dicyclomine (BENTYL) 10 MG capsule Take 10 mg by mouth 2 (two) times daily.      famotidine (PEPCID) 20 MG tablet Take 20-60 mg by mouth  daily.      metoprolol succinate (TOPROL-XL) 50 MG 24 hr tablet Take 50 mg by mouth daily.      OVER THE COUNTER MEDICATION Take 1 tablet by mouth See admin instructions. 1 chew twice every other day or when able to remember.  Beet chews      Polyvinyl Alcohol-Povidone (REFRESH OP) Place 1 drop into both eyes daily as needed (dry eye).      pregabalin (LYRICA) 100 MG capsule Take 100 mg by mouth 3 (three) times daily.      triamterene-hydrochlorothiazide (MAXZIDE-25) 37.5-25 MG tablet Take 1 tablet by mouth daily as needed (fluid).      Trolamine Salicylate (ASPERCREME EX) Apply 1 application. topically daily as needed (knee pain).      vitamin B-12 (CYANOCOBALAMIN) 1000 MCG tablet Take 1,000 mcg by mouth daily. (Patient not taking: Reported on 03/25/2022)      Allergies  Allergen Reactions   Amlodipine Besylate Other (See Comments)    Unknown Maybe body shaking    Ciprofloxacin Other (See Comments)    unknown   Diltiazem Hcl Er Other (See Comments)    Shaking of arms, eyes not focusing, stuffy  Hydrocodone-Acetaminophen Other (See Comments)    unknown   Morphine Nausea And Vomiting and Other (See Comments)    Could not stand up   Morpholine Salicylate Other (See Comments)    Could not stand up   Prednisolone Other (See Comments)    unknown   Gabapentin Other (See Comments) and Hives    Hives    Losartan Potassium Palpitations   Ofloxacin Other (See Comments)    "Felt like she was dying"    Prednisone Rash and Other (See Comments)    Facial numbness    Social History   Tobacco Use   Smoking status: Never   Smokeless tobacco: Never  Substance Use Topics   Alcohol use: Never    Family History  Problem Relation Age of Onset   Hypertension Mother    Lung cancer Mother    Cancer Father    Diabetes Sister    Heart attack Sister    Leukemia Brother    Diabetes Maternal Grandmother      Review of Systems  Musculoskeletal:  Positive for arthralgias.       Right knee   All other systems reviewed and are negative.  Objective:  Physical Exam Constitutional:      Appearance: Normal appearance.  HENT:     Head: Normocephalic and atraumatic.     Nose: Nose normal.     Mouth/Throat:     Pharynx: Oropharynx is clear.  Eyes:     Extraocular Movements: Extraocular movements intact.  Cardiovascular:     Rate and Rhythm: Normal rate and regular rhythm.  Pulmonary:     Effort: Pulmonary effort is normal.  Abdominal:     Palpations: Abdomen is soft.  Musculoskeletal:     Comments: Right knee motion is 0-100.  She has medial joint line pain and crepitation.  There is no effusion.  Hip motion is full and straight leg raise is negative.  Sensation and motor function are intact distally and she has palpable pulses in her feet.    Skin:    General: Skin is warm and dry.  Neurological:     General: No focal deficit present.     Mental Status: She is alert and oriented to person, place, and time.  Psychiatric:        Mood and Affect: Mood normal.        Behavior: Behavior normal.        Thought Content: Thought content normal.        Judgment: Judgment normal.    Vital signs in last 24 hours:    Labs:   Estimated body mass index is 27.93 kg/m as calculated from the following:   Height as of 03/25/22: 5' 7.5" (1.715 m).   Weight as of 03/25/22: 82.1 kg.   Imaging Review Plain radiographs demonstrate severe degenerative joint disease of the right knee(s). The overall alignment isneutral. The bone quality appears to be good for age and reported activity level.      Assessment/Plan:  End stage primary arthritis, right knee   The patient history, physical examination, clinical judgment of the provider and imaging studies are consistent with end stage degenerative joint disease of the right knee(s) and total knee arthroplasty is deemed medically necessary. The treatment options including medical management, injection therapy arthroscopy and  arthroplasty were discussed at length. The risks and benefits of total knee arthroplasty were presented and reviewed. The risks due to aseptic loosening, infection, stiffness, patella tracking problems, thromboembolic complications and other imponderables  were discussed. The patient acknowledged the explanation, agreed to proceed with the plan and consent was signed. Patient is being admitted for inpatient treatment for surgery, pain control, PT, OT, prophylactic antibiotics, VTE prophylaxis, progressive ambulation and ADL's and discharge planning. The patient is planning to be discharged home with home health services     Patient's anticipated LOS is less than 2 midnights, meeting these requirements: - Younger than 21 - Lives within 1 hour of care - Has a competent adult at home to recover with post-op recover - NO history of  - Chronic pain requiring opiods  - Diabetes  - Coronary Artery Disease  - Heart failure  - Heart attack  - Stroke  - DVT/VTE  - Cardiac arrhythmia  - Respiratory Failure/COPD  - Renal failure  - Anemia  - Advanced Liver disease

## 2022-03-31 NOTE — Anesthesia Preprocedure Evaluation (Signed)
Anesthesia Evaluation  Patient identified by MRN, date of birth, ID band Patient awake    Reviewed: Allergy & Precautions, NPO status , Patient's Chart, lab work & pertinent test results  Airway Mallampati: II  TM Distance: >3 FB Neck ROM: Full    Dental no notable dental hx. (+) Teeth Intact, Dental Advisory Given   Pulmonary neg pulmonary ROS,    Pulmonary exam normal breath sounds clear to auscultation       Cardiovascular hypertension, Normal cardiovascular exam Rhythm:Regular Rate:Normal  02/2020 TTE  1. Left ventricular ejection fraction, by estimation, is 60 to 65%. The  left ventricle has normal function. The left ventricle has no regional  wall motion abnormalities. There is mild concentric left ventricular  hypertrophy. Left ventricular diastolic  parameters are consistent with Grade I diastolic dysfunction (impaired  relaxation).  2. Right ventricular systolic function is normal. The right ventricular  size is normal. There is moderately elevated pulmonary artery systolic  pressure. The estimated right ventricular systolic pressure is moderately  elevated 54.0 mmHg.  3. The mitral valve is normal in structure. No evidence of mitral valve  regurgitation. No evidence of mitral stenosis.  4. The aortic valve is tricuspid. Aortic valve regurgitation is not  visualized. No aortic stenosis is present.  5. The inferior vena cava is normal in size with greater than 50%  respiratory variability, suggesting right atrial pressure of 3 mmHg.    Neuro/Psych  Headaches,    GI/Hepatic   Endo/Other  negative endocrine ROS  Renal/GU Lab Results      Component                Value               Date                      CREATININE               0.99                03/25/2022                K                        4.3                 03/25/2022                   Musculoskeletal  (+) Fibromyalgia -  Abdominal   Peds   Hematology Lab Results      Component                Value               Date                      WBC                      9.8                 03/25/2022                HGB                      15.5 (H)            03/25/2022  HCT                      47.9 (H)            03/25/2022                MCV                      96.4                03/25/2022                PLT                      235                 03/25/2022              Anesthesia Other Findings All: see list  Reproductive/Obstetrics                           Anesthesia Physical Anesthesia Plan  ASA: 3  Anesthesia Plan: Spinal and Regional   Post-op Pain Management: Regional block*   Induction: Intravenous  PONV Risk Score and Plan: 3 and Treatment may vary due to age or medical condition and Ondansetron  Airway Management Planned: Nasal Cannula and Natural Airway  Additional Equipment: None  Intra-op Plan:   Post-operative Plan:   Informed Consent: I have reviewed the patients History and Physical, chart, labs and discussed the procedure including the risks, benefits and alternatives for the proposed anesthesia with the patient or authorized representative who has indicated his/her understanding and acceptance.     Dental advisory given  Plan Discussed with:   Anesthesia Plan Comments: (Spinal w R Adductor)      Anesthesia Quick Evaluation

## 2022-04-01 ENCOUNTER — Ambulatory Visit (HOSPITAL_BASED_OUTPATIENT_CLINIC_OR_DEPARTMENT_OTHER): Payer: Medicare Other | Admitting: Anesthesiology

## 2022-04-01 ENCOUNTER — Encounter (HOSPITAL_COMMUNITY)
Admission: AD | Disposition: A | Discharge status: Home / Self Care | Payer: Self-pay | Source: Home / Self Care | Attending: Orthopaedic Surgery

## 2022-04-01 ENCOUNTER — Ambulatory Visit (HOSPITAL_COMMUNITY): Payer: Medicare Other | Admitting: Anesthesiology

## 2022-04-01 ENCOUNTER — Encounter (HOSPITAL_COMMUNITY): Payer: Self-pay | Admitting: Orthopaedic Surgery

## 2022-04-01 ENCOUNTER — Inpatient Hospital Stay (HOSPITAL_COMMUNITY)
Admission: AD | Admit: 2022-04-01 | Discharge: 2022-04-05 | DRG: 470 | Disposition: A | Discharge status: Home / Self Care | Payer: Medicare Other | Attending: Orthopaedic Surgery | Admitting: Orthopaedic Surgery

## 2022-04-01 ENCOUNTER — Other Ambulatory Visit: Payer: Self-pay

## 2022-04-01 DIAGNOSIS — Z8249 Family history of ischemic heart disease and other diseases of the circulatory system: Secondary | ICD-10-CM

## 2022-04-01 DIAGNOSIS — M1711 Unilateral primary osteoarthritis, right knee: Secondary | ICD-10-CM

## 2022-04-01 DIAGNOSIS — Z79899 Other long term (current) drug therapy: Secondary | ICD-10-CM

## 2022-04-01 DIAGNOSIS — I10 Essential (primary) hypertension: Secondary | ICD-10-CM | POA: Diagnosis not present

## 2022-04-01 DIAGNOSIS — Z833 Family history of diabetes mellitus: Secondary | ICD-10-CM

## 2022-04-01 DIAGNOSIS — Z01818 Encounter for other preprocedural examination: Secondary | ICD-10-CM

## 2022-04-01 DIAGNOSIS — M797 Fibromyalgia: Secondary | ICD-10-CM | POA: Diagnosis present

## 2022-04-01 DIAGNOSIS — G8929 Other chronic pain: Secondary | ICD-10-CM | POA: Diagnosis present

## 2022-04-01 DIAGNOSIS — E119 Type 2 diabetes mellitus without complications: Secondary | ICD-10-CM | POA: Diagnosis present

## 2022-04-01 DIAGNOSIS — R5382 Chronic fatigue, unspecified: Secondary | ICD-10-CM | POA: Diagnosis present

## 2022-04-01 DIAGNOSIS — Z86011 Personal history of benign neoplasm of the brain: Secondary | ICD-10-CM

## 2022-04-01 DIAGNOSIS — K2270 Barrett's esophagus without dysplasia: Secondary | ICD-10-CM | POA: Diagnosis present

## 2022-04-01 DIAGNOSIS — Z9049 Acquired absence of other specified parts of digestive tract: Secondary | ICD-10-CM

## 2022-04-01 DIAGNOSIS — K209 Esophagitis, unspecified without bleeding: Secondary | ICD-10-CM | POA: Diagnosis present

## 2022-04-01 DIAGNOSIS — Z888 Allergy status to other drugs, medicaments and biological substances status: Secondary | ICD-10-CM

## 2022-04-01 DIAGNOSIS — E785 Hyperlipidemia, unspecified: Secondary | ICD-10-CM | POA: Diagnosis present

## 2022-04-01 DIAGNOSIS — Z881 Allergy status to other antibiotic agents status: Secondary | ICD-10-CM

## 2022-04-01 DIAGNOSIS — Z9882 Breast implant status: Secondary | ICD-10-CM

## 2022-04-01 DIAGNOSIS — Z885 Allergy status to narcotic agent status: Secondary | ICD-10-CM

## 2022-04-01 HISTORY — PX: TOTAL KNEE ARTHROPLASTY: SHX125

## 2022-04-01 LAB — ABO/RH: ABO/RH(D): A POS

## 2022-04-01 LAB — TYPE AND SCREEN
ABO/RH(D): A POS
Antibody Screen: NEGATIVE

## 2022-04-01 SURGERY — ARTHROPLASTY, KNEE, TOTAL
Anesthesia: Regional | Site: Knee | Laterality: Right

## 2022-04-01 MED ORDER — ACETAMINOPHEN 500 MG PO TABS
500.0000 mg | ORAL_TABLET | Freq: Four times a day (QID) | ORAL | Status: AC
  Administered 2022-04-01 – 2022-04-02 (×2): 500 mg via ORAL
  Filled 2022-04-01 (×2): qty 1

## 2022-04-01 MED ORDER — SODIUM CHLORIDE (PF) 0.9 % IJ SOLN
INTRAMUSCULAR | Status: AC
  Filled 2022-04-01: qty 30

## 2022-04-01 MED ORDER — ONDANSETRON HCL 4 MG/2ML IJ SOLN
INTRAMUSCULAR | Status: DC | PRN
  Administered 2022-04-01: 4 mg via INTRAVENOUS

## 2022-04-01 MED ORDER — ACETAMINOPHEN 10 MG/ML IV SOLN: 1000.0000 mg | Freq: Once | INTRAVENOUS | Status: DC | PRN

## 2022-04-01 MED ORDER — BUPIVACAINE LIPOSOME 1.3 % IJ SUSP
INTRAMUSCULAR | Status: AC
  Filled 2022-04-01: qty 20

## 2022-04-01 MED ORDER — DIPHENHYDRAMINE HCL 12.5 MG/5ML PO ELIX: 12.5000 mg | ORAL_SOLUTION | ORAL | Status: DC | PRN

## 2022-04-01 MED ORDER — METOCLOPRAMIDE HCL 5 MG/ML IJ SOLN: 5.0000 mg | Freq: Three times a day (TID) | INTRAMUSCULAR | Status: DC | PRN

## 2022-04-01 MED ORDER — HYDROMORPHONE HCL 1 MG/ML IJ SOLN
0.5000 mg | Freq: Once | INTRAMUSCULAR | Status: AC
  Administered 2022-04-01: 1 mg via INTRAVENOUS

## 2022-04-01 MED ORDER — KETOROLAC TROMETHAMINE 15 MG/ML IJ SOLN
7.5000 mg | Freq: Four times a day (QID) | INTRAMUSCULAR | Status: AC
  Administered 2022-04-01 – 2022-04-02 (×2): 7.5 mg via INTRAVENOUS
  Filled 2022-04-01 (×2): qty 1

## 2022-04-01 MED ORDER — HYDROCODONE-ACETAMINOPHEN 5-325 MG PO TABS: 1.0000 | ORAL_TABLET | Freq: Four times a day (QID) | ORAL | 0 refills | Status: DC | PRN

## 2022-04-01 MED ORDER — CEFAZOLIN SODIUM-DEXTROSE 2-4 GM/100ML-% IV SOLN
2.0000 g | Freq: Four times a day (QID) | INTRAVENOUS | Status: AC
  Administered 2022-04-01 (×2): 2 g via INTRAVENOUS
  Filled 2022-04-01 (×2): qty 100

## 2022-04-01 MED ORDER — BUPIVACAINE LIPOSOME 1.3 % IJ SUSP
INTRAMUSCULAR | Status: DC | PRN
  Administered 2022-04-01: 20 mL

## 2022-04-01 MED ORDER — STERILE WATER FOR IRRIGATION IR SOLN
Status: DC | PRN
  Administered 2022-04-01 (×2): 1000 mL

## 2022-04-01 MED ORDER — LACTATED RINGERS IV BOLUS: 250.0000 mL | Freq: Once | INTRAVENOUS | Status: DC

## 2022-04-01 MED ORDER — MENTHOL 3 MG MT LOZG: 1.0000 | LOZENGE | OROMUCOSAL | Status: DC | PRN

## 2022-04-01 MED ORDER — FENTANYL CITRATE PF 50 MCG/ML IJ SOSY: 25.0000 ug | PREFILLED_SYRINGE | INTRAMUSCULAR | Status: DC | PRN

## 2022-04-01 MED ORDER — TIZANIDINE HCL 4 MG PO TABS
4.0000 mg | ORAL_TABLET | Freq: Four times a day (QID) | ORAL | 1 refills | Status: DC | PRN
Start: 2022-04-01 — End: 2022-04-03

## 2022-04-01 MED ORDER — SODIUM CHLORIDE (PF) 0.9 % IJ SOLN
INTRAMUSCULAR | Status: DC | PRN
  Administered 2022-04-01: 30 mL via INTRAVENOUS

## 2022-04-01 MED ORDER — BUPIVACAINE-EPINEPHRINE (PF) 0.5% -1:200000 IJ SOLN
INTRAMUSCULAR | Status: AC
  Filled 2022-04-01: qty 30

## 2022-04-01 MED ORDER — PREGABALIN 100 MG PO CAPS
100.0000 mg | ORAL_CAPSULE | Freq: Three times a day (TID) | ORAL | Status: DC
Start: 2022-04-01 — End: 2022-04-05
  Administered 2022-04-01 – 2022-04-05 (×9): 100 mg via ORAL
  Filled 2022-04-01 (×11): qty 1

## 2022-04-01 MED ORDER — METHOCARBAMOL 500 MG IVPB - SIMPLE MED: 500.0000 mg | Freq: Four times a day (QID) | INTRAVENOUS | Status: DC | PRN

## 2022-04-01 MED ORDER — TRANEXAMIC ACID-NACL 1000-0.7 MG/100ML-% IV SOLN
1000.0000 mg | INTRAVENOUS | Status: AC
  Administered 2022-04-01: 1000 mg via INTRAVENOUS
  Filled 2022-04-01: qty 100

## 2022-04-01 MED ORDER — METHOCARBAMOL 500 MG PO TABS
500.0000 mg | ORAL_TABLET | Freq: Four times a day (QID) | ORAL | Status: DC | PRN
  Administered 2022-04-01 – 2022-04-03 (×4): 500 mg via ORAL
  Filled 2022-04-01 (×4): qty 1

## 2022-04-01 MED ORDER — ORAL CARE MOUTH RINSE: 15.0000 mL | Freq: Once | OROMUCOSAL | Status: AC

## 2022-04-01 MED ORDER — DOCUSATE SODIUM 100 MG PO CAPS
100.0000 mg | ORAL_CAPSULE | Freq: Two times a day (BID) | ORAL | Status: DC
  Administered 2022-04-01 – 2022-04-05 (×8): 100 mg via ORAL
  Filled 2022-04-01 (×8): qty 1

## 2022-04-01 MED ORDER — CEFAZOLIN SODIUM-DEXTROSE 2-4 GM/100ML-% IV SOLN
INTRAVENOUS | Status: AC
  Filled 2022-04-01: qty 100

## 2022-04-01 MED ORDER — ONDANSETRON HCL 4 MG/2ML IJ SOLN
INTRAMUSCULAR | Status: AC
  Filled 2022-04-01: qty 2

## 2022-04-01 MED ORDER — MIDAZOLAM HCL 2 MG/2ML IJ SOLN
INTRAMUSCULAR | Status: AC
  Filled 2022-04-01: qty 2

## 2022-04-01 MED ORDER — PHENOL 1.4 % MT LIQD: 1.0000 | OROMUCOSAL | Status: DC | PRN

## 2022-04-01 MED ORDER — FENTANYL CITRATE (PF) 100 MCG/2ML IJ SOLN
INTRAMUSCULAR | Status: AC
  Filled 2022-04-01: qty 2

## 2022-04-01 MED ORDER — ASPIRIN 81 MG PO TBEC: 81.0000 mg | DELAYED_RELEASE_TABLET | Freq: Two times a day (BID) | ORAL | 0 refills | Status: DC

## 2022-04-01 MED ORDER — TRANEXAMIC ACID 1000 MG/10ML IV SOLN
INTRAVENOUS | Status: DC | PRN
  Administered 2022-04-01: 2000 mg via TOPICAL

## 2022-04-01 MED ORDER — HYDROCODONE-ACETAMINOPHEN 5-325 MG PO TABS
ORAL_TABLET | ORAL | Status: AC
  Filled 2022-04-01: qty 1

## 2022-04-01 MED ORDER — LACTATED RINGERS IV BOLUS: 500.0000 mL | Freq: Once | INTRAVENOUS | Status: DC

## 2022-04-01 MED ORDER — CEFAZOLIN SODIUM-DEXTROSE 2-4 GM/100ML-% IV SOLN
2.0000 g | INTRAVENOUS | Status: AC
  Administered 2022-04-01: 2 g via INTRAVENOUS
  Filled 2022-04-01: qty 100

## 2022-04-01 MED ORDER — TRIAMTERENE-HCTZ 37.5-25 MG PO TABS: 1.0000 | ORAL_TABLET | Freq: Every day | ORAL | Status: DC | PRN

## 2022-04-01 MED ORDER — TRANEXAMIC ACID-NACL 1000-0.7 MG/100ML-% IV SOLN
1000.0000 mg | Freq: Once | INTRAVENOUS | Status: AC
  Administered 2022-04-01: 1000 mg via INTRAVENOUS

## 2022-04-01 MED ORDER — AMITRIPTYLINE HCL 10 MG PO TABS
10.0000 mg | ORAL_TABLET | Freq: Two times a day (BID) | ORAL | Status: DC
  Administered 2022-04-01 – 2022-04-05 (×8): 10 mg via ORAL
  Filled 2022-04-01 (×9): qty 1

## 2022-04-01 MED ORDER — ONDANSETRON HCL 4 MG/2ML IJ SOLN: 4.0000 mg | Freq: Once | INTRAMUSCULAR | Status: DC | PRN

## 2022-04-01 MED ORDER — PROPOFOL 10 MG/ML IV BOLUS
INTRAVENOUS | Status: DC | PRN
  Administered 2022-04-01: 20 mg via INTRAVENOUS
  Administered 2022-04-01: 50 mg via INTRAVENOUS

## 2022-04-01 MED ORDER — BUPIVACAINE LIPOSOME 1.3 % IJ SUSP: 20.0000 mL | Freq: Once | INTRAMUSCULAR | Status: DC

## 2022-04-01 MED ORDER — POVIDONE-IODINE 10 % EX SWAB
2.0000 "application " | Freq: Once | CUTANEOUS | Status: AC
  Administered 2022-04-01: 2 via TOPICAL

## 2022-04-01 MED ORDER — FAMOTIDINE 20 MG PO TABS
20.0000 mg | ORAL_TABLET | Freq: Every day | ORAL | Status: DC
  Administered 2022-04-02 – 2022-04-05 (×4): 20 mg via ORAL
  Filled 2022-04-01 (×2): qty 1
  Filled 2022-04-01: qty 3
  Filled 2022-04-01: qty 1

## 2022-04-01 MED ORDER — BUPIVACAINE-EPINEPHRINE 0.5% -1:200000 IJ SOLN
INTRAMUSCULAR | Status: DC | PRN
  Administered 2022-04-01: 30 mL

## 2022-04-01 MED ORDER — METOCLOPRAMIDE HCL 5 MG PO TABS: 5.0000 mg | ORAL_TABLET | Freq: Three times a day (TID) | ORAL | Status: DC | PRN

## 2022-04-01 MED ORDER — LACTATED RINGERS IV SOLN: INTRAVENOUS | Status: DC

## 2022-04-01 MED ORDER — FENTANYL CITRATE PF 50 MCG/ML IJ SOSY
PREFILLED_SYRINGE | INTRAMUSCULAR | Status: AC
  Administered 2022-04-01: 50 ug via INTRAVENOUS
  Filled 2022-04-01: qty 2

## 2022-04-01 MED ORDER — CHLORHEXIDINE GLUCONATE 0.12 % MT SOLN
15.0000 mL | Freq: Once | OROMUCOSAL | Status: AC
  Administered 2022-04-01: 15 mL via OROMUCOSAL

## 2022-04-01 MED ORDER — ALUM & MAG HYDROXIDE-SIMETH 200-200-20 MG/5ML PO SUSP: 30.0000 mL | ORAL | Status: DC | PRN

## 2022-04-01 MED ORDER — BISACODYL 5 MG PO TBEC: 5.0000 mg | DELAYED_RELEASE_TABLET | Freq: Every day | ORAL | Status: DC | PRN

## 2022-04-01 MED ORDER — ONDANSETRON HCL 4 MG/2ML IJ SOLN
4.0000 mg | Freq: Four times a day (QID) | INTRAMUSCULAR | Status: DC | PRN
  Administered 2022-04-01: 4 mg via INTRAVENOUS

## 2022-04-01 MED ORDER — PHENYLEPHRINE HCL (PRESSORS) 10 MG/ML IV SOLN
INTRAVENOUS | Status: DC | PRN
  Administered 2022-04-01 (×4): 80 ug via INTRAVENOUS

## 2022-04-01 MED ORDER — METOPROLOL SUCCINATE ER 50 MG PO TB24
50.0000 mg | ORAL_TABLET | Freq: Every day | ORAL | Status: DC
Start: 2022-04-02 — End: 2022-04-05
  Administered 2022-04-04: 50 mg via ORAL
  Filled 2022-04-01 (×4): qty 1

## 2022-04-01 MED ORDER — TRANEXAMIC ACID-NACL 1000-0.7 MG/100ML-% IV SOLN
INTRAVENOUS | Status: AC
  Filled 2022-04-01: qty 100

## 2022-04-01 MED ORDER — TRANEXAMIC ACID 1000 MG/10ML IV SOLN
2000.0000 mg | INTRAVENOUS | Status: DC
  Filled 2022-04-01: qty 20

## 2022-04-01 MED ORDER — HYDROCODONE-ACETAMINOPHEN 5-325 MG PO TABS
1.0000 | ORAL_TABLET | ORAL | Status: DC | PRN
  Administered 2022-04-01 – 2022-04-02 (×3): 1 via ORAL
  Administered 2022-04-02: 2 via ORAL
  Administered 2022-04-02: 1 via ORAL
  Administered 2022-04-02: 2 via ORAL
  Administered 2022-04-03 (×2): 1 via ORAL
  Filled 2022-04-01: qty 2
  Filled 2022-04-01 (×4): qty 1
  Filled 2022-04-01: qty 2

## 2022-04-01 MED ORDER — ASPIRIN 81 MG PO CHEW
81.0000 mg | CHEWABLE_TABLET | Freq: Two times a day (BID) | ORAL | Status: DC
  Administered 2022-04-01 – 2022-04-05 (×8): 81 mg via ORAL
  Filled 2022-04-01 (×8): qty 1

## 2022-04-01 MED ORDER — ONDANSETRON HCL 4 MG PO TABS
4.0000 mg | ORAL_TABLET | Freq: Four times a day (QID) | ORAL | Status: DC | PRN
  Administered 2022-04-03: 4 mg via ORAL
  Filled 2022-04-01: qty 1

## 2022-04-01 MED ORDER — FENTANYL CITRATE PF 50 MCG/ML IJ SOSY: 50.0000 ug | PREFILLED_SYRINGE | Freq: Once | INTRAMUSCULAR | Status: AC

## 2022-04-01 MED ORDER — 0.9 % SODIUM CHLORIDE (POUR BTL) OPTIME
TOPICAL | Status: DC | PRN
  Administered 2022-04-01: 1000 mL

## 2022-04-01 MED ORDER — HYDROCODONE-ACETAMINOPHEN 5-325 MG PO TABS
1.0000 | ORAL_TABLET | Freq: Once | ORAL | Status: DC
  Filled 2022-04-01: qty 1

## 2022-04-01 MED ORDER — ACETAMINOPHEN 325 MG PO TABS
325.0000 mg | ORAL_TABLET | Freq: Four times a day (QID) | ORAL | Status: DC | PRN
  Administered 2022-04-02 – 2022-04-05 (×5): 650 mg via ORAL
  Filled 2022-04-01 (×6): qty 2

## 2022-04-01 MED ORDER — DICYCLOMINE HCL 10 MG PO CAPS
10.0000 mg | ORAL_CAPSULE | Freq: Two times a day (BID) | ORAL | Status: DC
Start: 2022-04-01 — End: 2022-04-05
  Administered 2022-04-01 – 2022-04-05 (×8): 10 mg via ORAL
  Filled 2022-04-01 (×9): qty 1

## 2022-04-01 MED ORDER — PROPOFOL 500 MG/50ML IV EMUL
INTRAVENOUS | Status: DC | PRN
  Administered 2022-04-01: 50 ug/kg/min via INTRAVENOUS

## 2022-04-01 MED ORDER — HYDROMORPHONE HCL 1 MG/ML IJ SOLN
INTRAMUSCULAR | Status: AC
  Filled 2022-04-01: qty 1

## 2022-04-01 SURGICAL SUPPLY — 53 items
ATTUNE PSFEM RTSZ5 NARCEM KNEE (Femur) ×1 IMPLANT
ATTUNE PSRP INSR SZ5 6 KNEE (Insert) ×1 IMPLANT
BAG COUNTER SPONGE SURGICOUNT (BAG) ×2 IMPLANT
BAG DECANTER FOR FLEXI CONT (MISCELLANEOUS) ×2 IMPLANT
BAG ZIPLOCK 12X15 (MISCELLANEOUS) ×2 IMPLANT
BASE TIBIAL ROT PLAT SZ 5 KNEE (Knees) IMPLANT
BLADE SAGITTAL 25.0X1.19X90 (BLADE) ×2 IMPLANT
BLADE SAW SGTL 11.0X1.19X90.0M (BLADE) ×2 IMPLANT
BLADE SURG SZ10 CARB STEEL (BLADE) ×2 IMPLANT
BNDG ELASTIC 6X5.8 VLCR STR LF (GAUZE/BANDAGES/DRESSINGS) ×2 IMPLANT
BOOTIES KNEE HIGH SLOAN (MISCELLANEOUS) ×2 IMPLANT
BOWL SMART MIX CTS (DISPOSABLE) ×2 IMPLANT
CEMENT HV SMART SET (Cement) ×4 IMPLANT
COVER SURGICAL LIGHT HANDLE (MISCELLANEOUS) ×2 IMPLANT
CUFF TOURN SGL QUICK 34 (TOURNIQUET CUFF) ×1
CUFF TRNQT CYL 34X4.125X (TOURNIQUET CUFF) ×1 IMPLANT
DRAPE INCISE IOBAN 66X45 STRL (DRAPES) ×2 IMPLANT
DRAPE ORTHO 2.5IN SPLIT 77X108 (DRAPES) ×2 IMPLANT
DRAPE ORTHO SPLIT 77X108 STRL (DRAPES) ×2
DRAPE SHEET LG 3/4 BI-LAMINATE (DRAPES) ×2 IMPLANT
DRAPE U-SHAPE 47X51 STRL (DRAPES) ×2 IMPLANT
DRSG AQUACEL AG ADV 3.5X10 (GAUZE/BANDAGES/DRESSINGS) ×2 IMPLANT
DURAPREP 26ML APPLICATOR (WOUND CARE) ×4 IMPLANT
ELECT REM PT RETURN 15FT ADLT (MISCELLANEOUS) ×2 IMPLANT
GLOVE BIO SURGEON STRL SZ8 (GLOVE) ×4 IMPLANT
GLOVE BIOGEL PI IND STRL 8 (GLOVE) ×2 IMPLANT
GLOVE BIOGEL PI INDICATOR 8 (GLOVE) ×2
GOWN STRL REUS W/ TWL XL LVL3 (GOWN DISPOSABLE) ×2 IMPLANT
GOWN STRL REUS W/TWL XL LVL3 (GOWN DISPOSABLE) ×2
HANDPIECE INTERPULSE COAX TIP (DISPOSABLE) ×1
HOLDER FOLEY CATH W/STRAP (MISCELLANEOUS) IMPLANT
HOOD PEEL AWAY FLYTE STAYCOOL (MISCELLANEOUS) ×6 IMPLANT
KIT TURNOVER KIT A (KITS) IMPLANT
MANIFOLD NEPTUNE II (INSTRUMENTS) ×2 IMPLANT
NEEDLE HYPO 22GX1.5 SAFETY (NEEDLE) ×2 IMPLANT
NS IRRIG 1000ML POUR BTL (IV SOLUTION) ×2 IMPLANT
PACK TOTAL KNEE CUSTOM (KITS) ×2 IMPLANT
PAD ARMBOARD 7.5X6 YLW CONV (MISCELLANEOUS) ×2 IMPLANT
PATELLA MEDIAL ATTUN 35MM KNEE (Knees) ×1 IMPLANT
PROTECTOR NERVE ULNAR (MISCELLANEOUS) ×2 IMPLANT
SET HNDPC FAN SPRY TIP SCT (DISPOSABLE) ×1 IMPLANT
SPIKE FLUID TRANSFER (MISCELLANEOUS) ×4 IMPLANT
SUT ETHIBOND NAB CT1 #1 30IN (SUTURE) ×4 IMPLANT
SUT VIC AB 0 CT1 36 (SUTURE) ×2 IMPLANT
SUT VIC AB 2-0 CT1 27 (SUTURE) ×1
SUT VIC AB 2-0 CT1 TAPERPNT 27 (SUTURE) ×1 IMPLANT
SUT VICRYL AB 3-0 FS1 BRD 27IN (SUTURE) ×2 IMPLANT
SUT VLOC 180 0 24IN GS25 (SUTURE) ×2 IMPLANT
TIBIAL BASE ROT PLAT SZ 5 KNEE (Knees) ×2 IMPLANT
TRAY FOLEY MTR SLVR 16FR STAT (SET/KITS/TRAYS/PACK) ×1 IMPLANT
WATER STERILE IRR 1000ML POUR (IV SOLUTION) ×2 IMPLANT
WRAP KNEE MAXI GEL POST OP (GAUZE/BANDAGES/DRESSINGS) ×2 IMPLANT
YANKAUER SUCT BULB TIP NO VENT (SUCTIONS) ×2 IMPLANT

## 2022-04-01 NOTE — Progress Notes (Signed)
Orthopedic Tech Progress Note Patient Details:  Jennifer Melendez 1941/05/21 [id number]  Patient ID: [name], female   DOB: 12-24-1940, 81 y.o.   MRN: [id number]  [name] 04/01/2022, 7:20 PM Right knee immobilizer applied

## 2022-04-01 NOTE — Progress Notes (Signed)
Pt has arrived to 1323 from PACU s/p right TKA.  Pt is alert and oriented.  Daughter at bedside.  Room orientation completed with call bell placed at bedside.  Will continue to monitor pt.

## 2022-04-01 NOTE — Evaluation (Signed)
Physical Therapy Evaluation Patient Details Name: Jennifer Melendez MRN: [id number] DOB: 1941/07/04 Today's Date: 04/01/2022  History of Present Illness  Pt is an 81yo female presenting s/p R-TKA on 04/01/22. PMH: hx of cervical caner, fibromyalgia, HLD, HTN, PVC, s/p brain surgery meningioma, back surgery L5.  Clinical Impression  [name] is a 81 y.o. female POD 0 s/p R-TKA evaluated in PACU for possible same-day discharge. Patient reports modified independence with mobility at baseline. Patient is now limited by functional impairments (see PT problem list below) and requires min assist for bed mobility and min guard for transfers. Upon standing, pt unable to weightbear on RLE without increase in pain and symptoms of lightheadedness, VSS. Pt returned to supine position and RN notified. Patient instructed in exercises to facilitate ROM and circulation. Patient will benefit from continued skilled PT interventions to address impairments and progress towards PLOF.  Acute PT will follow to progress mobility and stair training in preparation for safe discharge home.        Recommendations for follow up therapy are one component of a multi-disciplinary discharge planning process, led by the attending physician.  Recommendations may be updated based on patient status, additional functional criteria and insurance authorization.  Follow Up Recommendations Follow physician's recommendations for discharge plan and follow up therapies    Assistance Recommended at Discharge Intermittent Supervision/Assistance  Patient can return home with the following  A little help with walking and/or transfers;A little help with bathing/dressing/bathroom;Assistance with cooking/housework;Assist for transportation;Help with stairs or ramp for entrance    Equipment Recommendations Rolling walker (2 wheels)  Recommendations for Other Services       Functional Status Assessment Patient has had a recent decline in  their functional status and demonstrates the ability to make significant improvements in function in a reasonable and predictable amount of time.     Precautions / Restrictions Precautions Precautions: Fall Restrictions Weight Bearing Restrictions: No Other Position/Activity Restrictions: WBAT      Mobility  Bed Mobility Overal bed mobility: Needs Assistance Bed Mobility: Supine to Sit     Supine to sit: Min assist     General bed mobility comments: Min assist to bring RLE off bed.    Transfers Overall transfer level: Needs assistance Equipment used: Rolling walker (2 wheels) Transfers: Sit to/from Stand Sit to Stand: Min guard, From elevated surface           General transfer comment: Min guard for safety only, no physical assist required. Pt came to standing but was unable to bear weight on RLE without increase in pain, PT provided min assist to RLE to prevent buckling but pt still unable / refusing to bear weight secondary to pain. returned to supine positioning. RN notified.    Ambulation/Gait               General Gait Details: deferred  Stairs            Wheelchair Mobility    Modified Rankin (Stroke Patients Only)       Balance Overall balance assessment: Needs assistance Sitting-balance support: Feet unsupported, Bilateral upper extremity supported Sitting balance-Leahy Scale: Fair     Standing balance support: Reliant on assistive device for balance, During functional activity, Bilateral upper extremity supported Standing balance-Leahy Scale: Poor                               Pertinent Vitals/Pain Pain Assessment Pain Assessment: 0-10 Pain  Score: 5  Pain Location: right knoe Pain Descriptors / Indicators: Operative site guarding, Discomfort Pain Intervention(s): Limited activity within patient's tolerance, Monitored during session, Repositioned, Ice applied    Home Living Family/patient expects to be discharged to::  Private residence Living Arrangements: Alone Available Help at Discharge: Family;Available 24 hours/day Type of Home: House Home Access: Stairs to enter Entrance Stairs-Rails: Left Entrance Stairs-Number of Steps: 6   Home Layout: One level Home Equipment: Toilet riser;Shower seat;Cane - single point Additional Comments: Will be recovering at her daughter [employer]    Prior Function Prior Level of Function : Independent/Modified Independent             Mobility Comments: SPC occasionally ADLs Comments: ind     Hand Dominance        Extremity/Trunk Assessment   Upper Extremity Assessment Upper Extremity Assessment: Overall WFL for tasks assessed    Lower Extremity Assessment Lower Extremity Assessment: RLE deficits/detail;LLE deficits/detail RLE Deficits / Details: MMT ank DF/PF 4/5, reduced ankle DF rom to neutral. Pt unable to perform SLR without assistance. RLE Sensation: WNL LLE Deficits / Details: MMT ank DF/PF 4/5, reduced ankle DF rom to neutral. LLE Sensation: WNL    Cervical / Trunk Assessment Cervical / Trunk Assessment: Back Surgery  Communication   Communication: No difficulties  Cognition Arousal/Alertness: Awake/alert Behavior During Therapy: WFL for tasks assessed/performed Overall Cognitive Status: Within Functional Limits for tasks assessed                                          General Comments General comments (skin integrity, edema, etc.): Daughter Janace Hoard present for eval    Exercises Total Joint Exercises Ankle Circles/Pumps: AROM, Both, 10 reps   Assessment/Plan    PT Assessment Patient needs continued PT services  PT Problem List Decreased strength;Decreased range of motion;Decreased activity tolerance;Decreased balance;Decreased mobility;Decreased coordination;Decreased knowledge of use of DME;Pain       PT Treatment Interventions DME instruction;Gait training;Stair training;Functional mobility  training;Therapeutic activities;Therapeutic exercise;Balance training;Neuromuscular re-education;Patient/family education    PT Goals (Current goals can be found in the Care Plan section)  Acute Rehab PT Goals Patient Stated Goal: Walk while shopping PT Goal Formulation: With patient Time For Goal Achievement: 04/08/22 Potential to Achieve Goals: Good    Frequency 7X/week     Co-evaluation               AM-PAC PT "6 Clicks" Mobility  Outcome Measure Help needed turning from your back to your side while in a flat bed without using bedrails?: A Little Help needed moving from lying on your back to sitting on the side of a flat bed without using bedrails?: A Little Help needed moving to and from a bed to a chair (including a wheelchair)?: A Little Help needed standing up from a chair using your arms (e.g., wheelchair or bedside chair)?: A Little Help needed to walk in hospital room?: A Little Help needed climbing 3-5 steps with a railing? : A Little 6 Click Score: 18    End of Session Equipment Utilized During Treatment: Gait belt Activity Tolerance: Patient limited by pain Patient left: in bed;with call bell/phone within reach;with family/visitor present Nurse Communication: Mobility status PT Visit Diagnosis: Difficulty in walking, not elsewhere classified (R26.2);Pain Pain - Right/Left: Right Pain - part of body: Knee    Time: 4403-4742 PT Time Calculation (min) (ACUTE ONLY): 26  min   Charges:   PT Evaluation $PT Eval Low Complexity: 1 Low          Coolidge Breeze, [place], DPT [facility] Rehabilitation Department Office: [phone] Pager: [phone]  Coolidge Breeze 04/01/2022, 4:21 PM

## 2022-04-01 NOTE — Transfer of Care (Signed)
Immediate Anesthesia Transfer of Care Note  Patient: Jennifer Melendez  Procedure(s) Performed: RIGHT TOTAL KNEE ARTHROPLASTY (Right: Knee)  Patient Location: PACU  Anesthesia Type:Spinal and MAC combined with regional for post-op pain  Level of Consciousness: awake, oriented, drowsy and patient cooperative  Airway & Oxygen Therapy: Patient Spontanous Breathing and Patient connected to face mask oxygen  Post-op Assessment: Report given to RN and Post -op Vital signs reviewed and stable  Post vital signs: stable  Last Vitals:  Vitals Value Taken Time  BP 119/104 04/01/22 1216  Temp    Pulse 49 04/01/22 1215  Resp 17 04/01/22 1218  SpO2 81 % 04/01/22 1215  Vitals shown include unvalidated device data.  Last Pain:  Vitals:   04/01/22 0830  TempSrc: Oral  PainSc:          Complications: No notable events documented.

## 2022-04-01 NOTE — Anesthesia Postprocedure Evaluation (Signed)
Anesthesia Post Note  Patient: Jennifer Melendez  Procedure(s) Performed: RIGHT TOTAL KNEE ARTHROPLASTY (Right: Knee)     Patient location during evaluation: Nursing Unit Anesthesia Type: Regional and Spinal Level of consciousness: oriented and awake and alert Pain management: pain level controlled Vital Signs Assessment: post-procedure vital signs reviewed and stable Respiratory status: spontaneous breathing and respiratory function stable Cardiovascular status: blood pressure returned to baseline and stable Postop Assessment: no headache, no backache, no apparent nausea or vomiting and patient able to bend at knees Anesthetic complications: no   No notable events documented.  Last Vitals:  Vitals:   04/01/22 1330 04/01/22 1415  BP: (!) 126/58 (!) 141/96  Pulse: 67 73  Resp: (!) 25   Temp: (!) 36.4 C   SpO2: 93% 97%    Last Pain:  Vitals:   04/01/22 1415  TempSrc:   PainSc: 3                  Barnet Glasgow

## 2022-04-01 NOTE — Op Note (Signed)
PREOP DIAGNOSIS: DJD RIGHT KNEE POSTOP DIAGNOSIS: same PROCEDURE: RIGHT TKR ANESTHESIA: Spinal and MAC ATTENDING SURGEON: Hessie Dibble ASSISTANT: Loni Dolly PA  INDICATIONS FOR PROCEDURE: Jennifer Melendez is a 81 y.o. female who has struggled for a long time with pain due to degenerative arthritis of the right knee.  The patient has failed many conservative non-operative measures and at this point has pain which limits the ability to sleep and walk.  The patient is offered total knee replacement.  Informed operative consent was obtained after discussion of possible risks of anesthesia, infection, neurovascular injury, DVT, and death.  The importance of the post-operative rehabilitation protocol to optimize result was stressed extensively with the patient.  SUMMARY OF FINDINGS AND PROCEDURE:  MKENZIE DOTTS was taken to the operative suite where under the above anesthesia a right knee replacement was performed.  There were advanced degenerative changes and the bone quality was poor.  We used the DePuy Attune system and placed size 5 narrow femur, 5 tibia, 35 mm all polyethylene patella, and a size 6 mm spacer.  Loni Dolly PA-C assisted throughout and was invaluable to the completion of the case in that he helped retract and maintain exposure while I placed components.  He also helped close thereby minimizing OR time.  The patient was admitted for appropriate post-op care to include perioperative antibiotics and mechanical and pharmacologic measures for DVT prophylaxis.  DESCRIPTION OF PROCEDURE:  [name] was taken to the operative suite where the above anesthesia was applied.  The patient was positioned supine and prepped and draped in normal sterile fashion.  An appropriate time out was performed.  After the administration of kefzol pre-op antibiotic the leg was elevated and exsanguinated and a tourniquet inflated. A standard longitudinal incision was made on the anterior knee.   Dissection was carried down to the extensor mechanism.  All appropriate anti-infective measures were used including the pre-operative antibiotic, betadine impregnated drape, and closed hooded exhaust systems for each member of the surgical team.  A medial parapatellar incision was made in the extensor mechanism and the knee cap flipped and the knee flexed.  Some residual meniscal tissues were removed along with any remaining ACL/PCL tissue.  A guide was placed on the tibia and a flat cut was made on it's superior surface.  An intramedullary guide was placed in the femur and was utilized to make anterior and posterior cuts creating an appropriate flexion gap.  A second intramedullary guide was placed in the femur to make a distal cut properly balancing the knee with an extension gap equal to the flexion gap.  The three bones sized to the above mentioned sizes and the appropriate guides were placed and utilized.  A trial reduction was done and the knee easily came to full extension and the patella tracked well on flexion.  The trial components were removed and all bones were cleaned with pulsatile lavage and then dried thoroughly.  Cement was mixed and was pressurized onto the bones followed by placement of the aforementioned components.  Excess cement was trimmed and pressure was held on the components until the cement had hardened.  The tourniquet was deflated and a small amount of bleeding was controlled with cautery and pressure.  The knee was irrigated thoroughly.  The extensor mechanism was re-approximated with #1 ethibond in interrupted fashion.  The knee was flexed and the repair was solid.  The subcutaneous tissues were re-approximated with #0 and #2-0 vicryl and the skin closed  with a subcuticular stitch and steristrips.  A sterile dressing was applied.  Intraoperative fluids, EBL, and tourniquet time can be obtained from anesthesia records.  DISPOSITION:  The patient was taken to recovery room in stable  condition and scheduled to potentially go home same day depending on ability to walk and tolerate liquids.Monico Blitz Bland Rudzinski 04/01/2022, 11:41 AM

## 2022-04-01 NOTE — Progress Notes (Signed)
Assisted Dr. Valma Cava with right, adductor canal, ultrasound guided Stefhanie Kachmar. Side rails up, monitors on throughout procedure. See vital signs in flow sheet. Tolerated Procedure well.

## 2022-04-01 NOTE — Interval H&P Note (Signed)
History and Physical Interval Note:  04/01/2022 9:20 AM  Jennifer Melendez  has presented today for surgery, with the diagnosis of RIGHT KNEE DEGENERATIVE JOINT DISEASE.  The various methods of treatment have been discussed with the patient and family. After consideration of risks, benefits and other options for treatment, the patient has consented to  Procedure(s): RIGHT TOTAL KNEE ARTHROPLASTY (Right) as a surgical intervention.  The patient's history has been reviewed, patient examined, no change in status, stable for surgery.  I have reviewed the patient's chart and labs.  Questions were answered to the patient's satisfaction.     Hessie Dibble

## 2022-04-01 NOTE — Anesthesia Procedure Notes (Signed)
Spinal  Patient location during procedure: OB Start time: 04/01/2022 10:02 AM End time: 04/01/2022 10:07 AM Reason for block: surgical anesthesia Staffing Performed: anesthesiologist  Anesthesiologist: Barnet Glasgow, MD Preanesthetic Checklist Completed: patient identified, IV checked, risks and benefits discussed, surgical consent, monitors and equipment checked, pre-op evaluation and timeout performed Spinal Block Patient position: sitting Prep: DuraPrep and site prepped and draped Patient monitoring: heart rate, cardiac monitor, continuous pulse ox and blood pressure Approach: midline Location: L3-4 Injection technique: single-shot Needle Needle type: Pencan  Needle gauge: 24 G Needle length: 10 cm Needle insertion depth: 6 cm Assessment Sensory level: T4 Events: CSF return Additional Notes 1 Attempt (s). Pt tolerated procedure well.

## 2022-04-01 NOTE — Anesthesia Procedure Notes (Signed)
Anesthesia Regional Block: Adductor canal block   Pre-Anesthetic Checklist: , timeout performed,  Correct Patient, Correct Site, Correct Laterality,  Correct Procedure, Correct Position, site marked,  Risks and benefits discussed,  Surgical consent,  Pre-op evaluation,  At surgeon's request and post-op pain management  Laterality: Lower and Right  Prep: chloraprep       Needles:  Injection technique: Single-shot  Needle Type: Echogenic Needle     Needle Length: 9cm  Needle Gauge: 22     Additional Needles:   Procedures:,,,, ultrasound used (permanent image in chart),,    Narrative:  Start time: 04/01/2022 8:40 AM End time: 04/01/2022 8:46 AM Injection made incrementally with aspirations every 5 mL.  Performed by: Personally  Anesthesiologist: Barnet Glasgow, MD  Additional Notes: Block assessed prior to surgery. Pt tolerated procedure well.

## 2022-04-02 NOTE — Plan of Care (Signed)

## 2022-04-02 NOTE — Progress Notes (Signed)
Physical Therapy Treatment Patient Details Name: Jennifer Melendez MRN: [id number] DOB: [birth year] Today's Date: 04/02/2022   History of Present Illness Pt is an 81yo female presenting s/p R-TKA on 04/01/22. PMH: hx of cervical caner, fibromyalgia, HLD, HTN, PVC, s/p brain surgery meningioma, back surgery L5.    PT Comments    Pt progressing however very dizzy with amb. See below for details. RN aware. Will see gain this pm, unsure if she will be ready for d/c today   Recommendations for follow up therapy are one component of a multi-disciplinary discharge planning process, led by the attending physician.  Recommendations may be updated based on patient status, additional functional criteria and insurance authorization.  Follow Up Recommendations  Follow physician's recommendations for discharge plan and follow up therapies     Assistance Recommended at Discharge Intermittent Supervision/Assistance  Patient can return home with the following A little help with walking and/or transfers;A little help with bathing/dressing/bathroom;Assistance with cooking/housework;Assist for transportation;Help with stairs or ramp for entrance   Equipment Recommendations  Rolling walker (2 wheels)    Recommendations for Other Services       Precautions / Restrictions Precautions Precautions: Fall;Knee Restrictions Weight Bearing Restrictions: No RLE Weight Bearing: Weight bearing as tolerated     Mobility  Bed Mobility Overal bed mobility: Needs Assistance Bed Mobility: Supine to Sit     Supine to sit: Min assist     General bed mobility comments: Min assist to bring RLE off bed.    Transfers Overall transfer level: Needs assistance Equipment used: Rolling walker (2 wheels) Transfers: Sit to/from Stand Sit to Stand: Min assist, Mod assist, From elevated surface           General transfer comment: multi-modal cues for hand placement, LE position, to power up with LLE     Ambulation/Gait Ambulation/Gait assistance: Min assist Gait Distance (Feet): 15 Feet (x2) Assistive device: Rolling walker (2 wheels) Gait Pattern/deviations: Step-to pattern Gait velocity: decr     General Gait Details: multi-modal cues for sequence, RW psoition. dizzy with amb, BP 139/45, SpO2=97%   Stairs             Wheelchair Mobility    Modified Rankin (Stroke Patients Only)       Balance   Sitting-balance support: Feet unsupported, Bilateral upper extremity supported Sitting balance-Leahy Scale: Fair     Standing balance support: Reliant on assistive device for balance, During functional activity, Bilateral upper extremity supported Standing balance-Leahy Scale: Poor                              Cognition Arousal/Alertness: Awake/alert Behavior During Therapy: WFL for tasks assessed/performed Overall Cognitive Status: Within Functional Limits for tasks assessed                                          Exercises Total Joint Exercises Ankle Circles/Pumps: AROM, Both, 10 reps Quad Sets: AROM, Both, 10 reps    General Comments        Pertinent Vitals/Pain Pain Assessment Pain Assessment: 0-10 Pain Score: 6  Pain Location: right knee Pain Descriptors / Indicators: Operative site guarding, Discomfort Pain Intervention(s): Limited activity within patient's tolerance, Monitored during session, Premedicated before session, Repositioned, Ice applied    Home Living  Prior Function            PT Goals (current goals can now be found in the care plan section) Acute Rehab PT Goals Patient Stated Goal: Walk while shopping PT Goal Formulation: With patient Time For Goal Achievement: 04/08/22 Potential to Achieve Goals: Good Progress towards PT goals: Progressing toward goals    Frequency    7X/week      PT Plan Current plan remains appropriate    Co-evaluation               AM-PAC PT "6 Clicks" Mobility   Outcome Measure  Help needed turning from your back to your side while in a flat bed without using bedrails?: A Little Help needed moving from lying on your back to sitting on the side of a flat bed without using bedrails?: A Little Help needed moving to and from a bed to a chair (including a wheelchair)?: A Little Help needed standing up from a chair using your arms (e.g., wheelchair or bedside chair)?: A Little Help needed to walk in hospital room?: A Little Help needed climbing 3-5 steps with a railing? : A Little 6 Click Score: 18    End of Session Equipment Utilized During Treatment: Gait belt;Right knee immobilizer Activity Tolerance: Patient limited by fatigue Patient left: in chair;with call bell/phone within reach;with chair alarm set Nurse Communication: Mobility status PT Visit Diagnosis: Difficulty in walking, not elsewhere classified (R26.2);Pain Pain - Right/Left: Right Pain - part of body: Knee     Time: 2956-2130 PT Time Calculation (min) (ACUTE ONLY): 33 min  Charges:  $Gait Training: 23-37 mins                     Baxter Flattery, PT  Acute Rehab Dept ([place]) [phone] Pager [phone]  04/02/2022    [facility] 04/02/2022, 11:31 AM

## 2022-04-02 NOTE — Progress Notes (Signed)
Subjective: 1 Day Post-Op Procedure(s) (LRB): RIGHT TOTAL KNEE ARTHROPLASTY (Right)  Patient feels much better this morning and is hoping to go home today.  Activity level:  wbat Diet tolerance:  ok Voiding:  ok Patient reports pain as mild.    Objective: Vital signs in last 24 hours: Temp:  [96.8 F (36 C)-99.5 F (37.5 C)] 97.7 F (36.5 C) (06/07 0530) Pulse Rate:  [46-102] 68 (06/07 0530) Resp:  [10-25] 18 (06/07 0530) BP: (106-141)/(44-104) 123/55 (06/07 0530) SpO2:  [90 %-100 %] 99 % (06/07 0530) Weight:  [82.1 kg] 82.1 kg (06/06 1841)  Labs: No results for input(s): HGB in the last 72 hours. No results for input(s): WBC, RBC, HCT, PLT in the last 72 hours. No results for input(s): NA, K, CL, CO2, BUN, CREATININE, GLUCOSE, CALCIUM in the last 72 hours. No results for input(s): LABPT, INR in the last 72 hours.  Physical Exam:  Neurologically intact ABD soft Neurovascular intact Sensation intact distally Intact pulses distally Dorsiflexion/Plantar flexion intact Incision: dressing C/D/I and no drainage No cellulitis present Compartment soft  Assessment/Plan:  1 Day Post-Op Procedure(s) (LRB): RIGHT TOTAL KNEE ARTHROPLASTY (Right) Advance diet Up with therapy D/C IV fluids Discharge home with home health today if cleared by PT and doing well. Follow up in office 2 weeks post op. 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    Larwance Sachs Pegge Cumberledge 04/02/2022, 8:16 AM

## 2022-04-02 NOTE — Plan of Care (Signed)
  Problem: Activity: Goal: Ability to avoid complications of mobility impairment will improve Outcome: Progressing Goal: Range of joint motion will improve Outcome: Progressing   Problem: Pain Management: Goal: Pain level will decrease with appropriate interventions Outcome: Progressing   

## 2022-04-02 NOTE — Progress Notes (Signed)
Physical Therapy Treatment Patient Details Name: Jennifer Melendez MRN: [id number] DOB: Apr 21, 1941 Today's Date: 04/02/2022   History of Present Illness Pt is an 81yo female presenting s/p R-TKA on 04/01/22. PMH: hx of cervical caner, fibromyalgia, HLD, HTN, PVC, s/p brain surgery meningioma, back surgery L5.    PT Comments    Pt progressing slowly, requiring mod assist  with transfers, continues to have pain issues. Pt will need at least another day with PT to safely return home with family assist.  Recommendations for follow up therapy are one component of a multi-disciplinary discharge planning process, led by the attending physician.  Recommendations may be updated based on patient status, additional functional criteria and insurance authorization.  Follow Up Recommendations  Follow physician's recommendations for discharge plan and follow up therapies     Assistance Recommended at Discharge Intermittent Supervision/Assistance  Patient can return home with the following A little help with walking and/or transfers;A little help with bathing/dressing/bathroom;Assistance with cooking/housework;Assist for transportation;Help with stairs or ramp for entrance   Equipment Recommendations  Rolling walker (2 wheels)    Recommendations for Other Services       Precautions / Restrictions Precautions Precautions: Fall;Knee Restrictions Weight Bearing Restrictions: No RLE Weight Bearing: Weight bearing as tolerated     Mobility  Bed Mobility Overal bed mobility: Needs Assistance Bed Mobility: Sit to Supine     Supine to sit: Min assist Sit to supine: Min assist   General bed mobility comments: Min assist to guide RLE on to bed. incr time, cues for sequence. encouraged pt to use leg lifter to self assist    Transfers Overall transfer level: Needs assistance Equipment used: Rolling walker (2 wheels) Transfers: Sit to/from Stand Sit to Stand: Mod assist, Min assist            General transfer comment: multi-modal cues for hand placement, LE position, to power up with LLE; mod assist from recliner, min assist from [facility]    Ambulation/Gait Ambulation/Gait assistance: Min assist Gait Distance (Feet): 15 Feet (x2) Assistive device: Rolling walker (2 wheels) Gait Pattern/deviations: Step-to pattern, Decreased stance time - right Gait velocity: decr     General Gait Details: multi-modal cues for sequence, RW position, less dizziness. pt declined to attempt incr amb d/t pain   Stairs             Wheelchair Mobility    Modified Rankin (Stroke Patients Only)       Balance   Sitting-balance support: Feet unsupported, Bilateral upper extremity supported Sitting balance-Leahy Scale: Fair     Standing balance support: Reliant on assistive device for balance, During functional activity, Bilateral upper extremity supported Standing balance-Leahy Scale: Poor                              Cognition Arousal/Alertness: Awake/alert Behavior During Therapy: WFL for tasks assessed/performed Overall Cognitive Status: Within Functional Limits for tasks assessed                                          Exercises Total Joint Exercises Ankle Circles/Pumps: AROM, Both, 10 reps Quad Sets: AROM, Both, 10 reps Heel Slides: AAROM, Right, 10 reps Straight Leg Raises: AAROM, 10 reps, Right    General Comments        Pertinent Vitals/Pain Pain Assessment Pain Assessment: Faces Pain Score: 6  Faces Pain Scale: Hurts whole lot Pain Location: right knee Pain Descriptors / Indicators: Operative site guarding, Discomfort, Constant Pain Intervention(s): Limited activity within patient's tolerance, Monitored during session, Premedicated before session, Repositioned    Home Living                          Prior Function            PT Goals (current goals can now be found in the care plan section) Acute Rehab PT  Goals Patient Stated Goal: Walk while shopping PT Goal Formulation: With patient Time For Goal Achievement: 04/08/22 Potential to Achieve Goals: Good Progress towards PT goals: Progressing toward goals    Frequency    7X/week      PT Plan Current plan remains appropriate    Co-evaluation              AM-PAC PT "6 Clicks" Mobility   Outcome Measure  Help needed turning from your back to your side while in a flat bed without using bedrails?: A Little Help needed moving from lying on your back to sitting on the side of a flat bed without using bedrails?: A Little Help needed moving to and from a bed to a chair (including a wheelchair)?: A Little Help needed standing up from a chair using your arms (e.g., wheelchair or bedside chair)?: A Little Help needed to walk in hospital room?: A Little Help needed climbing 3-5 steps with a railing? : A Little 6 Click Score: 18    End of Session Equipment Utilized During Treatment: Gait belt;Right knee immobilizer Activity Tolerance: Patient limited by fatigue;Patient limited by pain Patient left: with call bell/phone within reach;in bed;with bed alarm set Nurse Communication: Mobility status PT Visit Diagnosis: Difficulty in walking, not elsewhere classified (R26.2);Pain Pain - Right/Left: Right Pain - part of body: Knee     Time: 6045-4098 PT Time Calculation (min) (ACUTE ONLY): 28 min  Charges:  $Gait Training: 8-22 mins $Therapeutic Exercise: 8-22 mins                     Baxter Flattery, PT  Acute Rehab Dept ([place]) [phone] Pager [phone]  04/02/2022    [facility] 04/02/2022, 2:45 PM

## 2022-04-02 NOTE — Discharge Summary (Deleted)
Patient ID: Jennifer Melendez MRN: [id number] DOB/AGE: January 27, 1941 81 y.o.  Admit date: 04/01/2022 Discharge date: 04/02/2022  Admission Diagnoses:  Principal Problem:   Primary osteoarthritis of right knee   Discharge Diagnoses:  Same  Past Medical History:  Diagnosis Date   Acute sphenoidal sinusitis 06/28/2014   Last Assessment & Plan:  Formatting of this note might be different from the original. Pt will continue nasal steroid. Z pack sent to pharmacy.   Anemia    Axillary fullness 04/06/2017   Barrett's esophagus with esophagitis 07/04/2015   Cancer ([place])    1979 cervical   Chronic fatigue 06/28/2014   Last Assessment & Plan:  Formatting of this note might be different from the original. Psychological condition is worsening. May need to do sleep study to r/o OSA check labs today Psychological condition  will be reassessed in 3 months.   Constipation    Fibromyalgia    Gastritis 10/07/2016   Headache    Hx of bilateral breast implants 04/06/2017   Hyperlipemia 05/26/2014   Last Assessment & Plan:  Formatting of this note might be different from the original. Lipid abnormalities are unchanged. Pt off cholesterol medicine Nutritional counseling was provided. and Pharmacotherapy as ordered. Lipids will be reassessed in 6 months. Formatting of this note might be different from the original. Last Assessment & Plan:  Lipid abnormalities are unchanged. Pt off cholesterol m   Hyperlipidemia    Hypertension 05/26/2014   Last Assessment & Plan:  Formatting of this note might be different from the original. Hypertension is improving with treatment. Continue current treatment regimen. Dietary sodium restriction. Regular aerobic exercise. Blood pressure will be reassessed in 4 months. Formatting of this note might be different from the original. Last Assessment & Plan:  Hypertension is improving with treatment. Conti   Meningioma [facility]) 05/26/2014   Last Assessment & Plan:  Formatting of this  note might be different from the original. Pt needs f/u brain MRI   Otalgia of left ear 04/16/2016   Other chest pain 06/10/2019   PAC (premature atrial contraction) 07/04/2020   Palpitations    PAT (paroxysmal atrial tachycardia) ([place]) 07/04/2020   Preventative health care 08/14/2015   PVC (premature ventricular contraction)    PVC's (premature ventricular contractions) 06/10/2019   Seasonal allergies 05/26/2014   Last Assessment & Plan:  Formatting of this note might be different from the original. Pt taking OTC allergy med Formatting of this note might be different from the original. 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 female who was admitted 04/01/2022 for operative treatment ofPrimary osteoarthritis of right knee. Patient has severe unremitting pain that affects sleep, daily activities, and work/hobbies. After pre-op clearance the patient was taken to the operating room on 04/01/2022 and underwent  Procedure(s): RIGHT TOTAL KNEE ARTHROPLASTY.    Patient was given perioperative antibiotics:  Anti-infectives (From admission, onward)    Start     Dose/Rate Route Frequency Ordered Stop   04/01/22 1652  ceFAZolin (ANCEF) 2-4 GM/100ML-% IVPB       Note to Pharmacy: Dara Lords M: cabinet override      04/01/22 1652 04/02/22 0459   04/01/22 1530  ceFAZolin (ANCEF) IVPB 2g/100 mL premix        2 g 200 mL/hr over  30 Minutes Intravenous Every 6 hours 04/01/22 1336 04/02/22 0004   04/01/22 0800  ceFAZolin (ANCEF) IVPB 2g/100 mL premix        2 g 200 mL/hr over 30 Minutes Intravenous On call to O.R. 04/01/22 0748 04/01/22 1017        Patient was given sequential compression devices, early ambulation, and chemoprophylaxis to prevent DVT.  Patient benefited maximally from  hospital stay and there were no complications.    Recent vital signs: Patient Vitals for the past 24 hrs:  BP Temp Temp src Pulse Resp SpO2 Height Weight  04/02/22 0530 (!) 123/55 97.7 F (36.5 C) -- 68 18 99 % -- --  04/02/22 0131 (!) 120/44 97.7 F (36.5 C) Oral 73 17 100 % -- --  04/02/22 0034 -- -- -- -- -- 94 % -- --  04/02/22 0030 -- -- -- -- -- 90 % -- --  04/01/22 2118 140/68 99.5 F (37.5 C) Oral 89 17 99 % -- --  04/01/22 1841 108/70 98.1 F (36.7 C) Oral 79 16 93 % 5' 7.5" (1.715 m) 82.1 kg  04/01/22 1804 108/70 98.1 F (36.7 C) Oral 79 -- 93 % -- --  04/01/22 1722 (!) 126/47 98.6 F (37 C) -- 74 16 95 % -- --  04/01/22 1645 (!) 106/47 -- -- 72 -- 95 % -- --  04/01/22 1632 (!) 132/46 -- -- 73 18 95 % -- --  04/01/22 1600 (!) 127/50 -- -- (!) 102 18 97 % -- --  04/01/22 1415 (!) 141/96 -- -- 73 -- 97 % -- --  04/01/22 1330 (!) 126/58 (!) 97.5 F (36.4 C) -- 67 (!) 25 93 % -- --  04/01/22 1315 123/62 (!) 96.9 F (36.1 C) -- 69 15 96 % -- --  04/01/22 1300 (!) 135/59 -- -- 65 10 96 % -- --  04/01/22 1230 (!) 114/91 -- -- (!) 46 13 100 % -- --  04/01/22 1215 (!) 119/104 (!) 96.8 F (36 C) -- (!) 49 18 100 % -- --  04/01/22 0850 129/61 -- -- 77 17 100 % -- --  04/01/22 0845 124/65 -- -- 77 (!) 23 100 % -- --  04/01/22 0840 (!) 131/58 -- -- 89 14 95 % -- --  04/01/22 0830 (!) 123/95 97.8 F (36.6 C) Oral 87 16 97 % -- --     Recent laboratory studies: No results for input(s): WBC, HGB, HCT, PLT, NA, K, CL, CO2, BUN, CREATININE, GLUCOSE, INR, CALCIUM in the last 72 hours.  Invalid input(s): PT, 2   Discharge Medications:   Allergies as of 04/02/2022       Reactions   Amlodipine Besylate Other (See Comments)   Unknown Maybe body shaking    Ciprofloxacin Other (See Comments)   unknown   Diltiazem Hcl Er Other (See Comments)   Shaking of arms, eyes not focusing, stuffy    Morphine Nausea And Vomiting, Other (See Comments)   Could not stand up   Morpholine  Salicylate Other (See Comments)   Could not stand up   Prednisolone Other (See Comments)   unknown   Gabapentin Other (See Comments), Hives   Hives   Losartan Potassium Palpitations   Ofloxacin Other (See Comments)   "Felt like she was dying"   Prednisone Rash, Other (See Comments)   Facial numbness        Medication List     TAKE these medications    acetaminophen 500 MG tablet Commonly  known as: TYLENOL Take 500 mg by mouth 2 (two) times daily as needed (knee pain).   amitriptyline 10 MG tablet Commonly known as: ELAVIL Take 10 mg by mouth in the morning and at bedtime.   ASPERCREME EX Apply 1 application. topically daily as needed (knee pain).   aspirin EC 81 MG tablet Take 1 tablet (81 mg total) by mouth 2 (two) times daily after a meal. For 2 weeks then once a day for 2 weeks for blood clot prevention.   azelastine 0.1 % nasal spray Commonly known as: ASTELIN Place 1 spray into both nostrils 2 (two) times daily. Use in each nostril as directed   diclofenac Sodium 1 % Gel Commonly known as: VOLTAREN Apply 1 application. topically See admin instructions. 1 application 2-3 times a day   dicyclomine 10 MG capsule Commonly known as: BENTYL Take 10 mg by mouth 2 (two) times daily.   famotidine 20 MG tablet Commonly known as: PEPCID Take 20-60 mg by mouth daily.   HYDROcodone-acetaminophen 5-325 MG tablet Commonly known as: NORCO/VICODIN Take 1-2 tablets by mouth every 6 (six) hours as needed for moderate pain or severe pain (Post op pain max 6 per day).   metoprolol succinate 50 MG 24 hr tablet Commonly known as: TOPROL-XL Take 50 mg by mouth daily.   OVER THE COUNTER MEDICATION Take 1 tablet by mouth See admin instructions. 1 chew twice every other day or when able to remember.  Beet chews   pregabalin 100 MG capsule Commonly known as: LYRICA Take 100 mg by mouth 3 (three) times daily.   REFRESH OP Place 1 drop into both eyes daily as needed (dry  eye).   tiZANidine 4 MG tablet Commonly known as: Zanaflex Take 1 tablet (4 mg total) by mouth every 6 (six) hours as needed for muscle spasms.   triamterene-hydrochlorothiazide 37.5-25 MG tablet Commonly known as: MAXZIDE-25 Take 1 tablet by mouth daily as needed (fluid).   vitamin B-12 1000 MCG tablet Commonly known as: CYANOCOBALAMIN Take 1,000 mcg by mouth daily.   VITAMIN D3 PO Take 1,000 mcg by mouth daily. When able to remember               Durable Medical Equipment  (From admission, onward)           Start     Ordered   04/01/22 1739  DME Walker rolling  Once       Question:  Patient needs a walker to treat with the following condition  Answer:  Primary osteoarthritis of right knee   04/01/22 1738   04/01/22 1739  DME 3 n 1  Once        04/01/22 1738   04/01/22 1739  DME Bedside commode  Once       Question:  Patient needs a bedside commode to treat with the following condition  Answer:  Primary osteoarthritis of right knee   04/01/22 1738            Diagnostic Studies: No results found.  Disposition: Discharge disposition: 01-Home or Self Care       Discharge Instructions     Call MD / Call 911   Complete by: As directed    If you experience chest pain or shortness of breath, CALL 911 and be transported to the hospital emergency room.  If you develope a fever above 101 F, pus (white drainage) or increased drainage or redness at the wound, or calf pain, call your surgeon's office.  Call MD / Call 911   Complete by: As directed    If you experience chest pain or shortness of breath, CALL 911 and be transported to the hospital emergency room.  If you develope a fever above 101 F, pus (white drainage) or increased drainage or redness at the wound, or calf pain, call your surgeon's office.   Constipation Prevention   Complete by: As directed    Drink plenty of fluids.  Prune juice may be helpful.  You may use a stool softener, such as Colace  (over the counter) 100 mg twice a day.  Use MiraLax (over the counter) for constipation as needed.   Constipation Prevention   Complete by: As directed    Drink plenty of fluids.  Prune juice may be helpful.  You may use a stool softener, such as Colace (over the counter) 100 mg twice a day.  Use MiraLax (over the counter) for constipation as needed.   Diet - low sodium heart healthy   Complete by: As directed    Diet - low sodium heart healthy   Complete by: As directed    Discharge instructions   Complete by: As directed    INSTRUCTIONS AFTER JOINT REPLACEMENT   Remove items at home which could result in a fall. This includes throw rugs or furniture in walking pathways ICE to the affected joint every three hours while awake for 30 minutes at a time, for at least the first 3-5 days, and then as needed for pain and swelling.  Continue to use ice for pain and swelling. You may notice swelling that will progress down to the foot and ankle.  This is normal after surgery.  Elevate your leg when you are not up walking on it.   Continue to use the breathing machine you got in the hospital (incentive spirometer) which will help keep your temperature down.  It is common for your temperature to cycle up and down following surgery, especially at night when you are not up moving around and exerting yourself.  The breathing machine keeps your lungs expanded and your temperature down.   DIET:  As you were doing prior to hospitalization, we recommend a well-balanced diet.  DRESSING / WOUND CARE / SHOWERING  You may shower 3 days after surgery, but keep the wounds dry during showering.  You may use an occlusive plastic wrap (Press'n Seal for example), NO SOAKING/SUBMERGING IN THE BATHTUB.  If the bandage gets wet, change with a clean dry gauze.  If the incision gets wet, pat the wound dry with a clean towel.  ACTIVITY  Increase activity slowly as tolerated, but follow the weight bearing instructions below.    No driving for 6 weeks or until further direction given by your physician.  You cannot drive while taking narcotics.  No lifting or carrying greater than 10 lbs. until further directed by your surgeon. Avoid periods of inactivity such as sitting longer than an hour when not asleep. This helps prevent blood clots.  You may return to work once you are authorized by your doctor.     WEIGHT BEARING   Weight bearing as tolerated with assist device (walker, cane, etc) as directed, use it as long as suggested by your surgeon or therapist, typically at least 4-6 weeks.   EXERCISES  Results after joint replacement surgery are often greatly improved when you follow the exercise, range of motion and muscle strengthening exercises prescribed by your doctor. Safety measures are also important to  protect the joint from further injury. Any time any of these exercises cause you to have increased pain or swelling, decrease what you are doing until you are comfortable again and then slowly increase them. If you have problems or questions, call your caregiver or physical therapist for advice.   Rehabilitation is important following a joint replacement. After just a few days of immobilization, the muscles of the leg can become weakened and shrink (atrophy).  These exercises are designed to build up the tone and strength of the thigh and leg muscles and to improve motion. Often times heat used for twenty to thirty minutes before working out will loosen up your tissues and help with improving the range of motion but do not use heat for the first two weeks following surgery (sometimes heat can increase post-operative swelling).   These exercises can be done on a training (exercise) mat, on the floor, on a table or on a bed. Use whatever works the best and is most comfortable for you.    Use music or television while you are exercising so that the exercises are a pleasant break in your day. This will make your life  better with the exercises acting as a break in your routine that you can look forward to.   Perform all exercises about fifteen times, three times per day or as directed.  You should exercise both the operative leg and the other leg as well.  Exercises include:   Quad Sets - Tighten up the muscle on the front of the thigh (Quad) and hold for 5-10 seconds.   Straight Leg Raises - With your knee straight (if you were given a brace, keep it on), lift the leg to 60 degrees, hold for 3 seconds, and slowly lower the leg.  Perform this exercise against resistance later as your leg gets stronger.  Leg Slides: Lying on your back, slowly slide your foot toward your buttocks, bending your knee up off the floor (only go as far as is comfortable). Then slowly slide your foot back down until your leg is flat on the floor again.  Angel Wings: Lying on your back spread your legs to the side as far apart as you can without causing discomfort.  Hamstring Strength:  Lying on your back, push your heel against the floor with your leg straight by tightening up the muscles of your buttocks.  Repeat, but this time bend your knee to a comfortable angle, and push your heel against the floor.  You may put a pillow under the heel to make it more comfortable if necessary.   A rehabilitation program following joint replacement surgery can speed recovery and prevent re-injury in the future due to weakened muscles. Contact your doctor or a physical therapist for more information on knee rehabilitation.    CONSTIPATION  Constipation is defined medically as fewer than three stools per week and severe constipation as less than one stool per week.  Even if you have a regular bowel pattern at home, your normal regimen is likely to be disrupted due to multiple reasons following surgery.  Combination of anesthesia, postoperative narcotics, change in appetite and fluid intake all can affect your bowels.   YOU MUST use at least one of the  following options; they are listed in order of increasing strength to get the job done.  They are all available over the counter, and you may need to use some, POSSIBLY even all of these options:    Drink plenty  of fluids (prune juice may be helpful) and high fiber foods Colace 100 mg by mouth twice a day  Senokot for constipation as directed and as needed Dulcolax (bisacodyl), take with full glass of water  Miralax (polyethylene glycol) once or twice a day as needed.  If you have tried all these things and are unable to have a bowel movement in the first 3-4 days after surgery call either your surgeon or your primary doctor.    If you experience loose stools or diarrhea, hold the medications until you stool forms back up.  If your symptoms do not get better within 1 week or if they get worse, check with your doctor.  If you experience "the worst abdominal pain ever" or develop nausea or vomiting, please contact the office immediately for further recommendations for treatment.   ITCHING:  If you experience itching with your medications, try taking only a single pain pill, or even half a pain pill at a time.  You can also use Benadryl over the counter for itching or also to help with sleep.   TED HOSE STOCKINGS:  Use stockings on both legs until for at least 2 weeks or as directed by physician office. They may be removed at night for sleeping.  MEDICATIONS:  See your medication summary on the "After Visit Summary" that nursing will review with you.  You may have some home medications which will be placed on hold until you complete the course of blood thinner medication.  It is important for you to complete the blood thinner medication as prescribed.  PRECAUTIONS:  If you experience chest pain or shortness of breath - call 911 immediately for transfer to the hospital emergency department.   If you develop a fever greater that 101 F, purulent drainage from wound, increased redness or drainage from  wound, foul odor from the wound/dressing, or calf pain - CONTACT YOUR SURGEON.                                                   FOLLOW-UP APPOINTMENTS:  If you do not already have a post-op appointment, please call the office for an appointment to be seen by your surgeon.  Guidelines for how soon to be seen are listed in your "After Visit Summary", but are typically between 1-4 weeks after surgery.  OTHER INSTRUCTIONS:   Knee Replacement:  Do not place pillow under knee, focus on keeping the knee straight while resting. CPM instructions: 0-90 degrees, 2 hours in the morning, 2 hours in the afternoon, and 2 hours in the evening. Place foam block, curve side up under heel at all times except when in CPM or when walking.  DO NOT modify, tear, cut, or change the foam block in any way.  POST-OPERATIVE OPIOID TAPER INSTRUCTIONS: It is important to wean off of your opioid medication as soon as possible. If you do not need pain medication after your surgery it is ok to stop day one. Opioids include: Codeine, Hydrocodone(Norco, Vicodin), Oxycodone(Percocet, oxycontin) and hydromorphone amongst others.  Long term and even short term use of opiods can cause: Increased pain response Dependence Constipation Depression Respiratory depression And more.  Withdrawal symptoms can include Flu like symptoms Nausea, vomiting And more Techniques to manage these symptoms Hydrate well Eat regular healthy meals Stay active Use relaxation techniques(deep breathing, meditating, yoga) Do  Not substitute Alcohol to help with tapering If you have been on opioids for less than two weeks and do not have pain than it is ok to stop all together.  Plan to wean off of opioids This plan should start within one week post op of your joint replacement. Maintain the same interval or time between taking each dose and first decrease the dose.  Cut the total daily intake of opioids by one tablet each day Next start to  increase the time between doses. The last dose that should be eliminated is the evening dose.     MAKE SURE YOU:  Understand these instructions.  Get help right away if you are not doing well or get worse.    Thank you for letting us be a part of your medical care team.  It is a privilege we respect greatly.  We hope these instructions will help you stay on track for a fast and full recovery!   Discharge instructions   Complete by: As directed    INSTRUCTIONS AFTER JOINT REPLACEMENT   Remove items at home which could result in a fall. This includes throw rugs or furniture in walking pathways ICE to the affected joint every three hours while awake for 30 minutes at a time, for at least the first 3-5 days, and then as needed for pain and swelling.  Continue to use ice for pain and swelling. You may notice swelling that will progress down to the foot and ankle.  This is normal after surgery.  Elevate your leg when you are not up walking on it.   Continue to use the breathing machine you got in the hospital (incentive spirometer) which will help keep your temperature down.  It is common for your temperature to cycle up and down following surgery, especially at night when you are not up moving around and exerting yourself.  The breathing machine keeps your lungs expanded and your temperature down.   DIET:  As you were doing prior to hospitalization, we recommend a well-balanced diet.  DRESSING / WOUND CARE / SHOWERING  You may shower 3 days after surgery, but keep the wounds dry during showering.  You may use an occlusive plastic wrap (Press'n Seal for example), NO SOAKING/SUBMERGING IN THE BATHTUB.  If the bandage gets wet, change with a clean dry gauze.  If the incision gets wet, pat the wound dry with a clean towel.  ACTIVITY  Increase activity slowly as tolerated, but follow the weight bearing instructions below.   No driving for 6 weeks or until further direction given by your physician.   You cannot drive while taking narcotics.  No lifting or carrying greater than 10 lbs. until further directed by your surgeon. Avoid periods of inactivity such as sitting longer than an hour when not asleep. This helps prevent blood clots.  You may return to work once you are authorized by your doctor.     WEIGHT BEARING   Weight bearing as tolerated with assist device (walker, cane, etc) as directed, use it as long as suggested by your surgeon or therapist, typically at least 4-6 weeks.   EXERCISES  Results after joint replacement surgery are often greatly improved when you follow the exercise, range of motion and muscle strengthening exercises prescribed by your doctor. Safety measures are also important to protect the joint from further injury. Any time any of these exercises cause you to have increased pain or swelling, decrease what you are doing until you are  comfortable again and then slowly increase them. If you have problems or questions, call your caregiver or physical therapist for advice.   Rehabilitation is important following a joint replacement. After just a few days of immobilization, the muscles of the leg can become weakened and shrink (atrophy).  These exercises are designed to build up the tone and strength of the thigh and leg muscles and to improve motion. Often times heat used for twenty to thirty minutes before working out will loosen up your tissues and help with improving the range of motion but do not use heat for the first two weeks following surgery (sometimes heat can increase post-operative swelling).   These exercises can be done on a training (exercise) mat, on the floor, on a table or on a bed. Use whatever works the best and is most comfortable for you.    Use music or television while you are exercising so that the exercises are a pleasant break in your day. This will make your life better with the exercises acting as a break in your routine that you can look  forward to.   Perform all exercises about fifteen times, three times per day or as directed.  You should exercise both the operative leg and the other leg as well.  Exercises include:   Quad Sets - Tighten up the muscle on the front of the thigh (Quad) and hold for 5-10 seconds.   Straight Leg Raises - With your knee straight (if you were given a brace, keep it on), lift the leg to 60 degrees, hold for 3 seconds, and slowly lower the leg.  Perform this exercise against resistance later as your leg gets stronger.  Leg Slides: Lying on your back, slowly slide your foot toward your buttocks, bending your knee up off the floor (only go as far as is comfortable). Then slowly slide your foot back down until your leg is flat on the floor again.  Angel Wings: Lying on your back spread your legs to the side as far apart as you can without causing discomfort.  Hamstring Strength:  Lying on your back, push your heel against the floor with your leg straight by tightening up the muscles of your buttocks.  Repeat, but this time bend your knee to a comfortable angle, and push your heel against the floor.  You may put a pillow under the heel to make it more comfortable if necessary.   A rehabilitation program following joint replacement surgery can speed recovery and prevent re-injury in the future due to weakened muscles. Contact your doctor or a physical therapist for more information on knee rehabilitation.    CONSTIPATION  Constipation is defined medically as fewer than three stools per week and severe constipation as less than one stool per week.  Even if you have a regular bowel pattern at home, your normal regimen is likely to be disrupted due to multiple reasons following surgery.  Combination of anesthesia, postoperative narcotics, change in appetite and fluid intake all can affect your bowels.   YOU MUST use at least one of the following options; they are listed in order of increasing strength to get the  job done.  They are all available over the counter, and you may need to use some, POSSIBLY even all of these options:    Drink plenty of fluids (prune juice may be helpful) and high fiber foods Colace 100 mg by mouth twice a day  Senokot for constipation as directed and as needed  Dulcolax (bisacodyl), take with full glass of water  Miralax (polyethylene glycol) once or twice a day as needed.  If you have tried all these things and are unable to have a bowel movement in the first 3-4 days after surgery call either your surgeon or your primary doctor.    If you experience loose stools or diarrhea, hold the medications until you stool forms back up.  If your symptoms do not get better within 1 week or if they get worse, check with your doctor.  If you experience "the worst abdominal pain ever" or develop nausea or vomiting, please contact the office immediately for further recommendations for treatment.   ITCHING:  If you experience itching with your medications, try taking only a single pain pill, or even half a pain pill at a time.  You can also use Benadryl over the counter for itching or also to help with sleep.   TED HOSE STOCKINGS:  Use stockings on both legs until for at least 2 weeks or as directed by physician office. They may be removed at night for sleeping.  MEDICATIONS:  See your medication summary on the "After Visit Summary" that nursing will review with you.  You may have some home medications which will be placed on hold until you complete the course of blood thinner medication.  It is important for you to complete the blood thinner medication as prescribed.  PRECAUTIONS:  If you experience chest pain or shortness of breath - call 911 immediately for transfer to the hospital emergency department.   If you develop a fever greater that 101 F, purulent drainage from wound, increased redness or drainage from wound, foul odor from the wound/dressing, or calf pain - CONTACT YOUR SURGEON.                                                    FOLLOW-UP APPOINTMENTS:  If you do not already have a post-op appointment, please call the office for an appointment to be seen by your surgeon.  Guidelines for how soon to be seen are listed in your "After Visit Summary", but are typically between 1-4 weeks after surgery.  OTHER INSTRUCTIONS:   Knee Replacement:  Do not place pillow under knee, focus on keeping the knee straight while resting. CPM instructions: 0-90 degrees, 2 hours in the morning, 2 hours in the afternoon, and 2 hours in the evening. Place foam block, curve side up under heel at all times except when in CPM or when walking.  DO NOT modify, tear, cut, or change the foam block in any way.  POST-OPERATIVE OPIOID TAPER INSTRUCTIONS: It is important to wean off of your opioid medication as soon as possible. If you do not need pain medication after your surgery it is ok to stop day one. Opioids include: Codeine, Hydrocodone(Norco, Vicodin), Oxycodone(Percocet, oxycontin) and hydromorphone amongst others.  Long term and even short term use of opiods can cause: Increased pain response Dependence Constipation Depression Respiratory depression And more.  Withdrawal symptoms can include Flu like symptoms Nausea, vomiting And more Techniques to manage these symptoms Hydrate well Eat regular healthy meals Stay active Use relaxation techniques(deep breathing, meditating, yoga) Do Not substitute Alcohol to help with tapering If you have been on opioids for less than two weeks and do not have pain than it is ok to  stop all together.  Plan to wean off of opioids This plan should start within one week post op of your joint replacement. Maintain the same interval or time between taking each dose and first decrease the dose.  Cut the total daily intake of opioids by one tablet each day Next start to increase the time between doses. The last dose that should be eliminated is the  evening dose.     MAKE SURE YOU:  Understand these instructions.  Get help right away if you are not doing well or get worse.    Thank you for letting us be a part of your medical care team.  It is a privilege we respect greatly.  We hope these instructions will help you stay on track for a fast and full recovery!   Increase activity slowly as tolerated   Complete by: As directed    Increase activity slowly as tolerated   Complete by: As directed    Post-operative opioid taper instructions:   Complete by: As directed    POST-OPERATIVE OPIOID TAPER INSTRUCTIONS: It is important to wean off of your opioid medication as soon as possible. If you do not need pain medication after your surgery it is ok to stop day one. Opioids include: Codeine, Hydrocodone(Norco, Vicodin), Oxycodone(Percocet, oxycontin) and hydromorphone amongst others.  Long term and even short term use of opiods can cause: Increased pain response Dependence Constipation Depression Respiratory depression And more.  Withdrawal symptoms can include Flu like symptoms Nausea, vomiting And more Techniques to manage these symptoms Hydrate well Eat regular healthy meals Stay active Use relaxation techniques(deep breathing, meditating, yoga) Do Not substitute Alcohol to help with tapering If you have been on opioids for less than two weeks and do not have pain than it is ok to stop all together.  Plan to wean off of opioids This plan should start within one week post op of your joint replacement. Maintain the same interval or time between taking each dose and first decrease the dose.  Cut the total daily intake of opioids by one tablet each day Next start to increase the time between doses. The last dose that should be eliminated is the evening dose.      Post-operative opioid taper instructions:   Complete by: As directed    POST-OPERATIVE OPIOID TAPER INSTRUCTIONS: It is important to wean off of your opioid  medication as soon as possible. If you do not need pain medication after your surgery it is ok to stop day one. Opioids include: Codeine, Hydrocodone(Norco, Vicodin), Oxycodone(Percocet, oxycontin) and hydromorphone amongst others.  Long term and even short term use of opiods can cause: Increased pain response Dependence Constipation Depression Respiratory depression And more.  Withdrawal symptoms can include Flu like symptoms Nausea, vomiting And more Techniques to manage these symptoms Hydrate well Eat regular healthy meals Stay active Use relaxation techniques(deep breathing, meditating, yoga) Do Not substitute Alcohol to help with tapering If you have been on opioids for less than two weeks and do not have pain than it is ok to stop all together.  Plan to wean off of opioids This plan should start within one week post op of your joint replacement. Maintain the same interval or time between taking each dose and first decrease the dose.  Cut the total daily intake of opioids by one tablet each day Next start to increase the time between doses. The last dose that should be eliminated is the evening dose.  Follow-up Information     Melrose Nakayama, MD. Schedule an appointment as soon as possible for a visit in 2 week(s).   Specialty: Orthopedic Surgery Contact information: [place] [address] [phone]                  Signed: Larwance Sachs Lance [place] 04/02/2022, 8:17 AM

## 2022-04-02 NOTE — TOC Transition Note (Signed)
Transition of Care (TOC) - CM/SW Discharge Note   Patient Details  Name: [name] MRN: [id number] Date of Birth: 08/10/1941  Transition of Care (TOC) CM/SW Contact:  HOYLE, LUCY, LCSW Phone Number: 04/02/2022, 10:33 AM   Clinical Narrative:     Met with pt and confirming she has received RW via [employer].  Pt prearranged with [facility] for HHPT.  No further TOC needs.  Final next level of care: Home w Home Health Services Barriers to Discharge: No Barriers Identified   Patient Goals and CMS Choice Patient states their goals for this hospitalization and ongoing recovery are:: return home      Discharge Placement                       Discharge Plan and Services                DME Arranged: Walker rolling DME Agency: [employer]       HH Arranged: PT HH Agency: [facility]        Social Determinants of Health (SDOH) Interventions     Readmission Risk Interventions     View : No data to display.             

## 2022-04-03 ENCOUNTER — Encounter (HOSPITAL_COMMUNITY): Payer: Self-pay | Admitting: Orthopaedic Surgery

## 2022-04-03 MED ORDER — TRAMADOL HCL 50 MG PO TABS
50.0000 mg | ORAL_TABLET | Freq: Three times a day (TID) | ORAL | Status: DC | PRN
  Administered 2022-04-03 – 2022-04-04 (×3): 50 mg via ORAL
  Filled 2022-04-03 (×3): qty 1

## 2022-04-03 MED ORDER — SODIUM CHLORIDE 0.9 % IV SOLN: INTRAVENOUS | Status: DC

## 2022-04-03 MED ORDER — ASPIRIN 81 MG PO CHEW: 81.0000 mg | CHEWABLE_TABLET | Freq: Two times a day (BID) | ORAL | 0 refills | Status: DC

## 2022-04-03 MED ORDER — SODIUM CHLORIDE 0.9 % IV BOLUS
500.0000 mL | Freq: Once | INTRAVENOUS | Status: AC
  Administered 2022-04-03: 500 mL via INTRAVENOUS

## 2022-04-03 MED ORDER — TRAMADOL HCL 50 MG PO TABS: 50.0000 mg | ORAL_TABLET | Freq: Three times a day (TID) | ORAL | 0 refills | Status: DC | PRN

## 2022-04-03 MED ORDER — TRAMADOL HCL 50 MG PO TABS
50.0000 mg | ORAL_TABLET | Freq: Three times a day (TID) | ORAL | 0 refills | Status: DC | PRN
Start: 2022-04-03 — End: 2022-04-03

## 2022-04-03 NOTE — Progress Notes (Signed)
Physical Therapy Treatment Patient Details Name: Jennifer Melendez MRN: [id number] DOB: [birth year] Today's Date: 04/03/2022   History of Present Illness Pt is an 81yo female presenting s/p R-TKA on 04/01/22. PMH: hx of cervical caner, fibromyalgia, HLD, HTN, PVC, s/p brain surgery meningioma, back surgery L5.    PT Comments    Pt continued with therapy for mobility, balance, and gait deficits. Pt making limited progress due to pain. Pt continues to require Min-Mod assist for bed mobility and transfers with RW and was unable to progress to gait due to pain and hypotension. Educated on ankle pumps to promote circulation and importance of mobility. Will continue to progress as able.   Recommendations for follow up therapy are one component of a multi-disciplinary discharge planning process, led by the attending physician.  Recommendations may be updated based on patient status, additional functional criteria and insurance authorization.  Follow Up Recommendations  Follow physician's recommendations for discharge plan and follow up therapies     Assistance Recommended at Discharge Frequent or constant Supervision/Assistance  Patient can return home with the following A lot of help with walking and/or transfers;A lot of help with bathing/dressing/bathroom;Assistance with cooking/housework;Direct supervision/assist for medications management;Assist for transportation;Help with stairs or ramp for entrance   Equipment Recommendations  None recommended by PT    Recommendations for Other Services       Precautions / Restrictions Precautions Precautions: Fall;Knee Precaution Booklet Issued: No Required Braces or Orthoses: Knee Immobilizer - Right Knee Immobilizer - Right: On when out of bed or walking Restrictions Weight Bearing Restrictions: No RLE Weight Bearing: Weight bearing as tolerated     Mobility  Bed Mobility Overal bed mobility: Needs Assistance Bed Mobility: Supine to Sit, Sit  to Supine     Supine to sit: Mod assist Sit to supine: Min assist   General bed mobility comments: Pt required Mod assist for first supine to sit, needing several verbal cues of hand placement and repeated instructions to continue scooting to EOB. Therapist facilitated assistance with moving involved leg during transfer. Min assist during sit to supine at EOS with facilitation of moving involved leg during transfer.    Transfers Overall transfer level: Needs assistance Equipment used: Rolling walker (2 wheels) Transfers: Sit to/from Stand, Bed to chair/wheelchair/BSC Sit to Stand: Mod assist Stand pivot transfers: Mod assist, Min assist         General transfer comment: Pt required verbal cues for hand placement during sit to stand and direction to move RW. Therapist assisted with lift for powerup.  Min-Mod assist for Bed to [facility]. Pt encouraged to keep trying to complete the task after pt repeatedly said, "I can't do it".    Ambulation/Gait                   Stairs             Wheelchair Mobility    Modified Rankin (Stroke Patients Only)       Balance Overall balance assessment: Needs assistance Sitting-balance support: Feet unsupported, Bilateral upper extremity supported Sitting balance-Leahy Scale: Fair     Standing balance support: Bilateral upper extremity supported, Reliant on assistive device for balance Standing balance-Leahy Scale: Poor Standing balance comment: Pt reported constant dizziness during standing, was asked to not slump down during standing.                            Cognition Arousal/Alertness: Awake/alert Behavior During Therapy: WFL for  tasks assessed/performed Overall Cognitive Status: Within Functional Limits for tasks assessed                                          Exercises Total Joint Exercises Ankle Circles/Pumps: AROM, Both, 20 reps, Seated    General Comments        Pertinent  Vitals/Pain Pain Assessment Pain Assessment: 0-10 Pain Score: 8  Pain Location: right knee Pain Descriptors / Indicators: Discomfort, Guarding, Operative site guarding, Tender Pain Intervention(s): Limited activity within patient's tolerance, Monitored during session, Repositioned    Home Living                          Prior Function            PT Goals (current goals can now be found in the care plan section) Acute Rehab PT Goals Patient Stated Goal: To go home PT Goal Formulation: With patient/family Time For Goal Achievement: 04/08/22 Potential to Achieve Goals: Fair Progress towards PT goals: Not progressing toward goals - comment;PT to reassess next treatment (Pt reports dizziness while standing, had orthostatic hypotension during standing, denied ambulation.)    Frequency    7X/week      PT Plan Discharge plan needs to be updated    Co-evaluation              AM-PAC PT "6 Clicks" Mobility   Outcome Measure  Help needed turning from your back to your side while in a flat bed without using bedrails?: A Little Help needed moving from lying on your back to sitting on the side of a flat bed without using bedrails?: A Lot Help needed moving to and from a bed to a chair (including a wheelchair)?: A Lot Help needed standing up from a chair using your arms (e.g., wheelchair or bedside chair)?: A Lot Help needed to walk in hospital room?: A Lot Help needed climbing 3-5 steps with a railing? : A Lot 6 Click Score: 13    End of Session Equipment Utilized During Treatment: Gait belt;Right knee immobilizer Activity Tolerance: Patient limited by fatigue;Patient limited by pain   Nurse Communication: Mobility status PT Visit Diagnosis: Unsteadiness on feet (R26.81);Other abnormalities of gait and mobility (R26.89);Repeated falls (R29.6);Muscle weakness (generalized) (M62.81);Dizziness and giddiness (R42);Pain Pain - Right/Left: Right Pain - part of body:  Knee     Time:  -     Charges:                        Margie Ege, [place] 04/03/2022, 11:51 AM

## 2022-04-03 NOTE — Progress Notes (Signed)
Physical Therapy Treatment Patient Details Name: Jennifer Melendez MRN: [id number] DOB: 1941-01-16 Today's Date: 04/03/2022   History of Present Illness Pt is an 81yo female presenting s/p R-TKA on 04/01/22. PMH: hx of cervical caner, fibromyalgia, HLD, HTN, PVC, s/p brain surgery meningioma, back surgery L5.    PT Comments    Pt remains limited by pain, demonstrated increased motivation and was able to ambulate to bathroom and back to bed this session with Mod assist. Pt requires Mod to max assist for sit to stand transfers with repeated cues for safety and technique. Her daughter reported pt appears less focused and less cautious, suspect potentially related to medication. Pt is slightly impulsive. PT recommends continued skilled therapy so patient can mobilize safely.   Recommendations for follow up therapy are one component of a multi-disciplinary discharge planning process, led by the attending physician.  Recommendations may be updated based on patient status, additional functional criteria and insurance authorization.  Follow Up Recommendations  Follow physician's recommendations for discharge plan and follow up therapies     Assistance Recommended at Discharge Frequent or constant Supervision/Assistance  Patient can return home with the following A lot of help with walking and/or transfers;A lot of help with bathing/dressing/bathroom;Assistance with cooking/housework;Direct supervision/assist for medications management;Direct supervision/assist for financial management;Assist for transportation;Help with stairs or ramp for entrance   Equipment Recommendations  None recommended by PT    Recommendations for Other Services       Precautions / Restrictions Precautions Precautions: Fall;Knee Precaution Booklet Issued: No Required Braces or Orthoses: Knee Immobilizer - Right Knee Immobilizer - Right: On when out of bed or walking Restrictions Weight Bearing Restrictions: No RLE  Weight Bearing: Weight bearing as tolerated     Mobility  Bed Mobility Overal bed mobility: Needs Assistance Bed Mobility: Supine to Sit, Sit to Supine     Supine to sit: Min assist Sit to supine: Min assist   General bed mobility comments: Pt used Bil UE to raise trunk into upright position and sit to EOB, needed therapist assist to carry Bil legs over to floor.    Transfers Overall transfer level: Needs assistance Equipment used: Rolling walker (2 wheels) Transfers: Sit to/from Stand Sit to Stand: Mod assist Stand pivot transfers: Mod assist, Min assist         General transfer comment: Pt required verbal cues for Bil feet placement for standing.    Ambulation/Gait Ambulation/Gait assistance: Min assist Gait Distance (Feet): 10 Feet Assistive device: Rolling walker (2 wheels) Gait Pattern/deviations: Step-to pattern, Decreased step length - right, Decreased step length - left, Decreased stride length, Antalgic, Trunk flexed Gait velocity: decr     General Gait Details: Pt ambulated from bed to bathroom needing repeated verbal cues for upright trunk posture during ambulation.   Stairs             Wheelchair Mobility    Modified Rankin (Stroke Patients Only)       Balance Overall balance assessment: Needs assistance Sitting-balance support: Bilateral upper extremity supported, Feet unsupported Sitting balance-Leahy Scale: Fair Sitting balance - Comments: Pt used Bil UE for sitting balance support.   Standing balance support: Bilateral upper extremity supported, Reliant on assistive device for balance Standing balance-Leahy Scale: Fair Standing balance comment: Pt relied on external support for standing balance using Bil UE on RW.                            Cognition Arousal/Alertness:  Awake/alert Behavior During Therapy: Anxious, Impulsive Overall Cognitive Status: Impaired/Different from baseline Area of Impairment: Attention, Following  commands, Safety/judgement, Awareness, Problem solving                   Current Attention Level: Selective   Following Commands: Follows one step commands consistently, Follows multi-step commands with increased time Safety/Judgement: Decreased awareness of safety Awareness: Anticipatory Problem Solving: Requires verbal cues, Requires tactile cues, Decreased initiation General Comments: Pt presented as giddy/silly, demonstrating spontaneous movements.        Exercises Total Joint Exercises Ankle Circles/Pumps: AROM, Both, 20 reps, Seated    General Comments        Pertinent Vitals/Pain Pain Assessment Pain Assessment: 0-10 Pain Score: 8  Pain Location: right knee Pain Descriptors / Indicators: Discomfort, Guarding, Operative site guarding, Tender Pain Intervention(s): Limited activity within patient's tolerance, Monitored during session, Repositioned, Ice applied    Home Living                          Prior Function            PT Goals (current goals can now be found in the care plan section) Acute Rehab PT Goals Patient Stated Goal: To go home and be without pain PT Goal Formulation: With patient/family Time For Goal Achievement: 04/08/22 Potential to Achieve Goals: Good Progress towards PT goals: Progressing toward goals    Frequency    7X/week      PT Plan Current plan remains appropriate    Co-evaluation              AM-PAC PT "6 Clicks" Mobility   Outcome Measure  Help needed turning from your back to your side while in a flat bed without using bedrails?: None Help needed moving from lying on your back to sitting on the side of a flat bed without using bedrails?: A Little Help needed moving to and from a bed to a chair (including a wheelchair)?: A Little Help needed standing up from a chair using your arms (e.g., wheelchair or bedside chair)?: A Lot Help needed to walk in hospital room?: A Lot Help needed climbing 3-5 steps  with a railing? : A Lot 6 Click Score: 16    End of Session Equipment Utilized During Treatment: Gait belt;Right knee immobilizer Activity Tolerance: Patient limited by pain Patient left: in bed;with call bell/phone within reach;with family/visitor present Nurse Communication: Mobility status PT Visit Diagnosis: Unsteadiness on feet (R26.81);Other abnormalities of gait and mobility (R26.89);Repeated falls (R29.6);Pain Pain - Right/Left: Right Pain - part of body: Knee     Time: 0086-7619 PT Time Calculation (min) (ACUTE ONLY): 22 min  Charges:  $Gait Training: 8-22 mins                     Margie Ege, SPT [place] 04/03/2022, 4:06 PM

## 2022-04-03 NOTE — Discharge Summary (Cosign Needed)
Patient ID: Jennifer Melendez MRN: [id number] DOB/AGE: 1941-03-22 81 y.o.  Admit date: 04/01/2022 Discharge date: 04/03/2022  Admission Diagnoses:  Principal Problem:   Primary osteoarthritis of right knee   Discharge Diagnoses:  Same  Past Medical History:  Diagnosis Date   Acute sphenoidal sinusitis 06/28/2014   Last Assessment & Plan:  Formatting of this note might be different from the original. Pt will continue nasal steroid. Z pack sent to pharmacy.   Anemia    Axillary fullness 04/06/2017   Barrett's esophagus with esophagitis 07/04/2015   Cancer ([place])    1979 cervical   Chronic fatigue 06/28/2014   Last Assessment & Plan:  Formatting of this note might be different from the original. Psychological condition is worsening. May need to do sleep study to r/o OSA check labs today Psychological condition  will be reassessed in 3 months.   Constipation    Fibromyalgia    Gastritis 10/07/2016   Headache    Hx of bilateral breast implants 04/06/2017   Hyperlipemia 05/26/2014   Last Assessment & Plan:  Formatting of this note might be different from the original. Lipid abnormalities are unchanged. Pt off cholesterol medicine Nutritional counseling was provided. and Pharmacotherapy as ordered. Lipids will be reassessed in 6 months. Formatting of this note might be different from the original. Last Assessment & Plan:  Lipid abnormalities are unchanged. Pt off cholesterol m   Hyperlipidemia    Hypertension 05/26/2014   Last Assessment & Plan:  Formatting of this note might be different from the original. Hypertension is improving with treatment. Continue current treatment regimen. Dietary sodium restriction. Regular aerobic exercise. Blood pressure will be reassessed in 4 months. Formatting of this note might be different from the original. Last Assessment & Plan:  Hypertension is improving with treatment. Conti   Meningioma [facility]) 05/26/2014   Last Assessment & Plan:  Formatting of this  note might be different from the original. Pt needs f/u brain MRI   Otalgia of left ear 04/16/2016   Other chest pain 06/10/2019   PAC (premature atrial contraction) 07/04/2020   Palpitations    PAT (paroxysmal atrial tachycardia) ([place]) 07/04/2020   Preventative health care 08/14/2015   PVC (premature ventricular contraction)    PVC's (premature ventricular contractions) 06/10/2019   Seasonal allergies 05/26/2014   Last Assessment & Plan:  Formatting of this note might be different from the original. Pt taking OTC allergy med Formatting of this note might be different from the original. 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 female who was admitted 04/01/2022 for operative treatment ofPrimary osteoarthritis of right knee. Patient has severe unremitting pain that affects sleep, daily activities, and work/hobbies. After pre-op clearance the patient was taken to the operating room on 04/01/2022 and underwent  Procedure(s): RIGHT TOTAL KNEE ARTHROPLASTY.    Patient was given perioperative antibiotics:  Anti-infectives (From admission, onward)    Start     Dose/Rate Route Frequency Ordered Stop   04/01/22 1652  ceFAZolin (ANCEF) 2-4 GM/100ML-% IVPB       Note to Pharmacy: Dara Lords M: cabinet override      04/01/22 1652 04/02/22 0459   04/01/22 1530  ceFAZolin (ANCEF) IVPB 2g/100 mL premix        2 g 200 mL/hr over  30 Minutes Intravenous Every 6 hours 04/01/22 1336 04/02/22 0004   04/01/22 0800  ceFAZolin (ANCEF) IVPB 2g/100 mL premix        2 g 200 mL/hr over 30 Minutes Intravenous On call to O.R. 04/01/22 0748 04/01/22 1017        Patient was given sequential compression devices, early ambulation, and chemoprophylaxis to prevent DVT.  Patient benefited maximally from  hospital stay and there were no complications.    Recent vital signs: Patient Vitals for the past 24 hrs:  BP Temp Temp src Pulse Resp SpO2  04/03/22 0550 (!) 154/54 98.4 F (36.9 C) Oral 93 18 92 %  04/02/22 2054 (!) 170/51 97.9 F (36.6 C) -- 90 18 95 %  04/02/22 1323 (!) 134/48 98.7 F (37.1 C) -- 73 17 99 %  04/02/22 0912 -- 97.7 F (36.5 C) -- -- 18 --  04/02/22 0858 (!) 121/48 -- -- 72 -- 97 %     Recent laboratory studies: No results for input(s): "WBC", "HGB", "HCT", "PLT", "NA", "K", "CL", "CO2", "BUN", "CREATININE", "GLUCOSE", "INR", "CALCIUM" in the last 72 hours.  Invalid input(s): "PT", "2"   Discharge Medications:   Allergies as of 04/03/2022       Reactions   Amlodipine Besylate Other (See Comments)   Unknown Maybe body shaking    Ciprofloxacin Other (See Comments)   unknown   Diltiazem Hcl Er Other (See Comments)   Shaking of arms, eyes not focusing, stuffy    Morphine Nausea And Vomiting, Other (See Comments)   Could not stand up   Morpholine Salicylate Other (See Comments)   Could not stand up   Prednisolone Other (See Comments)   unknown   Gabapentin Other (See Comments), Hives   Hives   Losartan Potassium Palpitations   Ofloxacin Other (See Comments)   "Felt like she was dying"   Prednisone Rash, Other (See Comments)   Facial numbness        Medication List     TAKE these medications    acetaminophen 500 MG tablet Commonly known as: TYLENOL Take 500 mg by mouth 2 (two) times daily as needed (knee pain).   amitriptyline 10 MG tablet Commonly known as: ELAVIL Take 10 mg by mouth in the morning and at bedtime.   ASPERCREME EX Apply 1 application. topically daily as needed (knee pain).   aspirin EC 81 MG tablet Take 1 tablet (81 mg total) by mouth 2 (two) times daily after a meal. For 2 weeks then once a day for 2 weeks for blood clot prevention.   azelastine 0.1 % nasal spray Commonly known as: ASTELIN Place 1 spray into both  nostrils 2 (two) times daily. Use in each nostril as directed   diclofenac Sodium 1 % Gel Commonly known as: VOLTAREN Apply 1 application. topically See admin instructions. 1 application 2-3 times a day   dicyclomine 10 MG capsule Commonly known as: BENTYL Take 10 mg by mouth 2 (two) times daily.   famotidine 20 MG tablet Commonly known as: PEPCID Take 20-60 mg by mouth daily.   HYDROcodone-acetaminophen 5-325 MG tablet Commonly known as: NORCO/VICODIN Take 1-2 tablets by mouth every 6 (six) hours as needed for moderate pain or severe pain (Post op pain max 6 per day).   metoprolol succinate 50 MG 24 hr tablet Commonly known as: TOPROL-XL Take 50 mg by mouth daily.   OVER THE COUNTER MEDICATION Take 1 tablet by mouth See admin instructions. 1 chew twice every other  day or when able to remember.  Beet chews   pregabalin 100 MG capsule Commonly known as: LYRICA Take 100 mg by mouth 3 (three) times daily.   REFRESH OP Place 1 drop into both eyes daily as needed (dry eye).   tiZANidine 4 MG tablet Commonly known as: Zanaflex Take 1 tablet (4 mg total) by mouth every 6 (six) hours as needed for muscle spasms.   triamterene-hydrochlorothiazide 37.5-25 MG tablet Commonly known as: MAXZIDE-25 Take 1 tablet by mouth daily as needed (fluid).   vitamin B-12 1000 MCG tablet Commonly known as: CYANOCOBALAMIN Take 1,000 mcg by mouth daily.   VITAMIN D3 PO Take 1,000 mcg by mouth daily. When able to remember               Durable Medical Equipment  (From admission, onward)           Start     Ordered   04/01/22 1739  DME Walker rolling  Once       Question:  Patient needs a walker to treat with the following condition  Answer:  Primary osteoarthritis of right knee   04/01/22 1738   04/01/22 1739  DME 3 n 1  Once        04/01/22 1738   04/01/22 1739  DME Bedside commode  Once       Question:  Patient needs a bedside commode to treat with the following condition   Answer:  Primary osteoarthritis of right knee   04/01/22 1738            Diagnostic Studies: No results found.  Disposition: Discharge disposition: 01-Home or Self Care       Discharge Instructions     Call MD / Call 911   Complete by: As directed    If you experience chest pain or shortness of breath, CALL 911 and be transported to the hospital emergency room.  If you develope a fever above 101 F, pus (white drainage) or increased drainage or redness at the wound, or calf pain, call your surgeon's office.   Call MD / Call 911   Complete by: As directed    If you experience chest pain or shortness of breath, CALL 911 and be transported to the hospital emergency room.  If you develope a fever above 101 F, pus (white drainage) or increased drainage or redness at the wound, or calf pain, call your surgeon's office.   Call MD / Call 911   Complete by: As directed    If you experience chest pain or shortness of breath, CALL 911 and be transported to the hospital emergency room.  If you develope a fever above 101 F, pus (white drainage) or increased drainage or redness at the wound, or calf pain, call your surgeon's office.   Constipation Prevention   Complete by: As directed    Drink plenty of fluids.  Prune juice may be helpful.  You may use a stool softener, such as Colace (over the counter) 100 mg twice a day.  Use MiraLax (over the counter) for constipation as needed.   Constipation Prevention   Complete by: As directed    Drink plenty of fluids.  Prune juice may be helpful.  You may use a stool softener, such as Colace (over the counter) 100 mg twice a day.  Use MiraLax (over the counter) for constipation as needed.   Constipation Prevention   Complete by: As directed    Drink plenty of fluids.  Prune juice may be helpful.  You may use a stool softener, such as Colace (over the counter) 100 mg twice a day.  Use MiraLax (over the counter) for constipation as needed.   Diet - low  sodium heart healthy   Complete by: As directed    Diet - low sodium heart healthy   Complete by: As directed    Diet - low sodium heart healthy   Complete by: As directed    Discharge instructions   Complete by: As directed    INSTRUCTIONS AFTER JOINT REPLACEMENT   Remove items at home which could result in a fall. This includes throw rugs or furniture in walking pathways ICE to the affected joint every three hours while awake for 30 minutes at a time, for at least the first 3-5 days, and then as needed for pain and swelling.  Continue to use ice for pain and swelling. You may notice swelling that will progress down to the foot and ankle.  This is normal after surgery.  Elevate your leg when you are not up walking on it.   Continue to use the breathing machine you got in the hospital (incentive spirometer) which will help keep your temperature down.  It is common for your temperature to cycle up and down following surgery, especially at night when you are not up moving around and exerting yourself.  The breathing machine keeps your lungs expanded and your temperature down.   DIET:  As you were doing prior to hospitalization, we recommend a well-balanced diet.  DRESSING / WOUND CARE / SHOWERING  You may shower 3 days after surgery, but keep the wounds dry during showering.  You may use an occlusive plastic wrap (Press'n Seal for example), NO SOAKING/SUBMERGING IN THE BATHTUB.  If the bandage gets wet, change with a clean dry gauze.  If the incision gets wet, pat the wound dry with a clean towel.  ACTIVITY  Increase activity slowly as tolerated, but follow the weight bearing instructions below.   No driving for 6 weeks or until further direction given by your physician.  You cannot drive while taking narcotics.  No lifting or carrying greater than 10 lbs. until further directed by your surgeon. Avoid periods of inactivity such as sitting longer than an hour when not asleep. This helps  prevent blood clots.  You may return to work once you are authorized by your doctor.     WEIGHT BEARING   Weight bearing as tolerated with assist device (walker, cane, etc) as directed, use it as long as suggested by your surgeon or therapist, typically at least 4-6 weeks.   EXERCISES  Results after joint replacement surgery are often greatly improved when you follow the exercise, range of motion and muscle strengthening exercises prescribed by your doctor. Safety measures are also important to protect the joint from further injury. Any time any of these exercises cause you to have increased pain or swelling, decrease what you are doing until you are comfortable again and then slowly increase them. If you have problems or questions, call your caregiver or physical therapist for advice.   Rehabilitation is important following a joint replacement. After just a few days of immobilization, the muscles of the leg can become weakened and shrink (atrophy).  These exercises are designed to build up the tone and strength of the thigh and leg muscles and to improve motion. Often times heat used for twenty to thirty minutes before working out will loosen up your  tissues and help with improving the range of motion but do not use heat for the first two weeks following surgery (sometimes heat can increase post-operative swelling).   These exercises can be done on a training (exercise) mat, on the floor, on a table or on a bed. Use whatever works the best and is most comfortable for you.    Use music or television while you are exercising so that the exercises are a pleasant break in your day. This will make your life better with the exercises acting as a break in your routine that you can look forward to.   Perform all exercises about fifteen times, three times per day or as directed.  You should exercise both the operative leg and the other leg as well.  Exercises include:   Quad Sets - Tighten up the muscle  on the front of the thigh (Quad) and hold for 5-10 seconds.   Straight Leg Raises - With your knee straight (if you were given a brace, keep it on), lift the leg to 60 degrees, hold for 3 seconds, and slowly lower the leg.  Perform this exercise against resistance later as your leg gets stronger.  Leg Slides: Lying on your back, slowly slide your foot toward your buttocks, bending your knee up off the floor (only go as far as is comfortable). Then slowly slide your foot back down until your leg is flat on the floor again.  Angel Wings: Lying on your back spread your legs to the side as far apart as you can without causing discomfort.  Hamstring Strength:  Lying on your back, push your heel against the floor with your leg straight by tightening up the muscles of your buttocks.  Repeat, but this time bend your knee to a comfortable angle, and push your heel against the floor.  You may put a pillow under the heel to make it more comfortable if necessary.   A rehabilitation program following joint replacement surgery can speed recovery and prevent re-injury in the future due to weakened muscles. Contact your doctor or a physical therapist for more information on knee rehabilitation.    CONSTIPATION  Constipation is defined medically as fewer than three stools per week and severe constipation as less than one stool per week.  Even if you have a regular bowel pattern at home, your normal regimen is likely to be disrupted due to multiple reasons following surgery.  Combination of anesthesia, postoperative narcotics, change in appetite and fluid intake all can affect your bowels.   YOU MUST use at least one of the following options; they are listed in order of increasing strength to get the job done.  They are all available over the counter, and you may need to use some, POSSIBLY even all of these options:    Drink plenty of fluids (prune juice may be helpful) and high fiber foods Colace 100 mg by mouth  twice a day  Senokot for constipation as directed and as needed Dulcolax (bisacodyl), take with full glass of water  Miralax (polyethylene glycol) once or twice a day as needed.  If you have tried all these things and are unable to have a bowel movement in the first 3-4 days after surgery call either your surgeon or your primary doctor.    If you experience loose stools or diarrhea, hold the medications until you stool forms back up.  If your symptoms do not get better within 1 week or if they get worse, check  with your doctor.  If you experience "the worst abdominal pain ever" or develop nausea or vomiting, please contact the office immediately for further recommendations for treatment.   ITCHING:  If you experience itching with your medications, try taking only a single pain pill, or even half a pain pill at a time.  You can also use Benadryl over the counter for itching or also to help with sleep.   TED HOSE STOCKINGS:  Use stockings on both legs until for at least 2 weeks or as directed by physician office. They may be removed at night for sleeping.  MEDICATIONS:  See your medication summary on the "After Visit Summary" that nursing will review with you.  You may have some home medications which will be placed on hold until you complete the course of blood thinner medication.  It is important for you to complete the blood thinner medication as prescribed.  PRECAUTIONS:  If you experience chest pain or shortness of breath - call 911 immediately for transfer to the hospital emergency department.   If you develop a fever greater that 101 F, purulent drainage from wound, increased redness or drainage from wound, foul odor from the wound/dressing, or calf pain - CONTACT YOUR SURGEON.                                                   FOLLOW-UP APPOINTMENTS:  If you do not already have a post-op appointment, please call the office for an appointment to be seen by your surgeon.  Guidelines for how  soon to be seen are listed in your "After Visit Summary", but are typically between 1-4 weeks after surgery.  OTHER INSTRUCTIONS:   Knee Replacement:  Do not place pillow under knee, focus on keeping the knee straight while resting. CPM instructions: 0-90 degrees, 2 hours in the morning, 2 hours in the afternoon, and 2 hours in the evening. Place foam block, curve side up under heel at all times except when in CPM or when walking.  DO NOT modify, tear, cut, or change the foam block in any way.  POST-OPERATIVE OPIOID TAPER INSTRUCTIONS: It is important to wean off of your opioid medication as soon as possible. If you do not need pain medication after your surgery it is ok to stop day one. Opioids include: Codeine, Hydrocodone(Norco, Vicodin), Oxycodone(Percocet, oxycontin) and hydromorphone amongst others.  Long term and even short term use of opiods can cause: Increased pain response Dependence Constipation Depression Respiratory depression And more.  Withdrawal symptoms can include Flu like symptoms Nausea, vomiting And more Techniques to manage these symptoms Hydrate well Eat regular healthy meals Stay active Use relaxation techniques(deep breathing, meditating, yoga) Do Not substitute Alcohol to help with tapering If you have been on opioids for less than two weeks and do not have pain than it is ok to stop all together.  Plan to wean off of opioids This plan should start within one week post op of your joint replacement. Maintain the same interval or time between taking each dose and first decrease the dose.  Cut the total daily intake of opioids by one tablet each day Next start to increase the time between doses. The last dose that should be eliminated is the evening dose.     MAKE SURE YOU:  Understand these instructions.  Get help right away  if you are not doing well or get worse.    Thank you for letting us be a part of your medical care team.  It is a privilege we  respect greatly.  We hope these instructions will help you stay on track for a fast and full recovery!   Discharge instructions   Complete by: As directed    INSTRUCTIONS AFTER JOINT REPLACEMENT   Remove items at home which could result in a fall. This includes throw rugs or furniture in walking pathways ICE to the affected joint every three hours while awake for 30 minutes at a time, for at least the first 3-5 days, and then as needed for pain and swelling.  Continue to use ice for pain and swelling. You may notice swelling that will progress down to the foot and ankle.  This is normal after surgery.  Elevate your leg when you are not up walking on it.   Continue to use the breathing machine you got in the hospital (incentive spirometer) which will help keep your temperature down.  It is common for your temperature to cycle up and down following surgery, especially at night when you are not up moving around and exerting yourself.  The breathing machine keeps your lungs expanded and your temperature down.   DIET:  As you were doing prior to hospitalization, we recommend a well-balanced diet.  DRESSING / WOUND CARE / SHOWERING  You may shower 3 days after surgery, but keep the wounds dry during showering.  You may use an occlusive plastic wrap (Press'n Seal for example), NO SOAKING/SUBMERGING IN THE BATHTUB.  If the bandage gets wet, change with a clean dry gauze.  If the incision gets wet, pat the wound dry with a clean towel.  ACTIVITY  Increase activity slowly as tolerated, but follow the weight bearing instructions below.   No driving for 6 weeks or until further direction given by your physician.  You cannot drive while taking narcotics.  No lifting or carrying greater than 10 lbs. until further directed by your surgeon. Avoid periods of inactivity such as sitting longer than an hour when not asleep. This helps prevent blood clots.  You may return to work once you are authorized by your  doctor.     WEIGHT BEARING   Weight bearing as tolerated with assist device (walker, cane, etc) as directed, use it as long as suggested by your surgeon or therapist, typically at least 4-6 weeks.   EXERCISES  Results after joint replacement surgery are often greatly improved when you follow the exercise, range of motion and muscle strengthening exercises prescribed by your doctor. Safety measures are also important to protect the joint from further injury. Any time any of these exercises cause you to have increased pain or swelling, decrease what you are doing until you are comfortable again and then slowly increase them. If you have problems or questions, call your caregiver or physical therapist for advice.   Rehabilitation is important following a joint replacement. After just a few days of immobilization, the muscles of the leg can become weakened and shrink (atrophy).  These exercises are designed to build up the tone and strength of the thigh and leg muscles and to improve motion. Often times heat used for twenty to thirty minutes before working out will loosen up your tissues and help with improving the range of motion but do not use heat for the first two weeks following surgery (sometimes heat can increase post-operative swelling).  These exercises can be done on a training (exercise) mat, on the floor, on a table or on a bed. Use whatever works the best and is most comfortable for you.    Use music or television while you are exercising so that the exercises are a pleasant break in your day. This will make your life better with the exercises acting as a break in your routine that you can look forward to.   Perform all exercises about fifteen times, three times per day or as directed.  You should exercise both the operative leg and the other leg as well.  Exercises include:   Quad Sets - Tighten up the muscle on the front of the thigh (Quad) and hold for 5-10 seconds.   Straight Leg  Raises - With your knee straight (if you were given a brace, keep it on), lift the leg to 60 degrees, hold for 3 seconds, and slowly lower the leg.  Perform this exercise against resistance later as your leg gets stronger.  Leg Slides: Lying on your back, slowly slide your foot toward your buttocks, bending your knee up off the floor (only go as far as is comfortable). Then slowly slide your foot back down until your leg is flat on the floor again.  Angel Wings: Lying on your back spread your legs to the side as far apart as you can without causing discomfort.  Hamstring Strength:  Lying on your back, push your heel against the floor with your leg straight by tightening up the muscles of your buttocks.  Repeat, but this time bend your knee to a comfortable angle, and push your heel against the floor.  You may put a pillow under the heel to make it more comfortable if necessary.   A rehabilitation program following joint replacement surgery can speed recovery and prevent re-injury in the future due to weakened muscles. Contact your doctor or a physical therapist for more information on knee rehabilitation.    CONSTIPATION  Constipation is defined medically as fewer than three stools per week and severe constipation as less than one stool per week.  Even if you have a regular bowel pattern at home, your normal regimen is likely to be disrupted due to multiple reasons following surgery.  Combination of anesthesia, postoperative narcotics, change in appetite and fluid intake all can affect your bowels.   YOU MUST use at least one of the following options; they are listed in order of increasing strength to get the job done.  They are all available over the counter, and you may need to use some, POSSIBLY even all of these options:    Drink plenty of fluids (prune juice may be helpful) and high fiber foods Colace 100 mg by mouth twice a day  Senokot for constipation as directed and as needed Dulcolax  (bisacodyl), take with full glass of water  Miralax (polyethylene glycol) once or twice a day as needed.  If you have tried all these things and are unable to have a bowel movement in the first 3-4 days after surgery call either your surgeon or your primary doctor.    If you experience loose stools or diarrhea, hold the medications until you stool forms back up.  If your symptoms do not get better within 1 week or if they get worse, check with your doctor.  If you experience "the worst abdominal pain ever" or develop nausea or vomiting, please contact the office immediately for further recommendations for treatment.  ITCHING:  If you experience itching with your medications, try taking only a single pain pill, or even half a pain pill at a time.  You can also use Benadryl over the counter for itching or also to help with sleep.   TED HOSE STOCKINGS:  Use stockings on both legs until for at least 2 weeks or as directed by physician office. They may be removed at night for sleeping.  MEDICATIONS:  See your medication summary on the "After Visit Summary" that nursing will review with you.  You may have some home medications which will be placed on hold until you complete the course of blood thinner medication.  It is important for you to complete the blood thinner medication as prescribed.  PRECAUTIONS:  If you experience chest pain or shortness of breath - call 911 immediately for transfer to the hospital emergency department.   If you develop a fever greater that 101 F, purulent drainage from wound, increased redness or drainage from wound, foul odor from the wound/dressing, or calf pain - CONTACT YOUR SURGEON.                                                   FOLLOW-UP APPOINTMENTS:  If you do not already have a post-op appointment, please call the office for an appointment to be seen by your surgeon.  Guidelines for how soon to be seen are listed in your "After Visit Summary", but are typically  between 1-4 weeks after surgery.  OTHER INSTRUCTIONS:   Knee Replacement:  Do not place pillow under knee, focus on keeping the knee straight while resting. CPM instructions: 0-90 degrees, 2 hours in the morning, 2 hours in the afternoon, and 2 hours in the evening. Place foam block, curve side up under heel at all times except when in CPM or when walking.  DO NOT modify, tear, cut, or change the foam block in any way.  POST-OPERATIVE OPIOID TAPER INSTRUCTIONS: It is important to wean off of your opioid medication as soon as possible. If you do not need pain medication after your surgery it is ok to stop day one. Opioids include: Codeine, Hydrocodone(Norco, Vicodin), Oxycodone(Percocet, oxycontin) and hydromorphone amongst others.  Long term and even short term use of opiods can cause: Increased pain response Dependence Constipation Depression Respiratory depression And more.  Withdrawal symptoms can include Flu like symptoms Nausea, vomiting And more Techniques to manage these symptoms Hydrate well Eat regular healthy meals Stay active Use relaxation techniques(deep breathing, meditating, yoga) Do Not substitute Alcohol to help with tapering If you have been on opioids for less than two weeks and do not have pain than it is ok to stop all together.  Plan to wean off of opioids This plan should start within one week post op of your joint replacement. Maintain the same interval or time between taking each dose and first decrease the dose.  Cut the total daily intake of opioids by one tablet each day Next start to increase the time between doses. The last dose that should be eliminated is the evening dose.     MAKE SURE YOU:  Understand these instructions.  Get help right away if you are not doing well or get worse.    Thank you for letting us be a part of your medical care team.  It is a  privilege we respect greatly.  We hope these instructions will help you stay on track for  a fast and full recovery!   Discharge instructions   Complete by: As directed    INSTRUCTIONS AFTER JOINT REPLACEMENT   Remove items at home which could result in a fall. This includes throw rugs or furniture in walking pathways ICE to the affected joint every three hours while awake for 30 minutes at a time, for at least the first 3-5 days, and then as needed for pain and swelling.  Continue to use ice for pain and swelling. You may notice swelling that will progress down to the foot and ankle.  This is normal after surgery.  Elevate your leg when you are not up walking on it.   Continue to use the breathing machine you got in the hospital (incentive spirometer) which will help keep your temperature down.  It is common for your temperature to cycle up and down following surgery, especially at night when you are not up moving around and exerting yourself.  The breathing machine keeps your lungs expanded and your temperature down.   DIET:  As you were doing prior to hospitalization, we recommend a well-balanced diet.  DRESSING / WOUND CARE / SHOWERING  You may shower 3 days after surgery, but keep the wounds dry during showering.  You may use an occlusive plastic wrap (Press'n Seal for example), NO SOAKING/SUBMERGING IN THE BATHTUB.  If the bandage gets wet, change with a clean dry gauze.  If the incision gets wet, pat the wound dry with a clean towel.  ACTIVITY  Increase activity slowly as tolerated, but follow the weight bearing instructions below.   No driving for 6 weeks or until further direction given by your physician.  You cannot drive while taking narcotics.  No lifting or carrying greater than 10 lbs. until further directed by your surgeon. Avoid periods of inactivity such as sitting longer than an hour when not asleep. This helps prevent blood clots.  You may return to work once you are authorized by your doctor.     WEIGHT BEARING   Weight bearing as tolerated with assist  device (walker, cane, etc) as directed, use it as long as suggested by your surgeon or therapist, typically at least 4-6 weeks.   EXERCISES  Results after joint replacement surgery are often greatly improved when you follow the exercise, range of motion and muscle strengthening exercises prescribed by your doctor. Safety measures are also important to protect the joint from further injury. Any time any of these exercises cause you to have increased pain or swelling, decrease what you are doing until you are comfortable again and then slowly increase them. If you have problems or questions, call your caregiver or physical therapist for advice.   Rehabilitation is important following a joint replacement. After just a few days of immobilization, the muscles of the leg can become weakened and shrink (atrophy).  These exercises are designed to build up the tone and strength of the thigh and leg muscles and to improve motion. Often times heat used for twenty to thirty minutes before working out will loosen up your tissues and help with improving the range of motion but do not use heat for the first two weeks following surgery (sometimes heat can increase post-operative swelling).   These exercises can be done on a training (exercise) mat, on the floor, on a table or on a bed. Use whatever works the best and is most  comfortable for you.    Use music or television while you are exercising so that the exercises are a pleasant break in your day. This will make your life better with the exercises acting as a break in your routine that you can look forward to.   Perform all exercises about fifteen times, three times per day or as directed.  You should exercise both the operative leg and the other leg as well.  Exercises include:   Quad Sets - Tighten up the muscle on the front of the thigh (Quad) and hold for 5-10 seconds.   Straight Leg Raises - With your knee straight (if you were given a brace, keep it on),  lift the leg to 60 degrees, hold for 3 seconds, and slowly lower the leg.  Perform this exercise against resistance later as your leg gets stronger.  Leg Slides: Lying on your back, slowly slide your foot toward your buttocks, bending your knee up off the floor (only go as far as is comfortable). Then slowly slide your foot back down until your leg is flat on the floor again.  Angel Wings: Lying on your back spread your legs to the side as far apart as you can without causing discomfort.  Hamstring Strength:  Lying on your back, push your heel against the floor with your leg straight by tightening up the muscles of your buttocks.  Repeat, but this time bend your knee to a comfortable angle, and push your heel against the floor.  You may put a pillow under the heel to make it more comfortable if necessary.   A rehabilitation program following joint replacement surgery can speed recovery and prevent re-injury in the future due to weakened muscles. Contact your doctor or a physical therapist for more information on knee rehabilitation.    CONSTIPATION  Constipation is defined medically as fewer than three stools per week and severe constipation as less than one stool per week.  Even if you have a regular bowel pattern at home, your normal regimen is likely to be disrupted due to multiple reasons following surgery.  Combination of anesthesia, postoperative narcotics, change in appetite and fluid intake all can affect your bowels.   YOU MUST use at least one of the following options; they are listed in order of increasing strength to get the job done.  They are all available over the counter, and you may need to use some, POSSIBLY even all of these options:    Drink plenty of fluids (prune juice may be helpful) and high fiber foods Colace 100 mg by mouth twice a day  Senokot for constipation as directed and as needed Dulcolax (bisacodyl), take with full glass of water  Miralax (polyethylene glycol) once  or twice a day as needed.  If you have tried all these things and are unable to have a bowel movement in the first 3-4 days after surgery call either your surgeon or your primary doctor.    If you experience loose stools or diarrhea, hold the medications until you stool forms back up.  If your symptoms do not get better within 1 week or if they get worse, check with your doctor.  If you experience "the worst abdominal pain ever" or develop nausea or vomiting, please contact the office immediately for further recommendations for treatment.   ITCHING:  If you experience itching with your medications, try taking only a single pain pill, or even half a pain pill at a time.  You can  also use Benadryl over the counter for itching or also to help with sleep.   TED HOSE STOCKINGS:  Use stockings on both legs until for at least 2 weeks or as directed by physician office. They may be removed at night for sleeping.  MEDICATIONS:  See your medication summary on the "After Visit Summary" that nursing will review with you.  You may have some home medications which will be placed on hold until you complete the course of blood thinner medication.  It is important for you to complete the blood thinner medication as prescribed.  PRECAUTIONS:  If you experience chest pain or shortness of breath - call 911 immediately for transfer to the hospital emergency department.   If you develop a fever greater that 101 F, purulent drainage from wound, increased redness or drainage from wound, foul odor from the wound/dressing, or calf pain - CONTACT YOUR SURGEON.                                                   FOLLOW-UP APPOINTMENTS:  If you do not already have a post-op appointment, please call the office for an appointment to be seen by your surgeon.  Guidelines for how soon to be seen are listed in your "After Visit Summary", but are typically between 1-4 weeks after surgery.  OTHER INSTRUCTIONS:   Knee Replacement:  Do  not place pillow under knee, focus on keeping the knee straight while resting. CPM instructions: 0-90 degrees, 2 hours in the morning, 2 hours in the afternoon, and 2 hours in the evening. Place foam block, curve side up under heel at all times except when in CPM or when walking.  DO NOT modify, tear, cut, or change the foam block in any way.  POST-OPERATIVE OPIOID TAPER INSTRUCTIONS: It is important to wean off of your opioid medication as soon as possible. If you do not need pain medication after your surgery it is ok to stop day one. Opioids include: Codeine, Hydrocodone(Norco, Vicodin), Oxycodone(Percocet, oxycontin) and hydromorphone amongst others.  Long term and even short term use of opiods can cause: Increased pain response Dependence Constipation Depression Respiratory depression And more.  Withdrawal symptoms can include Flu like symptoms Nausea, vomiting And more Techniques to manage these symptoms Hydrate well Eat regular healthy meals Stay active Use relaxation techniques(deep breathing, meditating, yoga) Do Not substitute Alcohol to help with tapering If you have been on opioids for less than two weeks and do not have pain than it is ok to stop all together.  Plan to wean off of opioids This plan should start within one week post op of your joint replacement. Maintain the same interval or time between taking each dose and first decrease the dose.  Cut the total daily intake of opioids by one tablet each day Next start to increase the time between doses. The last dose that should be eliminated is the evening dose.     MAKE SURE YOU:  Understand these instructions.  Get help right away if you are not doing well or get worse.    Thank you for letting us be a part of your medical care team.  It is a privilege we respect greatly.  We hope these instructions will help you stay on track for a fast and full recovery!   Increase activity slowly as tolerated  Complete by:  As directed    Increase activity slowly as tolerated   Complete by: As directed    Increase activity slowly as tolerated   Complete by: As directed    Post-operative opioid taper instructions:   Complete by: As directed    POST-OPERATIVE OPIOID TAPER INSTRUCTIONS: It is important to wean off of your opioid medication as soon as possible. If you do not need pain medication after your surgery it is ok to stop day one. Opioids include: Codeine, Hydrocodone(Norco, Vicodin), Oxycodone(Percocet, oxycontin) and hydromorphone amongst others.  Long term and even short term use of opiods can cause: Increased pain response Dependence Constipation Depression Respiratory depression And more.  Withdrawal symptoms can include Flu like symptoms Nausea, vomiting And more Techniques to manage these symptoms Hydrate well Eat regular healthy meals Stay active Use relaxation techniques(deep breathing, meditating, yoga) Do Not substitute Alcohol to help with tapering If you have been on opioids for less than two weeks and do not have pain than it is ok to stop all together.  Plan to wean off of opioids This plan should start within one week post op of your joint replacement. Maintain the same interval or time between taking each dose and first decrease the dose.  Cut the total daily intake of opioids by one tablet each day Next start to increase the time between doses. The last dose that should be eliminated is the evening dose.      Post-operative opioid taper instructions:   Complete by: As directed    POST-OPERATIVE OPIOID TAPER INSTRUCTIONS: It is important to wean off of your opioid medication as soon as possible. If you do not need pain medication after your surgery it is ok to stop day one. Opioids include: Codeine, Hydrocodone(Norco, Vicodin), Oxycodone(Percocet, oxycontin) and hydromorphone amongst others.  Long term and even short term use of opiods can cause: Increased pain  response Dependence Constipation Depression Respiratory depression And more.  Withdrawal symptoms can include Flu like symptoms Nausea, vomiting And more Techniques to manage these symptoms Hydrate well Eat regular healthy meals Stay active Use relaxation techniques(deep breathing, meditating, yoga) Do Not substitute Alcohol to help with tapering If you have been on opioids for less than two weeks and do not have pain than it is ok to stop all together.  Plan to wean off of opioids This plan should start within one week post op of your joint replacement. Maintain the same interval or time between taking each dose and first decrease the dose.  Cut the total daily intake of opioids by one tablet each day Next start to increase the time between doses. The last dose that should be eliminated is the evening dose.      Post-operative opioid taper instructions:   Complete by: As directed    POST-OPERATIVE OPIOID TAPER INSTRUCTIONS: It is important to wean off of your opioid medication as soon as possible. If you do not need pain medication after your surgery it is ok to stop day one. Opioids include: Codeine, Hydrocodone(Norco, Vicodin), Oxycodone(Percocet, oxycontin) and hydromorphone amongst others.  Long term and even short term use of opiods can cause: Increased pain response Dependence Constipation Depression Respiratory depression And more.  Withdrawal symptoms can include Flu like symptoms Nausea, vomiting And more Techniques to manage these symptoms Hydrate well Eat regular healthy meals Stay active Use relaxation techniques(deep breathing, meditating, yoga) Do Not substitute Alcohol to help with tapering If you have been on opioids for less than two weeks  and do not have pain than it is ok to stop all together.  Plan to wean off of opioids This plan should start within one week post op of your joint replacement. Maintain the same interval or time between taking  each dose and first decrease the dose.  Cut the total daily intake of opioids by one tablet each day Next start to increase the time between doses. The last dose that should be eliminated is the evening dose.           Follow-up Information     Melrose Nakayama, MD. Schedule an appointment as soon as possible for a visit in 2 week(s).   Specialty: Orthopedic Surgery Contact information: [place] [address] [place] Follow up.   Specialty: [place] Why: to provide home physical therapy Contact information: [address] [place] [address] [phone]                  Signed: Larwance Sachs Diannia Hogenson 04/03/2022, 8:11 AM

## 2022-04-03 NOTE — Plan of Care (Signed)
Plan of care reviewed and discussed with the patient and her daughter. 

## 2022-04-03 NOTE — Plan of Care (Signed)
  Problem: Activity: Goal: Risk for activity intolerance will decrease Outcome: Progressing   

## 2022-04-03 NOTE — Progress Notes (Addendum)
Subjective: 2 Days Post-Op Procedure(s) (LRB): RIGHT TOTAL KNEE ARTHROPLASTY (Right)  Patient feeling a little better this morning. She is hoping to go home with her daughter today.  Activity level:  wbat Diet tolerance:  ok Voiding:  ok Patient reports pain as mild and moderate.    Objective: Vital signs in last 24 hours: Temp:  [97.7 F (36.5 C)-98.7 F (37.1 C)] 98.4 F (36.9 C) (06/08 0550) Pulse Rate:  [72-93] 93 (06/08 0550) Resp:  [17-18] 18 (06/08 0550) BP: (121-170)/(48-54) 154/54 (06/08 0550) SpO2:  [92 %-99 %] 92 % (06/08 0550)  Labs: No results for input(s): "HGB" in the last 72 hours. No results for input(s): "WBC", "RBC", "HCT", "PLT" in the last 72 hours. No results for input(s): "NA", "K", "CL", "CO2", "BUN", "CREATININE", "GLUCOSE", "CALCIUM" in the last 72 hours. No results for input(s): "LABPT", "INR" in the last 72 hours.  Physical Exam:  Neurologically intact ABD soft Neurovascular intact Sensation intact distally Intact pulses distally Dorsiflexion/Plantar flexion intact Incision: dressing C/D/I and no drainage No cellulitis present Compartment soft  Assessment/Plan:  2 Days Post-Op Procedure(s) (LRB): RIGHT TOTAL KNEE ARTHROPLASTY (Right) Advance diet Up with therapy Discharge home with home health today if cleared by PT and doing well. Follow up in office 2 weeks post op.  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  Came back to visit with patient at 2:15pm. She did not do well with PT this morning and became syncopal. We are going to put IV back in and give her a 59m bolus and leave her IV running. We will hold of on any narcotic pain meds and try tylenol and tramadol only for pain. Both her daughter and I are hoping that she can go home and not to SNF but we will see how she progresses. She is currently in bedside chair and is hoping to get up with PT. We will continue to follow closely.  ALarwance SachsNida 04/03/2022, 8:08 AM

## 2022-04-04 DIAGNOSIS — Z8249 Family history of ischemic heart disease and other diseases of the circulatory system: Secondary | ICD-10-CM | POA: Diagnosis not present

## 2022-04-04 DIAGNOSIS — Z881 Allergy status to other antibiotic agents status: Secondary | ICD-10-CM | POA: Diagnosis not present

## 2022-04-04 DIAGNOSIS — E785 Hyperlipidemia, unspecified: Secondary | ICD-10-CM | POA: Diagnosis present

## 2022-04-04 DIAGNOSIS — Z833 Family history of diabetes mellitus: Secondary | ICD-10-CM | POA: Diagnosis not present

## 2022-04-04 DIAGNOSIS — G8929 Other chronic pain: Secondary | ICD-10-CM | POA: Diagnosis present

## 2022-04-04 DIAGNOSIS — M25561 Pain in right knee: Secondary | ICD-10-CM | POA: Diagnosis present

## 2022-04-04 DIAGNOSIS — Z9049 Acquired absence of other specified parts of digestive tract: Secondary | ICD-10-CM | POA: Diagnosis not present

## 2022-04-04 DIAGNOSIS — Z86011 Personal history of benign neoplasm of the brain: Secondary | ICD-10-CM | POA: Diagnosis not present

## 2022-04-04 DIAGNOSIS — Z79899 Other long term (current) drug therapy: Secondary | ICD-10-CM | POA: Diagnosis not present

## 2022-04-04 DIAGNOSIS — M1711 Unilateral primary osteoarthritis, right knee: Secondary | ICD-10-CM | POA: Diagnosis present

## 2022-04-04 DIAGNOSIS — Z885 Allergy status to narcotic agent status: Secondary | ICD-10-CM | POA: Diagnosis not present

## 2022-04-04 DIAGNOSIS — I10 Essential (primary) hypertension: Secondary | ICD-10-CM | POA: Diagnosis present

## 2022-04-04 DIAGNOSIS — R5382 Chronic fatigue, unspecified: Secondary | ICD-10-CM | POA: Diagnosis present

## 2022-04-04 DIAGNOSIS — K2270 Barrett's esophagus without dysplasia: Secondary | ICD-10-CM | POA: Diagnosis present

## 2022-04-04 DIAGNOSIS — K209 Esophagitis, unspecified without bleeding: Secondary | ICD-10-CM | POA: Diagnosis present

## 2022-04-04 DIAGNOSIS — Z9882 Breast implant status: Secondary | ICD-10-CM | POA: Diagnosis not present

## 2022-04-04 DIAGNOSIS — E119 Type 2 diabetes mellitus without complications: Secondary | ICD-10-CM | POA: Diagnosis present

## 2022-04-04 DIAGNOSIS — M797 Fibromyalgia: Secondary | ICD-10-CM | POA: Diagnosis present

## 2022-04-04 DIAGNOSIS — Z888 Allergy status to other drugs, medicaments and biological substances status: Secondary | ICD-10-CM | POA: Diagnosis not present

## 2022-04-04 MED ORDER — METHOCARBAMOL 500 MG PO TABS
500.0000 mg | ORAL_TABLET | Freq: Four times a day (QID) | ORAL | Status: DC | PRN
  Administered 2022-04-04 – 2022-04-05 (×2): 500 mg via ORAL
  Filled 2022-04-04 (×2): qty 1

## 2022-04-04 MED ORDER — METHOCARBAMOL 500 MG PO TABS: 500.0000 mg | ORAL_TABLET | Freq: Four times a day (QID) | ORAL | 0 refills | Status: DC | PRN

## 2022-04-04 MED ORDER — TRAMADOL HCL 50 MG PO TABS
50.0000 mg | ORAL_TABLET | Freq: Four times a day (QID) | ORAL | Status: DC | PRN
  Administered 2022-04-04: 100 mg via ORAL
  Administered 2022-04-04 – 2022-04-05 (×4): 50 mg via ORAL
  Filled 2022-04-04: qty 1
  Filled 2022-04-04: qty 2
  Filled 2022-04-04 (×3): qty 1

## 2022-04-04 MED ORDER — TRAMADOL HCL 50 MG PO TABS: 50.0000 mg | ORAL_TABLET | Freq: Four times a day (QID) | ORAL | 0 refills | Status: DC | PRN

## 2022-04-04 NOTE — Progress Notes (Signed)
Physical Therapy Treatment Patient Details Name: Jennifer Melendez MRN: [id number] DOB: 1941/04/10 Today's Date: 04/04/2022   History of Present Illness Pt is an 81yo female presenting s/p R-TKA on 04/01/22. PMH: hx of cervical caner, fibromyalgia, HLD, HTN, PVC, s/p brain surgery meningioma, back surgery L5.    PT Comments    Pt made progress this session with MAX encouragement. Pt required mod assist to power up and prevent posterior LOB. Pt has tendency to lean posteriorly and drop into crouched position impulsively due to pain. Manual cue/assist needed to weight shift forward and repeated verbal cues for upright posture during gait. PT ambulated ~45' with multiple standing rest breaks and Mod Assist to stabilize RW and VC's to continue ambulating. EOS returned pt to bed and reviewed HEP for ROM and circulation. Will continue to progress as able. Pt needs to be able to ambulated functional distance and ascend 6-8 stairs with single rail t safely return home.   Recommendations for follow up therapy are one component of a multi-disciplinary discharge planning process, led by the attending physician.  Recommendations may be updated based on patient status, additional functional criteria and insurance authorization.  Follow Up Recommendations  Follow physician's recommendations for discharge plan and follow up therapies     Assistance Recommended at Discharge Frequent or constant Supervision/Assistance  Patient can return home with the following A lot of help with walking and/or transfers;A lot of help with bathing/dressing/bathroom;Assistance with cooking/housework;Direct supervision/assist for medications management;Assist for transportation;Help with stairs or ramp for entrance   Equipment Recommendations  Rolling walker (2 wheels)    Recommendations for Other Services       Precautions / Restrictions Precautions Precautions: Fall;Knee Precaution Booklet Issued: No Restrictions Weight  Bearing Restrictions: No RLE Weight Bearing: Weight bearing as tolerated     Mobility  Bed Mobility Overal bed mobility: Needs Assistance Bed Mobility: Sit to Supine       Sit to supine: Min assist   General bed mobility comments: VC for use of belt to assist Rt LE onto bed, Min assist needed to fully raise bil LE's onto bed. pt able to use ibl UE and Lt LE to scoot self superior in bed with head board.    Transfers Overall transfer level: Needs assistance Equipment used: Rolling walker (2 wheels) Transfers: Sit to/from Stand Sit to Stand: Mod assist           General transfer comment: VC's for pt to initiate with bil UE from armrest to power up. guarding on initial stand and pt unable to rise fully with posterior LOB. Mod assist to fully rise to walker and facilitate upright posture.    Ambulation/Gait Ambulation/Gait assistance: Min assist, Mod assist Gait Distance (Feet): 45 Feet Assistive device: Rolling walker (2 wheels) Gait Pattern/deviations: Step-to pattern, Decreased step length - right, Decreased step length - left, Decreased stride length, Antalgic, Trunk flexed Gait velocity: decr     General Gait Details: pt able to progress to   Stairs             Wheelchair Mobility    Modified Rankin (Stroke Patients Only)       Balance Overall balance assessment: Needs assistance Sitting-balance support: Bilateral upper extremity supported, Feet supported Sitting balance-Leahy Scale: Fair Sitting balance - Comments: Pt used Bil UE for sitting balance support. Postural control: Other (comment) (Trunk flexed) Standing balance support: Bilateral upper extremity supported, Reliant on assistive device for balance Standing balance-Leahy Scale: Fair Standing balance comment: Pt reported dizziness, "  I feel like I'm about to pass out" during standing. BP was taken 3x throughout transfers, systolic drop >33 mmHg while in sitting, increased back to 117/** for last  assessment.                            Cognition Arousal/Alertness: Awake/alert Behavior During Therapy: Anxious Overall Cognitive Status: Within Functional Limits for tasks assessed Area of Impairment: Problem solving, Following commands, Safety/judgement                       Following Commands: Follows one step commands consistently, Follows multi-step commands inconsistently, Follows one step commands with increased time, Follows multi-step commands with increased time Safety/Judgement: Decreased awareness of safety   Problem Solving: Requires verbal cues, Requires tactile cues, Difficulty sequencing, Decreased initiation, Slow processing General Comments: Pt reluctant to move esp when pain increased after standing. requires Max and stern/kind encouragement.        Exercises Total Joint Exercises Ankle Circles/Pumps: AROM, Both, 20 reps, Seated Quad Sets: AROM, Both, 10 reps (towel under Rt heel) Heel Slides: Right, 10 reps, AROM (tray under heel) Hip ABduction/ADduction: AAROM, Right, 10 reps    General Comments General comments (skin integrity, edema, etc.): Daughter was present during session.      Pertinent Vitals/Pain Pain Assessment Pain Assessment: 0-10 Pain Score: 5  Pain Location: right knee Pain Descriptors / Indicators: Discomfort, Guarding, Operative site guarding, Tender Pain Intervention(s): Limited activity within patient's tolerance, Monitored during session, Repositioned, Ice applied    Home Living                          Prior Function            PT Goals (current goals can now be found in the care plan section) Acute Rehab PT Goals Patient Stated Goal: To go home and be without pain PT Goal Formulation: With patient/family Time For Goal Achievement: 04/08/22 Potential to Achieve Goals: Good Progress towards PT goals: Progressing toward goals    Frequency    7X/week      PT Plan Current plan remains  appropriate    Co-evaluation              AM-PAC PT "6 Clicks" Mobility   Outcome Measure  Help needed turning from your back to your side while in a flat bed without using bedrails?: None Help needed moving from lying on your back to sitting on the side of a flat bed without using bedrails?: A Little Help needed moving to and from a bed to a chair (including a wheelchair)?: A Little Help needed standing up from a chair using your arms (e.g., wheelchair or bedside chair)?: A Lot Help needed to walk in hospital room?: A Lot Help needed climbing 3-5 steps with a railing? : A Lot 6 Click Score: 16    End of Session Equipment Utilized During Treatment: Gait belt Activity Tolerance: Patient limited by pain Patient left: in chair;with call bell/phone within reach;with chair alarm set;with family/visitor present Nurse Communication: Mobility status PT Visit Diagnosis: Unsteadiness on feet (R26.81);Other abnormalities of gait and mobility (R26.89);Repeated falls (R29.6);Pain Pain - Right/Left: Right Pain - part of body: Knee     Time: 0076-2263 PT Time Calculation (min) (ACUTE ONLY): 34 min  Charges:  $Gait Training: 8-22 mins $Therapeutic Exercise: 8-22 mins  Verner Mould, DPT Acute Rehabilitation Services Office [phone] Pager [phone]  04/04/22 1:46 PM

## 2022-04-04 NOTE — Plan of Care (Signed)

## 2022-04-04 NOTE — Progress Notes (Signed)
    Patient doing well this morning, accompanied by her daughter.  She feels much more alert and much improved being off the narcotic pain medications.  She has been taking tramadol only.  She states it does help with her pain but she still has quite a bit of pain.  She is hoping to progress with physical therapy today in order to be able to progress home.  Her largest concern is being able to get to the bathroom on her own.  She has not yet had physical therapy today.  She is eating and drinking and denies any nausea now that she is off the pain medication.   Physical Exam: Vitals:   04/04/22 0535 04/04/22 0828  BP:  (!) 149/61  Pulse: (!) 101 87  Resp:    Temp:    SpO2: 94%     Dressing in place, clean dry and intact.  Patient resting comfortably in bed.  Distal compartments soft.  Normal mood and affect, NVI  POD #3 status post right total knee replacement by Dr. Rhona Raider.  Slow progress secondary to side effects from narcotic medication the first couple days.  Patient has not yet cleared physical therapy to progress home with her daughter  - up with PT/OT, encourage ambulation - daughter has various questions about when they will receive the equipment for home from therapy - Plan is to proceed with 1-2 physical therapy visits today in order to discharge home today to her daughter's house - tramadol for pain, Robaxin for muscle spasms  -we did increase tramadol to 1-2 tablets as needed every 6 hours - likely d/c home today with f/u in 2 weeks

## 2022-04-04 NOTE — Progress Notes (Signed)
Physical Therapy Treatment Patient Details Name: Jennifer Melendez MRN: [id number] DOB: 08-05-1941 Today's Date: 04/04/2022   History of Present Illness Pt is an 81yo female presenting s/p R-TKA on 04/01/22. PMH: hx of cervical caner, fibromyalgia, HLD, HTN, PVC, s/p brain surgery meningioma, back surgery L5.    PT Comments    Pt continues therapy with balance and mobility deficits d/t above HPI. Pt required Min assist for bed mobility and Mod Assist for transfers. Pt reported feeling dizzy during standing, blood pressure was taken indicating drop in systolic pressure. BP recovered after sitting.  Pt unable to ambulate and attempt stairs. Pt completed HEP including heel slides, SAQ, and ankle pumps. Recommending continued skilled therapy and intend to complete afternoon session today with pt. Will progress as able.   Recommendations for follow up therapy are one component of a multi-disciplinary discharge planning process, led by the attending physician.  Recommendations may be updated based on patient status, additional functional criteria and insurance authorization.  Follow Up Recommendations  Follow physician's recommendations for discharge plan and follow up therapies     Assistance Recommended at Discharge Frequent or constant Supervision/Assistance  Patient can return home with the following A lot of help with walking and/or transfers;A lot of help with bathing/dressing/bathroom;Assistance with cooking/housework;Direct supervision/assist for medications management;Assist for transportation;Help with stairs or ramp for entrance   Equipment Recommendations  Rolling walker (2 wheels)    Recommendations for Other Services       Precautions / Restrictions Precautions Precautions: Fall;Knee Precaution Booklet Issued: No Restrictions Weight Bearing Restrictions: No RLE Weight Bearing: Weight bearing as tolerated     Mobility  Bed Mobility Overal bed mobility: Needs Assistance Bed  Mobility: Supine to Sit     Supine to sit: Min assist     General bed mobility comments: Pt required repeated verbal cues, therapist assist for legs off EOB. Pt relied on Bil UE support for sitting.    Transfers Overall transfer level: Needs assistance Equipment used: Rolling walker (2 wheels) Transfers: Sit to/from Stand Sit to Stand: Mod assist Stand pivot transfers: Mod assist         General transfer comment: Therapist guarded pt's Rt knee during sit to stand to prevent buckling, pt required VC's for RW and hand placement.    Ambulation/Gait                   Stairs             Wheelchair Mobility    Modified Rankin (Stroke Patients Only)       Balance Overall balance assessment: Needs assistance Sitting-balance support: Bilateral upper extremity supported, Feet supported Sitting balance-Leahy Scale: Fair Sitting balance - Comments: Pt used Bil UE for sitting balance support. Postural control: Other (comment) (Trunk flexed) Standing balance support: Bilateral upper extremity supported, Reliant on assistive device for balance Standing balance-Leahy Scale: Fair Standing balance comment: Pt reported dizziness, "I feel like I'm about to pass out" during standing. BP was taken 3x throughout transfers, systolic drop >13 mmHg while in sitting, increased back to 117/** for last assessment.                            Cognition Arousal/Alertness: Awake/alert Behavior During Therapy: Anxious Overall Cognitive Status: Impaired/Different from baseline Area of Impairment: Attention, Problem solving, Following commands                   Current Attention Level: Selective  Following Commands: Follows one step commands consistently, Follows multi-step commands inconsistently   Awareness: Anticipatory Problem Solving: Requires verbal cues, Requires tactile cues, Difficulty sequencing, Decreased initiation, Slow processing General Comments:  Pt reluctant to move esp when pain increased after standing.        Exercises Total Joint Exercises Ankle Circles/Pumps: AROM, Both, 20 reps, Seated Short Arc Quad: AROM, Strengthening, Right, 10 reps, Seated Heel Slides: AAROM, Right, 10 reps, Seated (pt completed 2 sets of 5 reps=10)    General Comments General comments (skin integrity, edema, etc.): Daughter was present during session.      Pertinent Vitals/Pain Pain Assessment Pain Assessment: 0-10 Pain Score: 5  Pain Location: right knee Pain Descriptors / Indicators: Discomfort, Guarding, Operative site guarding, Tender Pain Intervention(s): Limited activity within patient's tolerance, Monitored during session, Repositioned, Ice applied    Home Living                          Prior Function            PT Goals (current goals can now be found in the care plan section) Acute Rehab PT Goals Patient Stated Goal: To go home and be without pain PT Goal Formulation: With patient/family Time For Goal Achievement: 04/08/22 Potential to Achieve Goals: Good Progress towards PT goals: Not progressing toward goals - comment (Pt unable to ambulate and complete stairs.)    Frequency    7X/week      PT Plan Current plan remains appropriate    Co-evaluation              AM-PAC PT "6 Clicks" Mobility   Outcome Measure  Help needed turning from your back to your side while in a flat bed without using bedrails?: None Help needed moving from lying on your back to sitting on the side of a flat bed without using bedrails?: A Little Help needed moving to and from a bed to a chair (including a wheelchair)?: A Little Help needed standing up from a chair using your arms (e.g., wheelchair or bedside chair)?: A Lot Help needed to walk in hospital room?: A Lot Help needed climbing 3-5 steps with a railing? : A Lot 6 Click Score: 16    End of Session Equipment Utilized During Treatment: Gait belt Activity  Tolerance: Patient limited by pain Patient left: in chair;with call bell/phone within reach;with chair alarm set;with family/visitor present Nurse Communication: Mobility status PT Visit Diagnosis: Unsteadiness on feet (R26.81);Other abnormalities of gait and mobility (R26.89);Repeated falls (R29.6);Pain Pain - Right/Left: Right Pain - part of body: Knee     Time:  -     Charges:                        Margie Ege, SPT [place] 04/04/2022, 10:45 AM

## 2022-04-05 NOTE — Plan of Care (Signed)
  Problem: Education: Goal: Knowledge of the prescribed therapeutic regimen will improve Outcome: Progressing   Problem: Activity: Goal: Ability to avoid complications of mobility impairment will improve Outcome: Progressing Goal: Range of joint motion will improve Outcome: Progressing   Problem: Clinical Measurements: Goal: Postoperative complications will be avoided or minimized Outcome: Progressing   Problem: Pain Management: Goal: Pain level will decrease with appropriate interventions Outcome: Progressing   Problem: Skin Integrity: Goal: Will show signs of wound healing Outcome: Progressing   Problem: Education: Goal: Knowledge of General Education information will improve Description: Including pain rating scale, medication(s)/side effects and non-pharmacologic comfort measures Outcome: Progressing   Problem: Health Behavior/Discharge Planning: Goal: Ability to manage health-related needs will improve Outcome: Progressing   Problem: Clinical Measurements: Goal: Ability to maintain clinical measurements within normal limits will improve Outcome: Progressing Goal: Will remain free from infection Outcome: Progressing Goal: Diagnostic test results will improve Outcome: Progressing Goal: Respiratory complications will improve Outcome: Progressing Goal: Cardiovascular complication will be avoided Outcome: Progressing   Problem: Activity: Goal: Risk for activity intolerance will decrease Outcome: Progressing   Problem: Nutrition: Goal: Adequate nutrition will be maintained Outcome: Progressing   Problem: Coping: Goal: Level of anxiety will decrease Outcome: Progressing   Problem: Elimination: Goal: Will not experience complications related to bowel motility Outcome: Progressing Goal: Will not experience complications related to urinary retention Outcome: Progressing   Problem: Pain Managment: Goal: General experience of comfort will improve Outcome:  Progressing   Problem: Safety: Goal: Ability to remain free from injury will improve Outcome: Progressing   Problem: Skin Integrity: Goal: Risk for impaired skin integrity will decrease Outcome: Progressing

## 2022-04-05 NOTE — Progress Notes (Signed)
Pt alert and oriented. Surgical site clean dry and intact. No questions or queries regarding discharge instructions. Belongings sent back with patient.

## 2022-04-05 NOTE — Progress Notes (Signed)
Physical Therapy Treatment Patient Details Name: Jennifer Melendez MRN: [id number] DOB: Mar 20, 1941 Today's Date: 04/05/2022   History of Present Illness Pt is an 81yo female presenting s/p R-TKA on 04/01/22. PMH: hx of cervical caner, fibromyalgia, HLD, HTN, PVC, s/p brain surgery meningioma, back surgery L5.    PT Comments    Patient demonstrated good recall for safe use of RW with transfers and gait. Daughter present and provided guarding with cues and supervision from PT/SPT during gait and stair mobility. No LOB noted and reviewed importance of ankle pumps and HEP completion at home. Discussed technique for car transfer at EOS and addressed questions for discharge. Pt is mobilizing at safe level for discharge home with assist from family. Will progress during acute stay.    Recommendations for follow up therapy are one component of a multi-disciplinary discharge planning process, led by the attending physician.  Recommendations may be updated based on patient status, additional functional criteria and insurance authorization.  Follow Up Recommendations  Follow physician's recommendations for discharge plan and follow up therapies     Assistance Recommended at Discharge Frequent or constant Supervision/Assistance  Patient can return home with the following A lot of help with walking and/or transfers;A lot of help with bathing/dressing/bathroom;Assistance with cooking/housework;Direct supervision/assist for medications management;Assist for transportation;Help with stairs or ramp for entrance   Equipment Recommendations  Rolling walker (2 wheels)    Recommendations for Other Services       Precautions / Restrictions Precautions Precautions: Fall;Knee Precaution Booklet Issued: No Restrictions Weight Bearing Restrictions: No RLE Weight Bearing: Weight bearing as tolerated     Mobility  Bed Mobility               General bed mobility comments: pt OOB in recliner     Transfers Overall transfer level: Needs assistance Equipment used: Rolling walker (2 wheels) Transfers: Sit to/from Stand Sit to Stand: Min guard, Supervision           General transfer comment: Patient demonstrates good hand placement with RW for power up from toilet. safe reach back to sit in recliner.    Ambulation/Gait Ambulation/Gait assistance: Min guard, Supervision Gait Distance (Feet): 40 Feet Assistive device: Rolling walker (2 wheels) Gait Pattern/deviations: Step-to pattern, Decreased step length - left, Decreased stride length, Antalgic, Trunk flexed, Decreased stance time - right, Decreased weight shift to right Gait velocity: decr     General Gait Details: pt's daughter present and provided safe guarding with cues/supervision from therapist to complete steps.   Stairs Stairs: Yes Stairs assistance: Min assist, Min guard Stair Management: One rail Left, Step to pattern, Forwards, Sideways Number of Stairs: 6 General stair comments: pt wtih good recall for sequence "up with good, down with bad" no overt LOB noted. pt required cues for hand placmenet and daughter provided safe gaurding with cues and supervision from therapist.   Wheelchair Mobility    Modified Rankin (Stroke Patients Only)       Balance Overall balance assessment: Needs assistance Sitting-balance support: Bilateral upper extremity supported, Feet supported Sitting balance-Leahy Scale: Fair   Postural control: Other (comment) (Trunk flexed) Standing balance support: Bilateral upper extremity supported, Reliant on assistive device for balance Standing balance-Leahy Scale: Fair                              Cognition Arousal/Alertness: Awake/alert Behavior During Therapy: WFL for tasks assessed/performed Overall Cognitive Status: Within Functional Limits for tasks assessed  General Comments: pt's attitude improving         Exercises Total Joint Exercises Ankle Circles/Pumps: AROM, Both, 20 reps, Seated Quad Sets: AROM, Both, 10 reps Heel Slides: Right, 10 reps, AAROM (tray under heel)    General Comments        Pertinent Vitals/Pain Pain Assessment Pain Assessment: 0-10 Pain Score: 6  Pain Location: right knee Pain Descriptors / Indicators: Discomfort, Guarding, Operative site guarding, Tender Pain Intervention(s): Limited activity within patient's tolerance, Monitored during session, Repositioned, Premedicated before session, Ice applied    Home Living                          Prior Function            PT Goals (current goals can now be found in the care plan section) Acute Rehab PT Goals Patient Stated Goal: To go home and be without pain PT Goal Formulation: With patient/family Time For Goal Achievement: 04/08/22 Potential to Achieve Goals: Good Progress towards PT goals: Progressing toward goals    Frequency    7X/week      PT Plan Current plan remains appropriate    Co-evaluation              AM-PAC PT "6 Clicks" Mobility   Outcome Measure  Help needed turning from your back to your side while in a flat bed without using bedrails?: None Help needed moving from lying on your back to sitting on the side of a flat bed without using bedrails?: A Little Help needed moving to and from a bed to a chair (including a wheelchair)?: A Little Help needed standing up from a chair using your arms (e.g., wheelchair or bedside chair)?: A Little Help needed to walk in hospital room?: A Little Help needed climbing 3-5 steps with a railing? : A Little 6 Click Score: 19    End of Session Equipment Utilized During Treatment: Gait belt Activity Tolerance: Patient limited by pain Patient left: in chair;with call bell/phone within reach;with chair alarm set;with family/visitor present Nurse Communication: Mobility status PT Visit Diagnosis: Unsteadiness on feet (R26.81);Other  abnormalities of gait and mobility (R26.89);Repeated falls (R29.6);Pain Pain - Right/Left: Right Pain - part of body: Knee     Time: 1428-1450 PT Time Calculation (min) (ACUTE ONLY): 22 min  Charges:  $Gait Training: 8-22 mins                     Verner Mould, DPT Acute Rehabilitation Services Office [phone] Pager [phone]  04/05/22 3:29 PM

## 2022-04-05 NOTE — Progress Notes (Signed)
    Patient doing well today accompanied by her daughter.  She feels much more alert and much improved being off the narcotic pain medications.  She has been taking tramadol only and has well controlled pain with increase in dosing.  She has made good progress with PT x2 today and has been cleared to D/C home. She is awaiting her blue foam for her knee exercises before D/C     Physical Exam: BP 131/80 (BP Location: Right Arm)   Pulse 81   Temp 98.2 F (36.8 C) (Oral)   Resp 17   Ht 5' 7.5" (1.715 m)   Wt 82.1 kg   SpO2 99%   BMI 27.93 kg/m     Dressing in place, clean dry and intact.  Patient resting comfortably in chair.  Distal compartments soft.  Normal mood and affect, NVI   POD #4 status post right total knee replacement by Dr. Rhona Raider.  Slow progress secondary to side effects from narcotic medication the first couple days.  Patient has cleared physical therapy to progress home with her daughter   - tramadol for pain, Robaxin for muscle spasms             -we did increase tramadol to 1-2 tablets as needed every 6 hours - likely d/c home today with f/u in 2 weeks  - Cont DVT prophylaxis - F/U in 2 weeks Dr Rhona Raider

## 2022-04-05 NOTE — Progress Notes (Signed)
Orthopedic Tech Progress Note Patient Details:  Jennifer Melendez 1940-11-20 [id number]  Ortho Devices Type of Ortho Device: Bone foam zero knee Ortho Device/Splint Location: right Ortho Device/Splint Interventions: Ordered, Application, Adjustment   Post Interventions Patient Tolerated: Well Instructions Provided: Care of device  [place] Pink 04/05/2022, 3:36 PM

## 2022-04-05 NOTE — Progress Notes (Signed)
Physical Therapy Treatment Patient Details Name: Jennifer Melendez MRN: [id number] DOB: July 13, 1941 Today's Date: 04/05/2022   History of Present Illness Pt is an 81yo female presenting s/p R-TKA on 04/01/22. PMH: hx of cervical caner, fibromyalgia, HLD, HTN, PVC, s/p brain surgery meningioma, back surgery L5.    PT Comments    Patient making steady progress with mobility and demonstrated some carryover for safe technique to transfer and manage RW during gait. Pt required min assist for sit<>stand and ~45' of ambulation, no LOB noted. Pt initiated stair mobility with single rail for support and VC's for safe step pattern. EOS reviewed HEP for ROM and circulation. Will continue to progress mobility as able in preparation for safe discharge.   Recommendations for follow up therapy are one component of a multi-disciplinary discharge planning process, led by the attending physician.  Recommendations may be updated based on patient status, additional functional criteria and insurance authorization.  Follow Up Recommendations  Follow physician's recommendations for discharge plan and follow up therapies     Assistance Recommended at Discharge Frequent or constant Supervision/Assistance  Patient can return home with the following A lot of help with walking and/or transfers;A lot of help with bathing/dressing/bathroom;Assistance with cooking/housework;Direct supervision/assist for medications management;Assist for transportation;Help with stairs or ramp for entrance   Equipment Recommendations  Rolling walker (2 wheels)    Recommendations for Other Services       Precautions / Restrictions Precautions Precautions: Fall;Knee Precaution Booklet Issued: No Restrictions Weight Bearing Restrictions: No RLE Weight Bearing: Weight bearing as tolerated     Mobility  Bed Mobility               General bed mobility comments: pt OOB in recliner    Transfers Overall transfer level: Needs  assistance Equipment used: Rolling walker (2 wheels) Transfers: Sit to/from Stand Sit to Stand: Min guard, Min assist           General transfer comment: pt attempted rise with hands on RW and then adjusted to power up with bil UE from armrests. light assist to steady with hand transition and rise to RW. guarding for safety with pt controlling lowering to sit wiht bil UE. good recall to extend Rt knee with sitting.    Ambulation/Gait Ambulation/Gait assistance: Min assist Gait Distance (Feet): 45 Feet Assistive device: Rolling walker (2 wheels) Gait Pattern/deviations: Step-to pattern, Decreased step length - left, Decreased stride length, Antalgic, Trunk flexed, Decreased stance time - right, Decreased weight shift to right Gait velocity: decr     General Gait Details: Pt demonstrated good recall for safe proximity to RW and improved safety with no impulsive drops to RW due to pain. Pt able to take standing rest breaks with VCs to pause for pain management. no buckling at Rt knee. light assist and min cues to guide gait distance, direction, and pace.   Stairs Stairs: Yes Stairs assistance: Min assist Stair Management: One rail Left, Step to pattern, Forwards, Sideways Number of Stairs: 3 General stair comments: cues for sequencing "up with good,down with bad" no buckling at Rt knee. cues for sbil UE support on single rail. min assist to initiate first step, pt improved effort on 2nd/3rd step.   Wheelchair Mobility    Modified Rankin (Stroke Patients Only)       Balance Overall balance assessment: Needs assistance Sitting-balance support: Bilateral upper extremity supported, Feet supported Sitting balance-Leahy Scale: Fair   Postural control: Other (comment) (Trunk flexed) Standing balance support: Bilateral upper extremity supported,  Reliant on assistive device for balance Standing balance-Leahy Scale: Fair                              Cognition  Arousal/Alertness: Awake/alert Behavior During Therapy: WFL for tasks assessed/performed Overall Cognitive Status: Within Functional Limits for tasks assessed                                 General Comments: pt's attitude improving        Exercises Total Joint Exercises Ankle Circles/Pumps: AROM, Both, 20 reps, Seated Quad Sets: AROM, Both, 10 reps Heel Slides: Right, 10 reps, AAROM (tray under heel)    General Comments        Pertinent Vitals/Pain Pain Assessment Pain Assessment: 0-10 Pain Score: 5  Pain Location: right knee Pain Descriptors / Indicators: Discomfort, Guarding, Operative site guarding, Tender Pain Intervention(s): Limited activity within patient's tolerance, Monitored during session, Repositioned, Premedicated before session, Ice applied    Home Living                          Prior Function            PT Goals (current goals can now be found in the care plan section) Acute Rehab PT Goals Patient Stated Goal: To go home and be without pain PT Goal Formulation: With patient/family Time For Goal Achievement: 04/08/22 Potential to Achieve Goals: Good Progress towards PT goals: Progressing toward goals    Frequency    7X/week      PT Plan Current plan remains appropriate    Co-evaluation              AM-PAC PT "6 Clicks" Mobility   Outcome Measure  Help needed turning from your back to your side while in a flat bed without using bedrails?: None Help needed moving from lying on your back to sitting on the side of a flat bed without using bedrails?: A Little Help needed moving to and from a bed to a chair (including a wheelchair)?: A Little Help needed standing up from a chair using your arms (e.g., wheelchair or bedside chair)?: A Little Help needed to walk in hospital room?: A Little Help needed climbing 3-5 steps with a railing? : A Little 6 Click Score: 19    End of Session Equipment Utilized During  Treatment: Gait belt Activity Tolerance: Patient limited by pain Patient left: in chair;with call bell/phone within reach;with chair alarm set;with family/visitor present Nurse Communication: Mobility status PT Visit Diagnosis: Unsteadiness on feet (R26.81);Other abnormalities of gait and mobility (R26.89);Repeated falls (R29.6);Pain Pain - Right/Left: Right Pain - part of body: Knee     Time: 1761-6073 PT Time Calculation (min) (ACUTE ONLY): 28 min  Charges:  $Gait Training: 8-22 mins $Therapeutic Exercise: 8-22 mins                     Verner Mould, DPT Acute Rehabilitation Services Office [phone] Pager [phone]  04/05/22 10:32 AM

## 2022-04-05 NOTE — Plan of Care (Signed)

## 2022-04-11 ENCOUNTER — Other Ambulatory Visit: Payer: Self-pay

## 2022-04-11 ENCOUNTER — Encounter (HOSPITAL_COMMUNITY): Payer: Self-pay

## 2022-04-11 ENCOUNTER — Emergency Department (HOSPITAL_COMMUNITY): Payer: Medicare Other

## 2022-04-11 ENCOUNTER — Inpatient Hospital Stay (HOSPITAL_COMMUNITY)
Admission: EM | Admit: 2022-04-11 | Discharge: 2022-04-14 | DRG: 392 | Disposition: A | Discharge status: Home Health | Payer: Medicare Other | Attending: Internal Medicine | Admitting: Internal Medicine

## 2022-04-11 DIAGNOSIS — T474X5A Adverse effect of other laxatives, initial encounter: Secondary | ICD-10-CM | POA: Diagnosis present

## 2022-04-11 DIAGNOSIS — Z96651 Presence of right artificial knee joint: Secondary | ICD-10-CM | POA: Diagnosis present

## 2022-04-11 DIAGNOSIS — Z8249 Family history of ischemic heart disease and other diseases of the circulatory system: Secondary | ICD-10-CM

## 2022-04-11 DIAGNOSIS — K219 Gastro-esophageal reflux disease without esophagitis: Secondary | ICD-10-CM | POA: Diagnosis not present

## 2022-04-11 DIAGNOSIS — E860 Dehydration: Secondary | ICD-10-CM | POA: Diagnosis not present

## 2022-04-11 DIAGNOSIS — Z79899 Other long term (current) drug therapy: Secondary | ICD-10-CM

## 2022-04-11 DIAGNOSIS — M797 Fibromyalgia: Secondary | ICD-10-CM | POA: Diagnosis present

## 2022-04-11 DIAGNOSIS — D72829 Elevated white blood cell count, unspecified: Secondary | ICD-10-CM | POA: Diagnosis present

## 2022-04-11 DIAGNOSIS — Z9882 Breast implant status: Secondary | ICD-10-CM

## 2022-04-11 DIAGNOSIS — R42 Dizziness and giddiness: Secondary | ICD-10-CM | POA: Diagnosis present

## 2022-04-11 DIAGNOSIS — R197 Diarrhea, unspecified: Secondary | ICD-10-CM | POA: Diagnosis not present

## 2022-04-11 DIAGNOSIS — Z86011 Personal history of benign neoplasm of the brain: Secondary | ICD-10-CM

## 2022-04-11 DIAGNOSIS — Z8541 Personal history of malignant neoplasm of cervix uteri: Secondary | ICD-10-CM

## 2022-04-11 DIAGNOSIS — I10 Essential (primary) hypertension: Secondary | ICD-10-CM | POA: Diagnosis not present

## 2022-04-11 DIAGNOSIS — Y92009 Unspecified place in unspecified non-institutional (private) residence as the place of occurrence of the external cause: Secondary | ICD-10-CM

## 2022-04-11 DIAGNOSIS — Z7982 Long term (current) use of aspirin: Secondary | ICD-10-CM

## 2022-04-11 DIAGNOSIS — Z881 Allergy status to other antibiotic agents status: Secondary | ICD-10-CM

## 2022-04-11 DIAGNOSIS — Z833 Family history of diabetes mellitus: Secondary | ICD-10-CM

## 2022-04-11 DIAGNOSIS — R55 Syncope and collapse: Secondary | ICD-10-CM | POA: Diagnosis present

## 2022-04-11 DIAGNOSIS — E785 Hyperlipidemia, unspecified: Secondary | ICD-10-CM | POA: Diagnosis present

## 2022-04-11 DIAGNOSIS — E869 Volume depletion, unspecified: Secondary | ICD-10-CM | POA: Diagnosis present

## 2022-04-11 DIAGNOSIS — J811 Chronic pulmonary edema: Secondary | ICD-10-CM | POA: Diagnosis present

## 2022-04-11 DIAGNOSIS — Z801 Family history of malignant neoplasm of trachea, bronchus and lung: Secondary | ICD-10-CM

## 2022-04-11 DIAGNOSIS — I471 Supraventricular tachycardia: Secondary | ICD-10-CM | POA: Diagnosis not present

## 2022-04-11 DIAGNOSIS — J810 Acute pulmonary edema: Secondary | ICD-10-CM

## 2022-04-11 DIAGNOSIS — Z9049 Acquired absence of other specified parts of digestive tract: Secondary | ICD-10-CM

## 2022-04-11 DIAGNOSIS — Z806 Family history of leukemia: Secondary | ICD-10-CM

## 2022-04-11 DIAGNOSIS — Z885 Allergy status to narcotic agent status: Secondary | ICD-10-CM

## 2022-04-11 LAB — CBC WITH DIFFERENTIAL/PLATELET
Abs Immature Granulocytes: 0.2 10*3/uL — ABNORMAL HIGH (ref 0.00–0.07)
Basophils Absolute: 0.1 10*3/uL (ref 0.0–0.1)
Basophils Relative: 0 %
Eosinophils Absolute: 0.1 10*3/uL (ref 0.0–0.5)
Eosinophils Relative: 1 %
HCT: 37.3 % (ref 36.0–46.0)
Hemoglobin: 12.1 g/dL (ref 12.0–15.0)
Immature Granulocytes: 2 %
Lymphocytes Relative: 12 %
Lymphs Abs: 1.5 10*3/uL (ref 0.7–4.0)
MCH: 30.9 pg (ref 26.0–34.0)
MCHC: 32.4 g/dL (ref 30.0–36.0)
MCV: 95.2 fL (ref 80.0–100.0)
Monocytes Absolute: 0.9 10*3/uL (ref 0.1–1.0)
Monocytes Relative: 7 %
Neutro Abs: 9.6 10*3/uL — ABNORMAL HIGH (ref 1.7–7.7)
Neutrophils Relative %: 78 %
Platelets: 421 10*3/uL — ABNORMAL HIGH (ref 150–400)
RBC: 3.92 MIL/uL (ref 3.87–5.11)
RDW: 14.2 % (ref 11.5–15.5)
WBC: 12.3 10*3/uL — ABNORMAL HIGH (ref 4.0–10.5)
nRBC: 0 % (ref 0.0–0.2)

## 2022-04-11 LAB — COMPREHENSIVE METABOLIC PANEL
ALT: 12 U/L (ref 0–44)
AST: 17 U/L (ref 15–41)
Albumin: 3.1 g/dL — ABNORMAL LOW (ref 3.5–5.0)
Alkaline Phosphatase: 86 U/L (ref 38–126)
Anion gap: 9 (ref 5–15)
BUN: 7 mg/dL — ABNORMAL LOW (ref 8–23)
CO2: 25 mmol/L (ref 22–32)
Calcium: 8.2 mg/dL — ABNORMAL LOW (ref 8.9–10.3)
Chloride: 101 mmol/L (ref 98–111)
Creatinine, Ser: 0.72 mg/dL (ref 0.44–1.00)
GFR, Estimated: >60 mL/min (ref >60)
Glucose, Bld: 118 mg/dL — ABNORMAL HIGH (ref 70–99)
Potassium: 3.5 mmol/L (ref 3.5–5.1)
Sodium: 135 mmol/L (ref 135–145)
Total Bilirubin: 0.8 mg/dL (ref 0.3–1.2)
Total Protein: 6.3 g/dL — ABNORMAL LOW (ref 6.5–8.1)

## 2022-04-11 LAB — URINALYSIS, ROUTINE W REFLEX MICROSCOPIC
Bacteria, UA: NONE SEEN
Bilirubin Urine: NEGATIVE
Glucose, UA: NEGATIVE mg/dL
Ketones, ur: NEGATIVE mg/dL
Nitrite: NEGATIVE
Protein, ur: NEGATIVE mg/dL
Specific Gravity, Urine: 1.005 (ref 1.005–1.030)
pH: 8 (ref 5.0–8.0)

## 2022-04-11 LAB — C DIFFICILE QUICK SCREEN W PCR REFLEX
C Diff antigen: NEGATIVE
C Diff interpretation: NOT DETECTED
C Diff toxin: NEGATIVE

## 2022-04-11 LAB — TROPONIN I (HIGH SENSITIVITY): Troponin I (High Sensitivity): 13 ng/L (ref <18)

## 2022-04-11 LAB — BRAIN NATRIURETIC PEPTIDE: B Natriuretic Peptide: 38.8 pg/mL (ref 0.0–100.0)

## 2022-04-11 MED ORDER — ACETAMINOPHEN 650 MG RE SUPP
650.0000 mg | Freq: Four times a day (QID) | RECTAL | Status: DC | PRN
Start: 2022-04-11 — End: 2022-04-12

## 2022-04-11 MED ORDER — ENOXAPARIN SODIUM 40 MG/0.4ML IJ SOSY
40.0000 mg | PREFILLED_SYRINGE | INTRAMUSCULAR | Status: DC
  Administered 2022-04-12 – 2022-04-14 (×3): 40 mg via SUBCUTANEOUS
  Filled 2022-04-11 (×3): qty 0.4

## 2022-04-11 MED ORDER — PROCHLORPERAZINE EDISYLATE 10 MG/2ML IJ SOLN
5.0000 mg | Freq: Four times a day (QID) | INTRAMUSCULAR | Status: DC | PRN
  Administered 2022-04-11 – 2022-04-12 (×2): 5 mg via INTRAVENOUS
  Filled 2022-04-11 (×2): qty 2

## 2022-04-11 MED ORDER — METOPROLOL SUCCINATE ER 50 MG PO TB24
50.0000 mg | ORAL_TABLET | Freq: Every day | ORAL | Status: DC
  Administered 2022-04-12 – 2022-04-14 (×3): 50 mg via ORAL
  Filled 2022-04-11 (×3): qty 1

## 2022-04-11 MED ORDER — AMITRIPTYLINE HCL 10 MG PO TABS
10.0000 mg | ORAL_TABLET | Freq: Two times a day (BID) | ORAL | Status: DC
  Administered 2022-04-11 – 2022-04-14 (×6): 10 mg via ORAL
  Filled 2022-04-11 (×7): qty 1

## 2022-04-11 MED ORDER — LACTATED RINGERS IV SOLN: INTRAVENOUS | Status: DC

## 2022-04-11 MED ORDER — HYDRALAZINE HCL 20 MG/ML IJ SOLN
10.0000 mg | Freq: Four times a day (QID) | INTRAMUSCULAR | Status: DC | PRN
  Administered 2022-04-11: 10 mg via INTRAVENOUS
  Filled 2022-04-11: qty 1

## 2022-04-11 MED ORDER — IOHEXOL 350 MG/ML SOLN
100.0000 mL | Freq: Once | INTRAVENOUS | Status: AC | PRN
  Administered 2022-04-11: 100 mL via INTRAVENOUS

## 2022-04-11 MED ORDER — FAMOTIDINE 20 MG PO TABS
10.0000 mg | ORAL_TABLET | Freq: Two times a day (BID) | ORAL | Status: DC
  Administered 2022-04-11 – 2022-04-14 (×6): 10 mg via ORAL
  Filled 2022-04-11 (×6): qty 1

## 2022-04-11 MED ORDER — TRAMADOL HCL 50 MG PO TABS
50.0000 mg | ORAL_TABLET | Freq: Once | ORAL | Status: AC
  Administered 2022-04-11: 50 mg via ORAL
  Filled 2022-04-11: qty 1

## 2022-04-11 MED ORDER — LACTATED RINGERS IV BOLUS
1000.0000 mL | Freq: Once | INTRAVENOUS | Status: AC
  Administered 2022-04-11: 1000 mL via INTRAVENOUS

## 2022-04-11 MED ORDER — ONDANSETRON HCL 4 MG/2ML IJ SOLN
4.0000 mg | Freq: Once | INTRAMUSCULAR | Status: AC
  Administered 2022-04-11: 4 mg via INTRAVENOUS
  Filled 2022-04-11: qty 2

## 2022-04-11 MED ORDER — ACETAMINOPHEN 325 MG PO TABS
650.0000 mg | ORAL_TABLET | Freq: Four times a day (QID) | ORAL | Status: DC | PRN
  Administered 2022-04-12: 650 mg via ORAL
  Filled 2022-04-11: qty 2

## 2022-04-11 MED ORDER — TRAMADOL HCL 50 MG PO TABS
50.0000 mg | ORAL_TABLET | Freq: Four times a day (QID) | ORAL | Status: DC | PRN
  Administered 2022-04-11 – 2022-04-12 (×2): 100 mg via ORAL
  Filled 2022-04-11 (×2): qty 2

## 2022-04-11 NOTE — Assessment & Plan Note (Addendum)
   Patient presenting with 24 hours labs significant watery diarrhea  This seems to have started after taking lactulose to manage opiate-induced constipation postoperatively  While patient is not exhibiting any significant electrolyte abnormality, hypotension or renal injury she clinically complains of intense weakness and severe lightheadedness with attempts to stand or ambulate  CT imaging of the abdomen and pelvis reveals no evidence of acute disease  Holding any further doses of laxative  Monitoring for symptomatic improvement  Resuming home regimen of as needed opiates for her ongoing knee pain which should help slow down the bowel movements

## 2022-04-11 NOTE — Assessment & Plan Note (Signed)
   Chest imaging performed after several liters of fluids in the ED reveals early pulmonary edema  Patient has a known history of diastolic dysfunction with preserved ejection fraction based on echocardiogram in 2021  We will abstain from further aggressive intravenous volume resuscitation  No other clinical evidence of cardiogenic volume overload on exam, BNP ordered  Repeat echo for the morning ordered

## 2022-04-11 NOTE — ED Notes (Signed)
Pt too weak to ambulate 

## 2022-04-11 NOTE — Assessment & Plan Note (Addendum)
   Patient complaining of intense lightheadedness upon rising from seated position  I presume this is secondary to her numerous bouts of diarrhea in the past 24 hours and symptoms should be self-limiting but will expand work-up somewhat to identify other causes  Monitoring patient on telemetry   Obtaining orthostatic vital signs  Holding home regimen of Maxide  Obtaining echocardiogram in the morning

## 2022-04-11 NOTE — ED Provider Notes (Signed)
  Physical Exam  BP (!) 161/74 (BP Location: Right Arm)   Pulse 84   Temp 97.8 F (36.6 C) (Oral)   Resp 18   Ht 5' 7.5" (1.715 m)   Wt 82.1 kg   SpO2 96%   BMI 27.93 kg/m   Physical Exam  Procedures  Procedures  ED Course / MDM    Medical Decision Making Care assumed 3 pm.  Patient was constipated and then was given lactulose and had persistent diarrhea for about 2 days.  Patient was also lightheaded and dizzy and felt like she can pass out.  Electrolytes were unremarkable and signed out pending reassessment.   4 pm I reassessed patient.  Patient still feels very lightheaded and dizzy.  Patient was unable to stand up even after IV fluids.  Ordered a second liter normal saline bolus.  Patient also briefly dropped her sats to 89%.  We will get CT angio chest and CT abdomen pelvis  8:10 PM I reviewed her labs and imaging studies.  Patient's CT abdomen pelvis showed no colitis.  Her CT a chest showed mild pulmonary edema.  Patient has no oxygen requirement right now.  Patient is still very dizzy despite second liter bolus. At this point, patient will need to be admitted for observation.   Problems Addressed: Dehydration: acute illness or injury Diarrhea, unspecified type: acute illness or injury  Amount and/or Complexity of Data Reviewed Labs: ordered. Decision-making details documented in ED Course. Radiology: ordered and independent interpretation performed. Decision-making details documented in ED Course.  Risk Prescription drug management. Decision regarding hospitalization.          Drenda Freeze, MD 04/11/22 2011

## 2022-04-11 NOTE — H&P (Signed)
History and Physical    Patient: Jennifer Melendez MRN: [id number] DOA: 04/11/2022  Date of Service: the patient was seen and examined on 04/12/2022  Patient coming from: Home  Chief Complaint:  Chief Complaint  Patient presents with   Dizziness   Weakness    HPI:   81 year old female with past medical history of Barrett's esophagus, hyperlipidemia, hypertension and paroxysmal atrial tachycardia who was hospitalized for total right knee arthroplasty in the setting of advanced osteoarthritis performed on 6/6 by Dr. Rhona Raider.  Patient explains that since the surgery she has been using as needed tramadol for postsurgical pain.  In the days that followed, patient noted that she was becoming extremely constipated.  Due to ongoing constipation patient was prescribed lactulose and began taking on 6/15.  Since taking the prescribed laxatives patient has been experiencing intense and frequent bouts of diarrhea and has moved her bowels innumerable times for nearly the past 24 hours.  Patient complains of associated generalized abdominal cramping, nausea as well as progressively worsening generalized weakness lightheadedness and episodes of near loss of consciousness.  Despite not taking any more laxative patient's symptoms continue to persist throughout the evening with progressively worsening weakness prompting the patient to present to [facility] emergency department on 6/16 for evaluation.  Upon evaluation in the emergency department patient was felt to be suffering from volume depletion from poor response to recent laxative use.  Patient was administered 2 L of intravenous isotonic fluids.  Attempts were made on multiple occasions to get the patient to ambulate but due to severe weakness and lightheadedness patient was unable to do so.  CT imaging of the abdomen and pelvis was performed revealing no evidence of infectious process.  CT angiogram of the chest was additionally performed to  rule out PE postoperatively which was negative for PE but did reveal very mild pulmonary edema and trace bilateral effusions.  Hospitalist group was then called to assess the patient for admission to the hospital.  Review of Systems: Review of Systems  Gastrointestinal:  Positive for abdominal pain, diarrhea and nausea.  Neurological:  Positive for weakness.  All other systems reviewed and are negative.    Past Medical History:  Diagnosis Date   Acute sphenoidal sinusitis 06/28/2014   Last Assessment & Plan:  Formatting of this note might be different from the original. Pt will continue nasal steroid. Z pack sent to pharmacy.   Anemia    Axillary fullness 04/06/2017   Barrett's esophagus with esophagitis 07/04/2015   Cancer ([place])    1979 cervical   Chronic fatigue 06/28/2014   Last Assessment & Plan:  Formatting of this note might be different from the original. Psychological condition is worsening. May need to do sleep study to r/o OSA check labs today Psychological condition  will be reassessed in 3 months.   Constipation    Fibromyalgia    Gastritis 10/07/2016   Headache    Hx of bilateral breast implants 04/06/2017   Hyperlipemia 05/26/2014   Last Assessment & Plan:  Formatting of this note might be different from the original. Lipid abnormalities are unchanged. Pt off cholesterol medicine Nutritional counseling was provided. and Pharmacotherapy as ordered. Lipids will be reassessed in 6 months. Formatting of this note might be different from the original. Last Assessment & Plan:  Lipid abnormalities are unchanged. Pt off cholesterol m   Hyperlipidemia    Hypertension 05/26/2014   Last Assessment & Plan:  Formatting of this note might be different from  the original. Hypertension is improving with treatment. Continue current treatment regimen. Dietary sodium restriction. Regular aerobic exercise. Blood pressure will be reassessed in 4 months. Formatting of this note might be different  from the original. Last Assessment & Plan:  Hypertension is improving with treatment. Conti   Meningioma [facility]) 05/26/2014   Last Assessment & Plan:  Formatting of this note might be different from the original. Pt needs f/u brain MRI   Otalgia of left ear 04/16/2016   Other chest pain 06/10/2019   PAC (premature atrial contraction) 07/04/2020   Palpitations    PAT (paroxysmal atrial tachycardia) ([place]) 07/04/2020   Preventative health care 08/14/2015   PVC (premature ventricular contraction)    PVC's (premature ventricular contractions) 06/10/2019   Seasonal allergies 05/26/2014   Last Assessment & Plan:  Formatting of this note might be different from the original. Pt taking OTC allergy med Formatting of this note might be different from the original. 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 She has never used smokeless tobacco. She reports that she does not drink alcohol and does not use drugs.  Allergies  Allergen Reactions   Amlodipine Besylate Other (See Comments)    Unknown Maybe body shaking    Ciprofloxacin Other (See Comments)    Unknown reaction   Diltiazem Hcl Er Other (See Comments)    Shaking of arms, eyes not focusing, stuffy    Morphine Nausea And Vomiting and Other (See Comments)    Could not stand up   Morpholine Salicylate Other (See Comments)    Could not stand up   Prednisolone Other (See Comments)    Unknown reaction    Gabapentin Hives        Losartan Potassium Palpitations   Ofloxacin Other (See Comments)    "Felt like she was dying"    Prednisone Rash and Other (See Comments)    Facial numbness    Family History  Problem Relation Age of Onset   Hypertension Mother    Lung cancer Mother    Cancer Father    Diabetes Sister    Heart attack Sister    Leukemia Brother    Diabetes Maternal Grandmother     Prior to Admission medications   Medication Sig Start Date End Date Taking? Authorizing Provider  acetaminophen (TYLENOL) 500 MG tablet Take 500 mg by mouth 2 (two) times daily as needed (knee pain).    [provider]  amitriptyline (ELAVIL) 10 MG tablet Take 10 mg by mouth in the morning and at bedtime.    [provider]  aspirin 81 MG chewable tablet Chew 1 tablet (81 mg total) by mouth 2 (two) times daily. For 2 weeks then once a day for 2 weeks for DVT prevention. 04/03/22   Loni Dolly, PA-C  aspirin EC 81 MG tablet Take 1 tablet (81  mg total) by mouth 2 (two) times daily after a meal. For 2 weeks then once a day for 2 weeks for blood clot prevention. 04/01/22 04/01/23  Loni Dolly, PA-C  diclofenac Sodium (VOLTAREN) 1 % GEL Apply 1 application. topically See admin instructions. 1 application 2-3 times a day    [provider]  dicyclomine (BENTYL) 10 MG capsule Take 10 mg by mouth 2 (two) times daily. 03/07/19   [provider]  famotidine (PEPCID) 20 MG tablet Take 20-60 mg by mouth daily.    [provider]  methocarbamol (ROBAXIN) 500 MG tablet Take 1 tablet (500 mg total) by mouth every 6 (six) hours as needed for muscle spasms. 04/04/22   McKenzie, Lennie Muckle, PA-C  metoprolol succinate (TOPROL-XL) 50 MG 24 hr tablet Take 50 mg by mouth daily. 06/29/20   [provider]  OVER THE COUNTER MEDICATION Take 1 tablet by mouth See admin instructions. 1 chew twice every other day or when able to remember.  Beet chews    [provider]   Polyvinyl Alcohol-Povidone (REFRESH OP) Place 1 drop into both eyes daily as needed (dry eye).    [provider]  pregabalin (LYRICA) 100 MG capsule Take 100 mg by mouth 3 (three) times daily. 11/04/19   [provider]  traMADol (ULTRAM) 50 MG tablet Take 1-2 tablets (50-100 mg total) by mouth every 6 (six) hours as needed for moderate pain or severe pain (post op pain.). 04/04/22   McKenzie, Lennie Muckle, PA-C  triamterene-hydrochlorothiazide (MAXZIDE-25) 37.5-25 MG tablet Take 1 tablet by mouth daily as needed (fluid). 01/06/20   [provider]  Trolamine Salicylate (ASPERCREME EX) Apply 1 application. topically daily as needed (knee pain).    [provider]    Physical Exam:  Vitals:   04/11/22 2030 04/11/22 2101 04/12/22 0104 04/12/22 0519  BP: (!) 167/67 (!) 182/80 (!) 152/72 (!) 149/68  Pulse: 84 83 89 90  Resp: '19 18 18 18  '$ Temp: 98.1 F (36.7 C) 98.3 F (36.8 C) 98.2 F (36.8 C) 98.2 F (36.8 C)  TempSrc: Oral Oral Oral Oral  SpO2: 94% 95% 93% 94%  Weight:      Height:        Constitutional: Awake alert and oriented x3, no associated distress.   Skin: no rashes, no lesions, good skin turgor noted. Eyes: Pupils are equally reactive to light.  No evidence of scleral icterus or conjunctival pallor.  ENMT: Moist mucous membranes noted.  Posterior pharynx clear of any exudate or lesions.   Neck: normal, supple, no masses, no thyromegaly.  No evidence of jugular venous distension.   Respiratory: clear to auscultation bilaterally, no wheezing, no crackles. Normal respiratory effort. No accessory muscle use.  Cardiovascular: Regular rate and rhythm, no murmurs / rubs / gallops. No extremity edema. 2+ pedal pulses. No carotid bruits.  Chest:   Nontender without crepitus or deformity.   Back:   Nontender without crepitus or deformity. Abdomen: Abdomen is soft and nontender.  No evidence of intra-abdominal masses.  Positive bowel sounds noted in all  quadrants.   Musculoskeletal: No joint deformity upper and lower extremities. Good ROM, no contractures. Normal muscle tone.  Neurologic: CN 2-12 grossly intact. Sensation intact.  Patient moving all 4 extremities spontaneously.  Patient is following all commands.  Patient is responsive to verbal stimuli.   Psychiatric: Patient exhibits normal mood with appropriate affect.  Patient seems to possess insight as to their current situation.  Data Reviewed:  I have personally reviewed and interpreted labs, imaging.  Significant findings are:  Chemistry revealing sodium 135 potassium 3.5, chloride 101, BUN 7, creatinine 0.72. Urinalysis revealing white blood cell count of 12.3, hemoglobin 12.1, hematocrit 37.3, platelet 421 Chest x-ray personally reviewed revealing evidence of bilateral breast implants, otherwise no evidence of acute cardiopulmonary disease.  EKG: Personally reviewed.  Rhythm is normal sinus rhythm with heart rate of 83 bpm.  No dynamic ST segment changes appreciated.   Assessment and Plan: * Acute diarrhea Patient presenting with 24 hours labs significant watery diarrhea This seems to have started after taking lactulose to manage opiate-induced constipation postoperatively While patient is not exhibiting any significant electrolyte abnormality, hypotension or renal injury she clinically complains of intense weakness and severe lightheadedness with attempts to stand or ambulate CT imaging of the abdomen and pelvis reveals no evidence of acute disease Holding any further doses of laxative ER provider ordered C. difficile stool testing as well as GI pathology panel, we will follow-up on these results although I expect they will be negative. Monitoring for symptomatic improvement Resuming home regimen of as needed opiates for her ongoing knee pain which should help slow down the bowel movements   Postural dizziness with presyncope Patient complaining of intense  lightheadedness upon rising from seated position I presume this is secondary to her numerous bouts of diarrhea in the past 24 hours and symptoms should be self-limiting but will expand work-up somewhat to identify other causes Monitoring patient on telemetry  Obtaining orthostatic vital signs Holding home regimen of Maxide Obtaining echocardiogram in the morning   Pulmonary edema Chest imaging performed after several liters of fluids in the ED reveals early pulmonary edema Patient has a known history of diastolic dysfunction with preserved ejection fraction based on echocardiogram in 2021 We will abstain from further aggressive intravenous volume resuscitation No other clinical evidence of cardiogenic volume overload on exam, BNP ordered Repeat echo for the morning ordered  PAT (paroxysmal atrial tachycardia) ([place]) Continuing home regimen of metoprolol Monitoring patient on telemetry  Essential hypertension Holding home regimen of Maxide Resuming home regimen of metoprolol considering history of atrial tachycardia As needed intravenous antihypertensives for markedly elevated blood pressure  GERD (gastroesophageal reflux disease) Continuing home regimen of low-dose famotidine twice daily       Code Status:  Full code  code status decision has been confirmed with: patient   Consults: none  Severity of Illness:  The appropriate patient status for this patient is OBSERVATION. Observation status is judged to be reasonable and necessary in order to provide the required intensity of service to ensure the patient's safety. The patient's presenting symptoms, physical exam findings, and initial radiographic and laboratory data in the context of their medical condition is felt to place them at decreased risk for further clinical deterioration. Furthermore, it is anticipated that the patient will be medically stable for discharge from the hospital within 2 midnights of admission.    Author:  Vernelle Emerald MD  04/12/2022 8:27 AM

## 2022-04-11 NOTE — ED Triage Notes (Signed)
Per EMS- Patient is 10 days post op-right knee replacement. Patient was constipated yesterday and took laxatives and was up several times to the bathroom. Patient is now c/o dizziness and weakness.  Patient received zofran 4 mg IV prior to arrival to the ED.

## 2022-04-11 NOTE — ED Notes (Signed)
Dr. Yao at the bedside. 

## 2022-04-11 NOTE — Plan of Care (Signed)

## 2022-04-11 NOTE — ED Provider Notes (Addendum)
[facility] DEPT Provider Note  CSN: [id number] Arrival date & time: 04/11/22 1034  Chief Complaint(s) Dizziness and Weakness  HPI Jennifer Melendez is a 81 y.o. female stop day 43 from a right TKA who presents emergency department for evaluation of dizziness and weakness.  Patient currently on tramadol and was having constipation.  Ultimately prescribed lactulose and had a large bowel cleanout last night with 12 hours of constant diarrhea and copious bowel movements.  This morning, she states that she felt very dizzy and confused with associated headache.  Patient took Tylenol prior to arrival and her headache resolved.  She denies chest pain, shortness of breath, vomiting or other systemic symptoms.   Past Medical History Past Medical History:  Diagnosis Date   Acute sphenoidal sinusitis 06/28/2014   Last Assessment & Plan:  Formatting of this note might be different from the original. Pt will continue nasal steroid. Z pack sent to pharmacy.   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 Psychological condition is worsening. May need to do sleep study to r/o OSA check labs today Psychological condition  will be reassessed in 3 months.   Constipation    Fibromyalgia    Gastritis 10/07/2016   Headache    Hx of bilateral breast implants 04/06/2017   Hyperlipemia 05/26/2014   Last Assessment & Plan:  Formatting of this note might be different from the original. Lipid abnormalities are unchanged. Pt off cholesterol medicine Nutritional counseling was provided. and Pharmacotherapy as ordered. Lipids will be reassessed in 6 months. Formatting of this note might be different from the original. Last Assessment & Plan:  Lipid abnormalities are unchanged. Pt off cholesterol  m   Hyperlipidemia    Hypertension 05/26/2014   Last Assessment & Plan:  Formatting of this note might be different from the original. Hypertension is improving with treatment. Continue current treatment regimen. Dietary sodium restriction. Regular aerobic exercise. Blood pressure will be reassessed in 4 months. Formatting of this note might be different from the original. Last Assessment & Plan:  Hypertension is improving with treatment. Conti   Meningioma [facility]) 05/26/2014   Last Assessment & Plan:  Formatting of this note might be different from the original. Pt needs f/u brain MRI   Otalgia of left ear 04/16/2016   Other chest pain 06/10/2019   PAC (premature atrial contraction) 07/04/2020   Palpitations    PAT (paroxysmal atrial tachycardia) ([place]) 07/04/2020   Preventative health care 08/14/2015   PVC (premature ventricular contraction)    PVC's (premature ventricular contractions) 06/10/2019   Seasonal allergies 05/26/2014   Last Assessment & Plan:  Formatting of this note might be different from the original. Pt taking OTC allergy med Formatting of this note might be different from the original. 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/01/2022   Family history of malignant neoplasm of gastrointestinal tract 12/10/2020   Fecal impaction ([place]) 12/10/2020   Hemorrhage of rectum and anus 12/10/2020   PVC (premature ventricular contraction)    Palpitations    Hyperlipidemia    Cancer ([facility])    PAT (paroxysmal atrial tachycardia) ([place])  07/04/2020   PAC (premature atrial contraction) 07/04/2020   Other chest pain 06/10/2019   PVC's (premature ventricular contractions) 06/10/2019   Axillary fullness 04/06/2017   Hx of bilateral breast implants 04/06/2017   Constipation 10/07/2016   Gastritis 10/07/2016   Otalgia of left ear 04/16/2016   Tinnitus  of both ears 04/16/2016   Preventative health care 08/14/2015   Barrett's esophagus with esophagitis 07/04/2015   Acute sphenoidal sinusitis 06/28/2014   Chronic fatigue 06/28/2014   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 Authorizing Provider  acetaminophen (TYLENOL) 500 MG tablet Take 500 mg by mouth 2 (two) times daily as needed (knee pain).    [provider]  amitriptyline (ELAVIL) 10 MG tablet Take 10 mg by mouth in the morning and at bedtime.    [provider]  aspirin 81 MG chewable tablet Chew 1 tablet (81 mg total) by mouth 2 (two) times daily. For 2 weeks then once a day for 2 weeks for DVT prevention. 04/03/22   Loni Dolly, PA-C  aspirin EC 81 MG tablet Take 1 tablet (81 mg total) by mouth 2 (two) times daily after a meal. For 2 weeks then once a day for 2 weeks for blood clot prevention. 04/01/22 04/01/23  Loni Dolly, PA-C  diclofenac Sodium (VOLTAREN) 1 % GEL Apply 1 application. topically See admin instructions. 1 application 2-3 times a day    [provider]  dicyclomine (BENTYL) 10 MG capsule Take 10 mg by mouth 2 (two) times daily. 03/07/19   [provider]  famotidine (PEPCID) 20 MG tablet Take 20-60 mg by mouth daily.    [provider]  methocarbamol (ROBAXIN) 500 MG tablet Take 1 tablet (500 mg total) by mouth every 6 (six) hours as needed for muscle spasms. 04/04/22   McKenzie, Lennie Muckle, PA-C  metoprolol succinate (TOPROL-XL) 50 MG 24 hr tablet Take 50 mg by mouth daily. 06/29/20   [provider]  OVER THE COUNTER MEDICATION Take 1 tablet by mouth See admin instructions. 1 chew twice every other day or when able to remember.  Beet chews    [provider]  Polyvinyl  Alcohol-Povidone (REFRESH OP) Place 1 drop into both eyes daily as needed (dry eye).    [provider]  pregabalin (LYRICA) 100 MG capsule Take 100 mg by mouth 3 (three) times daily. 11/04/19   [provider]  traMADol (ULTRAM) 50 MG tablet Take 1-2 tablets (50-100 mg total) by mouth every 6 (six) hours as needed for moderate pain or severe pain (post op pain.). 04/04/22   McKenzie, Lennie Muckle, PA-C  triamterene-hydrochlorothiazide (MAXZIDE-25) 37.5-25 MG tablet Take 1 tablet by mouth daily as needed (fluid). 01/06/20   [provider]  Trolamine Salicylate (ASPERCREME EX) Apply 1 application. topically daily as needed (knee pain).    [provider]  Past Surgical History Past Surgical History:  Procedure Laterality Date   BACK SURGERY     herniated disc L 5   BRAIN SURGERY  2000   menigioma   BREAST SURGERY     1977 implants Left lump removed benign   CHOLECYSTECTOMY  1994   TOTAL KNEE ARTHROPLASTY Right 04/01/2022   Procedure: RIGHT TOTAL KNEE ARTHROPLASTY;  Surgeon: Melrose Nakayama, MD;  Location: [facility] ORS;  Service: Orthopedics;  Laterality: Right;   TUBAL LIGATION     varicose vein surgery     Family History Family History  Problem Relation Age of Onset   Hypertension Mother    Lung cancer Mother    Cancer Father    Diabetes Sister    Heart attack Sister    Leukemia Brother    Diabetes Maternal Grandmother     Social History Social History   Tobacco Use   Smoking status: Never   Smokeless tobacco: Never  Vaping Use   Vaping Use: Never used  Substance Use Topics   Alcohol use: Never   Drug use: Never   Allergies Amlodipine besylate, Ciprofloxacin, Diltiazem hcl er, Morphine, Morpholine salicylate, Prednisolone, Gabapentin, Losartan potassium, Ofloxacin, and Prednisone  Review of Systems Review of Systems   Gastrointestinal:  Positive for constipation and diarrhea.  Neurological:  Positive for dizziness.    Physical Exam Vital Signs  I have reviewed the triage vital signs BP (!) 167/63   Pulse 82   Temp 97.8 F (36.6 C) (Oral)   Resp 19   Ht 5' 7.5" (1.715 m)   Wt 82.1 kg   SpO2 98%   BMI 27.93 kg/m   Physical Exam Vitals and nursing note reviewed.  Constitutional:      General: She is not in acute distress.    Appearance: She is well-developed.  HENT:     Head: Normocephalic and atraumatic.  Eyes:     Conjunctiva/sclera: Conjunctivae normal.  Cardiovascular:     Rate and Rhythm: Normal rate and regular rhythm.     Heart sounds: No murmur heard. Pulmonary:     Effort: Pulmonary effort is normal. No respiratory distress.     Breath sounds: Normal breath sounds.  Abdominal:     Palpations: Abdomen is soft.     Tenderness: There is no abdominal tenderness.  Musculoskeletal:        General: No swelling.     Cervical back: Neck supple.  Skin:    General: Skin is warm and dry.     Capillary Refill: Capillary refill takes less than 2 seconds.  Neurological:     Mental Status: She is alert.  Psychiatric:        Mood and Affect: Mood normal.     ED Results and Treatments Labs (all labs ordered are listed, but only abnormal results are displayed) Labs Reviewed  CBC WITH DIFFERENTIAL/PLATELET - Abnormal; Notable for the following components:      Result Value   WBC 12.3 (*)    Platelets 421 (*)    Neutro Abs 9.6 (*)    Abs Immature Granulocytes 0.20 (*)    All other components within normal limits  COMPREHENSIVE METABOLIC PANEL - Abnormal; Notable for the following components:   Glucose, Bld 118 (*)    BUN 7 (*)    Calcium 8.2 (*)    Total Protein 6.3 (*)    Albumin 3.1 (*)    All other components within normal limits  URINALYSIS, ROUTINE W REFLEX MICROSCOPIC - Abnormal; Notable  for the following components:   Hgb urine dipstick SMALL (*)    Leukocytes,Ua  MODERATE (*)    All other components within normal limits  TROPONIN I (HIGH SENSITIVITY)  TROPONIN I (HIGH SENSITIVITY)                                                                                                                          Radiology CT Head Wo Contrast  Result Date: 04/11/2022 CLINICAL DATA:  Mental status change. Status post right knee replacement EXAM: CT HEAD WITHOUT CONTRAST TECHNIQUE: Contiguous axial images were obtained from the base of the skull through the vertex without intravenous contrast. RADIATION DOSE REDUCTION: This exam was performed according to the departmental dose-optimization program which includes automated exposure control, adjustment of the mA and/or kV according to patient size and/or use of iterative reconstruction technique. COMPARISON:  11/03/2017 FINDINGS: Brain: No evidence of acute infarction, hemorrhage, extra-axial collection, ventriculomegaly, or mass effect. Old left frontal lobe infarct with ex vacuo dilatation of the left frontal horn of the lateral ventricle. Generalized cerebral atrophy. Periventricular white matter low attenuation likely secondary to microangiopathy. Vascular: Cerebrovascular atherosclerotic calcifications are noted. Skull: Negative for fracture or focal lesion.  Bifrontal burr holes. Sinuses/Orbits: Visualized portions of the orbits are unremarkable. Visualized portions of the paranasal sinuses are unremarkable. Visualized portions of the mastoid air cells are unremarkable. Other: None. IMPRESSION: 1. No acute intracranial findings. 2. Old left frontal lobe infarct. 3. Chronic small vessel ischemic changes. Electronically Signed   By: Kathreen Devoid M.D.   On: 04/11/2022 13:12   DG Chest Port 1 View  Result Date: 04/11/2022 CLINICAL DATA:  Altered mental status. Weakness. Ten days postop right knee replacement. Dizziness and weakness. EXAM: PORTABLE CHEST 1 VIEW COMPARISON:  AP chest 02/14/2020; chest two views 06/10/2019  FINDINGS: Moderate atherosclerotic calcifications of the aortic arch. Cardiac silhouette and mediastinal contours are within normal limits. Note is made of bilateral breast implants with peripheral capsular calcifications. The left breast implant is likely the cause for density overlying the left lung base. There are bilateral upper lung lucencies compatible with emphysematous change. No definite pleural effusion. No pneumothorax. Moderate multilevel degenerative disc changes of the thoracic spine. IMPRESSION: Bilateral breast implants with peripheral capsular calcification are again seen. Associated density is likely the etiology of relatively increased density overlying the left lung base, due to the position of the breast implant on frontal view. This is similar to the prior comparison studies when portions of the breast implants overlie the lungs. No definite acute lung process. Electronically Signed   By: Yvonne Kendall M.D.   On: 04/11/2022 12:56    Pertinent labs & imaging results that were available during my care of the patient were reviewed by me and considered in my medical decision making (see MDM for details).  Medications Ordered in ED Medications  lactated ringers infusion (has no administration in time range)  lactated ringers bolus 1,000 mL (1,000 mLs Intravenous New Bag/Given  04/11/22 1411)  traMADol (ULTRAM) tablet 50 mg (50 mg Oral Given 04/11/22 1203)                                                                                                                                     Procedures .Critical Care  Performed by: Teressa Lower, MD Authorized by: Teressa Lower, MD   Critical care provider statement:    Critical care time (minutes):  30   Critical care was necessary to treat or prevent imminent or life-threatening deterioration of the following conditions:  Dehydration   Critical care was time spent personally by me on the following activities:  Development of treatment  plan with patient or surrogate, discussions with consultants, evaluation of patient's response to treatment, examination of patient, ordering and review of laboratory studies, ordering and review of radiographic studies, ordering and performing treatments and interventions, pulse oximetry, re-evaluation of patient's condition and review of old charts   (including critical care time)  Medical Decision Making / ED Course   This patient presents to the ED for concern of dizziness, this involves an extensive number of treatment options, and is a complaint that carries with it a high risk of complications and morbidity.  The differential diagnosis includes hypovolemia, orthostatic presyncope, vertigo, medication side effect  MDM: Patient seen in the emergency room for evaluation of dizziness.  Physical exam largely unremarkable with no focal motor or sensory deficits.  No cranial nerve deficits.  Laboratory evaluations with a mild leukocytosis to 12.3 but is otherwise unremarkable.  Fluid resuscitation begun and patient signed out to oncoming provider.  Please see provider signout for continuation of work-up.  Anticipate discharge home after fluid resuscitation.   Additional history obtained: -Additional history obtained from daughter -External records from outside source obtained and reviewed including: Chart review including previous notes, labs, imaging, consultation notes   Lab Tests: -I ordered, reviewed, and interpreted labs.   The pertinent results include:   Labs Reviewed  CBC WITH DIFFERENTIAL/PLATELET - Abnormal; Notable for the following components:      Result Value   WBC 12.3 (*)    Platelets 421 (*)    Neutro Abs 9.6 (*)    Abs Immature Granulocytes 0.20 (*)    All other components within normal limits  COMPREHENSIVE METABOLIC PANEL - Abnormal; Notable for the following components:   Glucose, Bld 118 (*)    BUN 7 (*)    Calcium 8.2 (*)    Total Protein 6.3 (*)    Albumin  3.1 (*)    All other components within normal limits  URINALYSIS, ROUTINE W REFLEX MICROSCOPIC - Abnormal; Notable for the following components:   Hgb urine dipstick SMALL (*)    Leukocytes,Ua MODERATE (*)    All other components within normal limits  TROPONIN I (HIGH SENSITIVITY)  TROPONIN I (HIGH SENSITIVITY)      EKG   EKG Interpretation  Date/Time:  Friday April 11 2022 11:16:08 EDT Ventricular Rate:  83 PR Interval:  148 QRS Duration: 82 QT Interval:  367 QTC Calculation: 432 R Axis:   32 Text Interpretation: Sinus rhythm Atrial premature complexes Confirmed by Athziry Millican ([id number]) on 04/11/2022 3:36:32 PM        Medicines ordered and prescription drug management: Meds ordered this encounter  Medications   lactated ringers bolus 1,000 mL   lactated ringers infusion   traMADol (ULTRAM) tablet 50 mg    -I have reviewed the patients home medicines and have made adjustments as needed  Critical interventions none  Cardiac Monitoring: The patient was maintained on a cardiac monitor.  I personally viewed and interpreted the cardiac monitored which showed an underlying rhythm of: NSR  Social Determinants of Health:  Factors impacting patients care include: none   Reevaluation: After the interventions noted above, I reevaluated the patient and found that they have :improved  Co morbidities that complicate the patient evaluation  Past Medical History:  Diagnosis Date   Acute sphenoidal sinusitis 06/28/2014   Last Assessment & Plan:  Formatting of this note might be different from the original. Pt will continue nasal steroid. Z pack sent to pharmacy.   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 Psychological condition is worsening. May need to do sleep study to r/o OSA  check labs today Psychological condition  will be reassessed in 3 months.   Constipation    Fibromyalgia    Gastritis 10/07/2016   Headache    Hx of bilateral breast implants 04/06/2017   Hyperlipemia 05/26/2014   Last Assessment & Plan:  Formatting of this note might be different from the original. Lipid abnormalities are unchanged. Pt off cholesterol medicine Nutritional counseling was provided. and Pharmacotherapy as ordered. Lipids will be reassessed in 6 months. Formatting of this note might be different from the original. Last Assessment & Plan:  Lipid abnormalities are unchanged. Pt off cholesterol m   Hyperlipidemia    Hypertension 05/26/2014   Last Assessment & Plan:  Formatting of this note might be different from the original. Hypertension is improving with treatment. Continue current treatment regimen. Dietary sodium restriction. Regular aerobic exercise. Blood pressure will be reassessed in 4 months. Formatting of this note might be different from the original. Last Assessment & Plan:  Hypertension is improving with treatment. Conti   Meningioma [facility]) 05/26/2014   Last Assessment & Plan:  Formatting of this note might be different from the original. Pt needs f/u brain MRI   Otalgia of left ear 04/16/2016   Other chest pain 06/10/2019   PAC (premature atrial contraction) 07/04/2020   Palpitations    PAT (paroxysmal atrial tachycardia) ([place]) 07/04/2020   Preventative health care 08/14/2015   PVC (premature ventricular contraction)    PVC's (premature ventricular contractions) 06/10/2019   Seasonal allergies 05/26/2014   Last Assessment & Plan:  Formatting of this note might be different from the original. Pt taking OTC allergy med Formatting of this note might be different from the original. 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  Please  see provider signout for continuation of work-up.     Final Clinical Impression(s) / ED Diagnoses Final diagnoses:  None     '@PCDICTATION'$ @    Teressa Lower, MD 04/11/22 1538    Stevin Bielinski, [place], MD 04/25/22 1434

## 2022-04-11 NOTE — Assessment & Plan Note (Signed)
   Continuing home regimen of metoprolol  Monitoring patient on telemetry

## 2022-04-11 NOTE — ED Provider Triage Note (Signed)
Emergency Medicine Provider Triage Evaluation Note  ELOINA ERGLE , a 81 y.o. female  was evaluated in triage.  Pt complains of dizziness, weakness and knee pain.  Patient is 10 days postop from a right knee replacement by Dr. Latanya Maudlin.  Patient has been following up regularly and completing physical therapy and has been doing well.  Started having a lot of issues with constipation and was prescribed lactulose.  She reports she spent the last 12 hours having constant diarrhea with numerous bowel movements.  She reports she did not have much of anything to eat or drink over the past day and did not take any pain medication and now is having worsened pain in her right knee.  She reports she woke up this morning feeling very swimmy headed and almost drunk but has not had any alcohol or pain medication this morning.  She reports feeling dizzy and did have a bad headache this morning that is since resolved after taking Tylenol.  Review of Systems  Positive: Weakness, dizziness, knee pain, headache, diarrhea Negative: Fever, cough, chest pain, shortness of breath, abdominal pain  Physical Exam  BP (!) 158/78 (BP Location: Left Arm)   Pulse 96   Temp 97.8 F (36.6 C) (Oral)   Resp 18   Ht 5' 7.5" (1.715 m)   Wt 82.1 kg   SpO2 96%   BMI 27.93 kg/m  Gen:   Awake, no distress   Resp:  Normal effort, CTA bilateral Abd:  Abd soft NTTP MSK:   Moves extremities without difficulty, right knee with surgical dressing in place, some swelling present as expected but no redness or warmth. Other:    Medical Decision Making  Medically screening exam initiated at 11:22 AM.  Appropriate orders placed.  MAKYLAH BOSSARD was informed that the remainder of the evaluation will be completed by another provider, this initial triage assessment does not replace that evaluation, and the importance of remaining in the ED until their evaluation is complete.     Jacqlyn Larsen, [place] 04/11/22 1206

## 2022-04-11 NOTE — Assessment & Plan Note (Signed)
   Holding home regimen of Maxide  Resuming home regimen of metoprolol considering history of atrial tachycardia  As needed intravenous antihypertensives for markedly elevated blood pressure

## 2022-04-11 NOTE — ED Notes (Signed)
Dr. Darl Householder okay for pt to have something to eat, NPO order is active, however testing is completed at this time, pt may eat as she has not ate all day. Pt to be given meal tray and something to drink

## 2022-04-11 NOTE — ED Notes (Signed)
Pt became very weak when standing, did not get final orthostatic BP.

## 2022-04-11 NOTE — Assessment & Plan Note (Addendum)
   Continuing home regimen of low-dose famotidine twice daily

## 2022-04-12 ENCOUNTER — Observation Stay (HOSPITAL_COMMUNITY): Payer: Medicare Other

## 2022-04-12 ENCOUNTER — Inpatient Hospital Stay (HOSPITAL_COMMUNITY): Payer: Medicare Other

## 2022-04-12 DIAGNOSIS — M797 Fibromyalgia: Secondary | ICD-10-CM | POA: Diagnosis present

## 2022-04-12 DIAGNOSIS — Z8249 Family history of ischemic heart disease and other diseases of the circulatory system: Secondary | ICD-10-CM | POA: Diagnosis not present

## 2022-04-12 DIAGNOSIS — Z7982 Long term (current) use of aspirin: Secondary | ICD-10-CM | POA: Diagnosis not present

## 2022-04-12 DIAGNOSIS — I471 Supraventricular tachycardia: Secondary | ICD-10-CM | POA: Diagnosis present

## 2022-04-12 DIAGNOSIS — Z881 Allergy status to other antibiotic agents status: Secondary | ICD-10-CM | POA: Diagnosis not present

## 2022-04-12 DIAGNOSIS — R42 Dizziness and giddiness: Secondary | ICD-10-CM | POA: Diagnosis present

## 2022-04-12 DIAGNOSIS — E869 Volume depletion, unspecified: Secondary | ICD-10-CM | POA: Diagnosis present

## 2022-04-12 DIAGNOSIS — Z801 Family history of malignant neoplasm of trachea, bronchus and lung: Secondary | ICD-10-CM | POA: Diagnosis not present

## 2022-04-12 DIAGNOSIS — T474X5A Adverse effect of other laxatives, initial encounter: Secondary | ICD-10-CM | POA: Diagnosis present

## 2022-04-12 DIAGNOSIS — J811 Chronic pulmonary edema: Secondary | ICD-10-CM | POA: Diagnosis present

## 2022-04-12 DIAGNOSIS — Z8541 Personal history of malignant neoplasm of cervix uteri: Secondary | ICD-10-CM | POA: Diagnosis not present

## 2022-04-12 DIAGNOSIS — Z96651 Presence of right artificial knee joint: Secondary | ICD-10-CM | POA: Diagnosis present

## 2022-04-12 DIAGNOSIS — Z885 Allergy status to narcotic agent status: Secondary | ICD-10-CM | POA: Diagnosis not present

## 2022-04-12 DIAGNOSIS — R197 Diarrhea, unspecified: Secondary | ICD-10-CM | POA: Diagnosis present

## 2022-04-12 DIAGNOSIS — E785 Hyperlipidemia, unspecified: Secondary | ICD-10-CM | POA: Diagnosis present

## 2022-04-12 DIAGNOSIS — Z833 Family history of diabetes mellitus: Secondary | ICD-10-CM | POA: Diagnosis not present

## 2022-04-12 DIAGNOSIS — I5021 Acute systolic (congestive) heart failure: Secondary | ICD-10-CM | POA: Diagnosis not present

## 2022-04-12 DIAGNOSIS — Z79899 Other long term (current) drug therapy: Secondary | ICD-10-CM | POA: Diagnosis not present

## 2022-04-12 DIAGNOSIS — E860 Dehydration: Secondary | ICD-10-CM | POA: Diagnosis present

## 2022-04-12 DIAGNOSIS — Z9049 Acquired absence of other specified parts of digestive tract: Secondary | ICD-10-CM | POA: Diagnosis not present

## 2022-04-12 DIAGNOSIS — Y92009 Unspecified place in unspecified non-institutional (private) residence as the place of occurrence of the external cause: Secondary | ICD-10-CM | POA: Diagnosis not present

## 2022-04-12 DIAGNOSIS — Z9882 Breast implant status: Secondary | ICD-10-CM | POA: Diagnosis not present

## 2022-04-12 DIAGNOSIS — K219 Gastro-esophageal reflux disease without esophagitis: Secondary | ICD-10-CM | POA: Diagnosis present

## 2022-04-12 DIAGNOSIS — R55 Syncope and collapse: Secondary | ICD-10-CM | POA: Diagnosis present

## 2022-04-12 DIAGNOSIS — Z806 Family history of leukemia: Secondary | ICD-10-CM | POA: Diagnosis not present

## 2022-04-12 DIAGNOSIS — I10 Essential (primary) hypertension: Secondary | ICD-10-CM | POA: Diagnosis present

## 2022-04-12 LAB — GASTROINTESTINAL PANEL BY PCR, STOOL (REPLACES STOOL CULTURE)

## 2022-04-12 LAB — ECHOCARDIOGRAM COMPLETE
Area-P 1/2: 6.6 cm2
Height: 67.5 in
S' Lateral: 2.5 cm
Weight: 2896 oz

## 2022-04-12 LAB — CBC WITH DIFFERENTIAL/PLATELET
Abs Immature Granulocytes: 0.07 10*3/uL (ref 0.00–0.07)
Basophils Absolute: 0 10*3/uL (ref 0.0–0.1)
Basophils Relative: 0 %
Eosinophils Absolute: 0.4 10*3/uL (ref 0.0–0.5)
Eosinophils Relative: 3 %
HCT: 33.7 % — ABNORMAL LOW (ref 36.0–46.0)
Hemoglobin: 10.9 g/dL — ABNORMAL LOW (ref 12.0–15.0)
Immature Granulocytes: 1 %
Lymphocytes Relative: 16 %
Lymphs Abs: 1.7 10*3/uL (ref 0.7–4.0)
MCH: 30.9 pg (ref 26.0–34.0)
MCHC: 32.3 g/dL (ref 30.0–36.0)
MCV: 95.5 fL (ref 80.0–100.0)
Monocytes Absolute: 0.9 10*3/uL (ref 0.1–1.0)
Monocytes Relative: 9 %
Neutro Abs: 7.4 10*3/uL (ref 1.7–7.7)
Neutrophils Relative %: 71 %
Platelets: 392 10*3/uL (ref 150–400)
RBC: 3.53 MIL/uL — ABNORMAL LOW (ref 3.87–5.11)
RDW: 14.4 % (ref 11.5–15.5)
WBC: 10.5 10*3/uL (ref 4.0–10.5)
nRBC: 0 % (ref 0.0–0.2)

## 2022-04-12 LAB — COMPREHENSIVE METABOLIC PANEL
ALT: 13 U/L (ref 0–44)
AST: 16 U/L (ref 15–41)
Albumin: 2.8 g/dL — ABNORMAL LOW (ref 3.5–5.0)
Alkaline Phosphatase: 74 U/L (ref 38–126)
Anion gap: 6 (ref 5–15)
BUN: 5 mg/dL — ABNORMAL LOW (ref 8–23)
CO2: 26 mmol/L (ref 22–32)
Calcium: 8 mg/dL — ABNORMAL LOW (ref 8.9–10.3)
Chloride: 107 mmol/L (ref 98–111)
Creatinine, Ser: 0.64 mg/dL (ref 0.44–1.00)
GFR, Estimated: >60 mL/min (ref >60)
Glucose, Bld: 103 mg/dL — ABNORMAL HIGH (ref 70–99)
Potassium: 3.7 mmol/L (ref 3.5–5.1)
Sodium: 139 mmol/L (ref 135–145)
Total Bilirubin: 0.6 mg/dL (ref 0.3–1.2)
Total Protein: 5.6 g/dL — ABNORMAL LOW (ref 6.5–8.1)

## 2022-04-12 LAB — MAGNESIUM: Magnesium: 2.4 mg/dL (ref 1.7–2.4)

## 2022-04-12 MED ORDER — LACTATED RINGERS IV SOLN: INTRAVENOUS | Status: DC

## 2022-04-12 MED ORDER — ENSURE ENLIVE PO LIQD
237.0000 mL | Freq: Two times a day (BID) | ORAL | Status: DC
  Administered 2022-04-12 – 2022-04-14 (×4): 237 mL via ORAL

## 2022-04-12 MED ORDER — ACETAMINOPHEN 650 MG RE SUPP: 650.0000 mg | Freq: Four times a day (QID) | RECTAL | Status: DC | PRN

## 2022-04-12 MED ORDER — ACETAMINOPHEN 500 MG PO TABS
1000.0000 mg | ORAL_TABLET | Freq: Four times a day (QID) | ORAL | Status: DC | PRN
  Administered 2022-04-12 – 2022-04-13 (×4): 1000 mg via ORAL
  Administered 2022-04-14: 500 mg via ORAL
  Filled 2022-04-12 (×5): qty 2

## 2022-04-12 MED ORDER — ONDANSETRON HCL 4 MG/2ML IJ SOLN
4.0000 mg | Freq: Four times a day (QID) | INTRAMUSCULAR | Status: DC | PRN
Start: 2022-04-12 — End: 2022-04-14
  Administered 2022-04-12: 4 mg via INTRAVENOUS
  Filled 2022-04-12: qty 2

## 2022-04-12 NOTE — Progress Notes (Signed)
     Jennifer Melendez is a 81 y.o. female   Orthopaedic diagnosis: End-stage arthritis of the right knee  Surgery: Right total knee arthroplasty by Dr. Rhona Raider 04/01/2022  Subjective: Patient presented to the ED on 04/11/22 for evaluation of dizziness and weakness.  She developed postoperative constipation and subsequently elected to try lactulose.  Unfortunately she developed significant and constant diarrhea for nearly 24 hours thereafter. The diarrhea has now stopped.  She was subsequently admitted to the medical team is undergoing workup for dizziness and weakness. She also undergoing workup for infectious cause of diarrhea. She feels she is improving.  She states her right knee pain is well controlled.  She has been trying to work with physical therapy.  Her daughter is present in the room.  She voices to me that she intends to be discharged home.  Objectyive: Vitals:   04/12/22 0846 04/12/22 1405  BP: (!) 157/67 (!) 157/59  Pulse: 81 88  Resp: 18 18  Temp: 98.1 F (36.7 C) 98 F (36.7 C)  SpO2: 92% 95%     Exam: Awake and alert Respirations even and unlabored No acute distress  Left knee shows intact surgical dressing.  This was removed at the patient's request.  Surgical incision appears to be healing well.  No evidence of infection.  Aquasel dressing was replaced uneventfully. Mild appropriate postoperative swelling about the knee.  No deformity or ecchymosis.  She tolerates gentle flexion extension of the knee actively and passively. Calf is soft and nontender.  She is neurovascularly intact distally.  Assessment: Status post right TKR 04/01/2022   Plan: Patient appears to be improving since admission.  Diarrhea has stopped.  Weakness and lightheadedness appear to be improving as well.  I would expect this is likely secondary to dehydration from significant diarrhea.  She is undergoing further medical workup to ensure this is the case.  Okay for PT once orthostatics stable,  Weakness/lightheadedness improves, and medically stable. It appears her postop pain is well controlled.  We will discontinue all narcotic medications and simply use Tylenol in efforts to avoid side effects and further complications.  She was in agreement with this plan.  We will intend to follow the patient along during her hospital stay. Suitable for discharge from an orthopedic standpoint once medically stable.     Fraser Busche J. [place], PA-C

## 2022-04-12 NOTE — Progress Notes (Signed)
PROGRESS NOTE    Jennifer Melendez  YKD:[id number] DOB: 1941-03-10 DOA: 04/11/2022 PCP: Robyne Peers, MD   Brief Narrative: 81 year old with past medical history significant for Barrett's Esophagus, hyperlipidemia, hypertension, paroxysmal atrial tachycardia who was hospitalized for total right knee arthroplasty on 6/6 by Dr. Rhona Raider.  He has been taking tramadol for as needed for postsurgical pain.  She developed constipation and was a started on lactulose 6/15.  Since then she has been having frequent bouts of diarrhea, innumerable over the last 24 hours.  She reported generalized abdominal cramping, nausea.  He is also complaining of weakness, lightheadedness and episode of near loss of consciousness. Evaluation in the ED: Patient received 2 L of IV fluids, she was not able to ambulate due to dizziness.  CT abdomen and pelvis negative for infectious process.  CT angio chest negative for PE, did reveal mild pulmonary edema and trace bilateral effusion.  Patient was admitted for further evaluation.   Assessment & Plan:   Principal Problem:   Acute diarrhea Active Problems:   Postural dizziness with presyncope   PAT (paroxysmal atrial tachycardia) (HCC)   Pulmonary edema   Essential hypertension   GERD (gastroesophageal reflux disease)  1-Acute Diarrhea: Post  Laxative: -Denies further bowel movement since yesterday.  She still feels the  needs to have more  bowel movement.  She was having cramping abdominal pain yesterday when she was having diarrhea.  -Plan to hold Laxatives.  -CT abdomen pelvis negative.  -C diff and GI pathogen negative.   2-Near Syncope, Dizziness;  Orthostatic Vital mildly positive.  She report some nausea  even prior to diarrhea. Diarrhea illness made it worse.  She report constant dizziness, even when she is lying down in bed. She has had some drowsiness  after she takes tramadol, per daughter report.  Plan to keep IV fluids, Monitor pulse oxymetry,  due to mild pulmonary edema on CT Plan to get MRI to rule out stroke, dizziness persist.  Plan to discontinue tramadol to see if that will help with nausea, dizziness.   3-Pulmonary edema on CT: Probably after fluids received in the ED>  ECHO: Ef 60 %, grade 2 diastolic dysfunction.  Monitor on low rate IV fluids.  Incentive spirometry.  Improved nutrition. Started Ensure.   PAT:  Continue with metoprolol/   HTN; Continue with metoprolol./  Hold Maxzide   Right knee total Arthroplasty: by Dr Rhona Raider 04/01/2022. Plan to change tramadol to tylenol.  Appreciate ortho follow up on patient.    Estimated body mass index is 27.93 kg/m as calculated from the following:   Height as of this encounter: 5' 7.5" (1.715 m).   Weight as of this encounter: 82.1 kg.   DVT prophylaxis: Lovenox Code Status: Full code Family Communication: Daughter at bedside.  Disposition Plan:  Status is: inpatient.     Consultants:    Procedures:  ECHO  Antimicrobials:    Subjective: She is still not feeling well, still having dizziness, constant, got worse on standing with PT>  Also complaints of nausea.  She doesn't have a pacemaker.  No further BM, or diarrhea since yesterday, denies abdominal pain.    Objective: Vitals:   04/11/22 2030 04/11/22 2101 04/12/22 0104 04/12/22 0519  BP: (!) 167/67 (!) 182/80 (!) 152/72 (!) 149/68  Pulse: 84 83 89 90  Resp: '19 18 18 18  '$ Temp: 98.1 F (36.7 C) 98.3 F (36.8 C) 98.2 F (36.8 C) 98.2 F (36.8 C)  TempSrc: Oral Oral Oral  Oral  SpO2: 94% 95% 93% 94%  Weight:      Height:        Intake/Output Summary (Last 24 hours) at 04/12/2022 0738 Last data filed at 04/12/2022 0517 Gross per 24 hour  Intake 1240 ml  Output 1600 ml  Net -360 ml   Filed Weights   04/11/22 1107  Weight: 82.1 kg    Examination:  General exam: Appears calm and comfortable  Respiratory system: Clear to auscultation. Respiratory effort normal. Cardiovascular  system: S1 & S2 heard, RRR. No pacemaker.  Gastrointestinal system: Abdomen is nondistended, soft and nontender. No organomegaly or masses felt. Normal bowel sounds heard. Central nervous system: Alert and oriented. No focal neurological deficits.  Extremities: Symmetric 5 x 5 power.    Data Reviewed: I have personally reviewed following labs and imaging studies  CBC: Recent Labs  Lab 04/11/22 1229 04/12/22 0556  WBC 12.3* 10.5  NEUTROABS 9.6* 7.4  HGB 12.1 10.9*  HCT 37.3 33.7*  MCV 95.2 95.5  PLT 421* 017   Basic Metabolic Panel: Recent Labs  Lab 04/11/22 1229  NA 135  K 3.5  CL 101  CO2 25  GLUCOSE 118*  BUN 7*  CREATININE 0.72  CALCIUM 8.2*   GFR: Estimated Creatinine Clearance: 61.4 mL/min (by C-G formula based on SCr of 0.72 mg/dL). Liver Function Tests: Recent Labs  Lab 04/11/22 1229  AST 17  ALT 12  ALKPHOS 86  BILITOT 0.8  PROT 6.3*  ALBUMIN 3.1*   No results for input(s): "LIPASE", "AMYLASE" in the last 168 hours. No results for input(s): "AMMONIA" in the last 168 hours. Coagulation Profile: No results for input(s): "INR", "PROTIME" in the last 168 hours. Cardiac Enzymes: No results for input(s): "CKTOTAL", "CKMB", "CKMBINDEX", "TROPONINI" in the last 168 hours. BNP (last 3 results) No results for input(s): "PROBNP" in the last 8760 hours. HbA1C: No results for input(s): "HGBA1C" in the last 72 hours. CBG: No results for input(s): "GLUCAP" in the last 168 hours. Lipid Profile: No results for input(s): "CHOL", "HDL", "LDLCALC", "TRIG", "CHOLHDL", "LDLDIRECT" in the last 72 hours. Thyroid Function Tests: No results for input(s): "TSH", "T4TOTAL", "FREET4", "T3FREE", "THYROIDAB" in the last 72 hours. Anemia Panel: No results for input(s): "VITAMINB12", "FOLATE", "FERRITIN", "TIBC", "IRON", "RETICCTPCT" in the last 72 hours. Sepsis Labs: No results for input(s): "PROCALCITON", "LATICACIDVEN" in the last 168 hours.  Recent Results (from the  past 240 hour(s))  C Difficile Quick Screen w PCR reflex     Status: None   Collection Time: 04/11/22  5:33 PM   Specimen: Stool  Result Value Ref Range Status   C Diff antigen NEGATIVE NEGATIVE Final   C Diff toxin NEGATIVE NEGATIVE Final   C Diff interpretation No C. difficile detected.  Final    Comment: Performed at [facility], [place] [address] [place], [place] [address]         Radiology Studies: CT ABDOMEN PELVIS W CONTRAST  Result Date: 04/11/2022 CLINICAL DATA:  Nausea/vomiting; Pulmonary embolism (PE) suspected, high prob SOB, hypoxia. Per EMS- Patient is 10 days post op-right knee replacement. Patient was constipated yesterday and took laxatives and was up several times to the bathroom. Patient is now c/o dizziness and weakness. EXAM: CT ANGIOGRAPHY CHEST CT ABDOMEN AND PELVIS WITH CONTRAST TECHNIQUE: Multidetector CT imaging of the chest was performed using the standard protocol during bolus administration of intravenous contrast. Multiplanar CT image reconstructions and MIPs were obtained to evaluate the vascular anatomy. Multidetector CT imaging  of the abdomen and pelvis was performed using the standard protocol during bolus administration of intravenous contrast. RADIATION DOSE REDUCTION: This exam was performed according to the departmental dose-optimization program which includes automated exposure control, adjustment of the mA and/or kV according to patient size and/or use of iterative reconstruction technique. CONTRAST:  183m OMNIPAQUE IOHEXOL 350 MG/ML SOLN COMPARISON:  None Available. FINDINGS: CTA CHEST FINDINGS Cardiovascular: Satisfactory opacification of the pulmonary arteries to the segmental level. No evidence of pulmonary embolism. Normal heart size. No significant pericardial effusion. The thoracic aorta is normal in caliber. No atherosclerotic plaque of the thoracic aorta. No coronary artery calcifications. Gas within the venous vasculature  consistent with intravenous access placement and IV contrast administration. Mediastinum/Nodes: Prominent but nonenlarged mediastinal lymph nodes. No enlarged mediastinal, hilar, or axillary lymph nodes. Thyroid gland, trachea, and esophagus demonstrate no significant findings. Lungs/Pleura: Bilateral lower lobe subsegmental atelectasis. Mild interlobular septal wall thickening of bilateral upper lobes. No focal consolidation. No pulmonary nodule. No pulmonary mass. Trace bilateral, left greater than right, pleural effusions. No pneumothorax. Diffuse mild bronchial wall thickening most prominent within the right upper lobe. Musculoskeletal: No chest wall abnormality. Bilateral breast implants peripherally calcified. No suspicious lytic or blastic osseous lesions. No acute displaced fracture. Multilevel degenerative changes of the spine. Review of the MIP images confirms the above findings. CT ABDOMEN and PELVIS FINDINGS Hepatobiliary: No focal liver abnormality. Status post cholecystectomy. No biliary dilatation. Pancreas: No focal lesion. Normal pancreatic contour. No surrounding inflammatory changes. No main pancreatic ductal dilatation. Spleen: Normal in size without focal abnormality. Adrenals/Urinary Tract: No adrenal nodule bilaterally. Bilateral kidneys enhance symmetrically. No hydronephrosis. No hydroureter. The urinary bladder is unremarkable. On delayed imaging, there is no urothelial wall thickening and there are no filling defects in the opacified portions of the bilateral collecting systems or ureters. Stomach/Bowel: Stomach is within normal limits. No evidence of small bowel wall thickening or dilatation. Descending and sigmoid diverticulosis. No definite large bowel wall thickening. Under-distended distal descending colon with slightly limited evaluation (5: 48-58). Stool within the ascending and transverse colon. Appendix appears normal. Vascular/Lymphatic: No abdominal aorta or iliac aneurysm.  Severe atherosclerotic plaque of the aorta and its branches. No abdominal, pelvic, or inguinal lymphadenopathy. Reproductive: Status post hysterectomy. No adnexal masses. Other: No intraperitoneal free fluid. No intraperitoneal free gas. No organized fluid collection. Musculoskeletal: No abdominal wall hernia or abnormality. No suspicious lytic or blastic osseous lesions. No acute displaced fracture. Multilevel degenerative changes of the spine. Review of the MIP images confirms the above findings. IMPRESSION: 1. No pulmonary embolus. 2. Mild pulmonary edema. Trace bilateral, left greater than right, pleural effusions. 3. Small airway disease. 4. Colonic diverticulosis with no definite colonic diverticulitis. Under distended distal descending colon with limited evaluation. Electronically Signed   By: MIven FinnM.D.   On: 04/11/2022 19:15   CT Angio Chest PE W and/or Wo Contrast  Result Date: 04/11/2022 CLINICAL DATA:  Nausea/vomiting; Pulmonary embolism (PE) suspected, high prob SOB, hypoxia. Per EMS- Patient is 10 days post op-right knee replacement. Patient was constipated yesterday and took laxatives and was up several times to the bathroom. Patient is now c/o dizziness and weakness. EXAM: CT ANGIOGRAPHY CHEST CT ABDOMEN AND PELVIS WITH CONTRAST TECHNIQUE: Multidetector CT imaging of the chest was performed using the standard protocol during bolus administration of intravenous contrast. Multiplanar CT image reconstructions and MIPs were obtained to evaluate the vascular anatomy. Multidetector CT imaging of the abdomen and pelvis was performed using the standard protocol during  bolus administration of intravenous contrast. RADIATION DOSE REDUCTION: This exam was performed according to the departmental dose-optimization program which includes automated exposure control, adjustment of the mA and/or kV according to patient size and/or use of iterative reconstruction technique. CONTRAST:  133m OMNIPAQUE  IOHEXOL 350 MG/ML SOLN COMPARISON:  None Available. FINDINGS: CTA CHEST FINDINGS Cardiovascular: Satisfactory opacification of the pulmonary arteries to the segmental level. No evidence of pulmonary embolism. Normal heart size. No significant pericardial effusion. The thoracic aorta is normal in caliber. No atherosclerotic plaque of the thoracic aorta. No coronary artery calcifications. Gas within the venous vasculature consistent with intravenous access placement and IV contrast administration. Mediastinum/Nodes: Prominent but nonenlarged mediastinal lymph nodes. No enlarged mediastinal, hilar, or axillary lymph nodes. Thyroid gland, trachea, and esophagus demonstrate no significant findings. Lungs/Pleura: Bilateral lower lobe subsegmental atelectasis. Mild interlobular septal wall thickening of bilateral upper lobes. No focal consolidation. No pulmonary nodule. No pulmonary mass. Trace bilateral, left greater than right, pleural effusions. No pneumothorax. Diffuse mild bronchial wall thickening most prominent within the right upper lobe. Musculoskeletal: No chest wall abnormality. Bilateral breast implants peripherally calcified. No suspicious lytic or blastic osseous lesions. No acute displaced fracture. Multilevel degenerative changes of the spine. Review of the MIP images confirms the above findings. CT ABDOMEN and PELVIS FINDINGS Hepatobiliary: No focal liver abnormality. Status post cholecystectomy. No biliary dilatation. Pancreas: No focal lesion. Normal pancreatic contour. No surrounding inflammatory changes. No main pancreatic ductal dilatation. Spleen: Normal in size without focal abnormality. Adrenals/Urinary Tract: No adrenal nodule bilaterally. Bilateral kidneys enhance symmetrically. No hydronephrosis. No hydroureter. The urinary bladder is unremarkable. On delayed imaging, there is no urothelial wall thickening and there are no filling defects in the opacified portions of the bilateral collecting  systems or ureters. Stomach/Bowel: Stomach is within normal limits. No evidence of small bowel wall thickening or dilatation. Descending and sigmoid diverticulosis. No definite large bowel wall thickening. Under-distended distal descending colon with slightly limited evaluation (5: 48-58). Stool within the ascending and transverse colon. Appendix appears normal. Vascular/Lymphatic: No abdominal aorta or iliac aneurysm. Severe atherosclerotic plaque of the aorta and its branches. No abdominal, pelvic, or inguinal lymphadenopathy. Reproductive: Status post hysterectomy. No adnexal masses. Other: No intraperitoneal free fluid. No intraperitoneal free gas. No organized fluid collection. Musculoskeletal: No abdominal wall hernia or abnormality. No suspicious lytic or blastic osseous lesions. No acute displaced fracture. Multilevel degenerative changes of the spine. Review of the MIP images confirms the above findings. IMPRESSION: 1. No pulmonary embolus. 2. Mild pulmonary edema. Trace bilateral, left greater than right, pleural effusions. 3. Small airway disease. 4. Colonic diverticulosis with no definite colonic diverticulitis. Under distended distal descending colon with limited evaluation. Electronically Signed   By: MIven FinnM.D.   On: 04/11/2022 19:15   CT Head Wo Contrast  Result Date: 04/11/2022 CLINICAL DATA:  Mental status change. Status post right knee replacement EXAM: CT HEAD WITHOUT CONTRAST TECHNIQUE: Contiguous axial images were obtained from the base of the skull through the vertex without intravenous contrast. RADIATION DOSE REDUCTION: This exam was performed according to the departmental dose-optimization program which includes automated exposure control, adjustment of the mA and/or kV according to patient size and/or use of iterative reconstruction technique. COMPARISON:  11/03/2017 FINDINGS: Brain: No evidence of acute infarction, hemorrhage, extra-axial collection, ventriculomegaly, or  mass effect. Old left frontal lobe infarct with ex vacuo dilatation of the left frontal horn of the lateral ventricle. Generalized cerebral atrophy. Periventricular white matter low attenuation likely secondary to microangiopathy. Vascular:  Cerebrovascular atherosclerotic calcifications are noted. Skull: Negative for fracture or focal lesion.  Bifrontal burr holes. Sinuses/Orbits: Visualized portions of the orbits are unremarkable. Visualized portions of the paranasal sinuses are unremarkable. Visualized portions of the mastoid air cells are unremarkable. Other: None. IMPRESSION: 1. No acute intracranial findings. 2. Old left frontal lobe infarct. 3. Chronic small vessel ischemic changes. Electronically Signed   By: Kathreen Devoid M.D.   On: 04/11/2022 13:12   DG Chest Port 1 View  Result Date: 04/11/2022 CLINICAL DATA:  Altered mental status. Weakness. Ten days postop right knee replacement. Dizziness and weakness. EXAM: PORTABLE CHEST 1 VIEW COMPARISON:  AP chest 02/14/2020; chest two views 06/10/2019 FINDINGS: Moderate atherosclerotic calcifications of the aortic arch. Cardiac silhouette and mediastinal contours are within normal limits. Note is made of bilateral breast implants with peripheral capsular calcifications. The left breast implant is likely the cause for density overlying the left lung base. There are bilateral upper lung lucencies compatible with emphysematous change. No definite pleural effusion. No pneumothorax. Moderate multilevel degenerative disc changes of the thoracic spine. IMPRESSION: Bilateral breast implants with peripheral capsular calcification are again seen. Associated density is likely the etiology of relatively increased density overlying the left lung base, due to the position of the breast implant on frontal view. This is similar to the prior comparison studies when portions of the breast implants overlie the lungs. No definite acute lung process. Electronically Signed   By:  Yvonne Kendall M.D.   On: 04/11/2022 12:56        Scheduled Meds:  amitriptyline  10 mg Oral BID   enoxaparin (LOVENOX) injection  40 mg Subcutaneous Q24H   famotidine  10 mg Oral BID   metoprolol succinate  50 mg Oral Daily   Continuous Infusions:   LOS: 0 days    Time spent: 35 minutes.     Elmarie Shiley, MD [facility]   If 7PM-7AM, please contact night-coverage [email or web address]  04/12/2022, 7:38 AM

## 2022-04-12 NOTE — Progress Notes (Signed)
2D Echocardiogram has been performed.  Jennifer Melendez M 04/12/2022, 9:55 AM

## 2022-04-12 NOTE — Evaluation (Signed)
Physical Therapy Evaluation Patient Details Name: Jennifer Melendez MRN: [id number] DOB: May 06, 1941 Today's Date: 04/12/2022  History of Present Illness  Pt is an 81 yo female presenting 04/11/22 with dizziness and weakness . 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  Pt currently with functional limitations due to the deficits listed below (see PT Problem List). Pt will benefit from skilled PT to increase their independence and safety with mobility to allow discharge to the venue listed below.  Daughter present for session and reports pt has really only been up to bathroom multiple times in the past few days and has not been able to work with Physical therapy (s/p R TKA 04/01/22).  Pt agreeable to attempt mobility with a little encouragement.  Obtained orthostatic vitals and pt too dizzy to get to recliner or ambulate today.  Anticipate good progress once pt's dizziness improves.  Orthostatic VS for the past 24 hrs:  BP- Lying Pulse- Lying BP- Sitting Pulse- Sitting BP- Standing at 0 minutes Pulse- Standing at 0 minutes  04/12/22 1015 168/70 77 176/81 88 151/90 84  04/11/22 1631 166/71 90 (!) 130/115 102 153/90 104  04/11/22 1618 166/71 90 (!) 130/115 102 153/90 104          Recommendations for follow up therapy are one component of a multi-disciplinary discharge planning process, led by the attending physician.  Recommendations may be updated based on patient status, additional functional criteria and insurance authorization.  Follow Up Recommendations Other (comment) (continue rehab for recent TKA)    Assistance Recommended at Discharge Frequent or constant Supervision/Assistance  Patient can return home with the following  A lot of help with walking and/or transfers;A lot of help with bathing/dressing/bathroom;Assistance with cooking/housework;Direct  supervision/assist for medications management;Assist for transportation;Help with stairs or ramp for entrance    Equipment Recommendations None recommended by PT  Recommendations for Other Services       Functional Status Assessment Patient has had a recent decline in their functional status and demonstrates the ability to make significant improvements in function in a reasonable and predictable amount of time.     Precautions / Restrictions Precautions Precautions: Fall;Knee Restrictions RLE Weight Bearing: Weight bearing as tolerated      Mobility  Bed Mobility Overal bed mobility: Needs Assistance Bed Mobility: Supine to Sit, Sit to Supine     Supine to sit: Min guard Sit to supine: Mod assist   General bed mobility comments: assist for LEs onto bed    Transfers Overall transfer level: Needs assistance Equipment used: Rolling walker (2 wheels) Transfers: Sit to/from Stand Sit to Stand: Min assist, +2 safety/equipment           General transfer comment: good hand placement, required assist for rise and controlling descent; pt only able to tolerate standing to get orthostatics and then requested return to bed    Ambulation/Gait               General Gait Details: pt feels too dizzy to mobilize  Stairs            Wheelchair Mobility    Modified Rankin (Stroke Patients Only)       Balance Overall balance assessment: Needs assistance         Standing balance support: Bilateral upper extremity supported, Reliant on assistive device for balance Standing balance-Leahy Scale: Poor  Pertinent Vitals/Pain Pain Assessment Pain Assessment: Faces Faces Pain Scale: Hurts a little bit Pain Location: right knee Pain Descriptors / Indicators: Sore Pain Intervention(s): Repositioned, Monitored during session    Home Living Family/patient expects to be discharged to:: Private residence Living Arrangements:  Children Available Help at Discharge: Family;Available 24 hours/day Type of Home: House Home Access: Stairs to enter Entrance Stairs-Rails: Left Entrance Stairs-Number of Steps: 6   Home Layout: One level Home Equipment: Toilet riser;Shower seat;Cane - single Barista (2 wheels) Additional Comments: has been recovering from TKA at daughter [employer]    Prior Function Prior Level of Function : Needs assist             Mobility Comments: uses RW since surgery, only really up to use bathroom since knee surgery       Hand Dominance        Extremity/Trunk Assessment        Lower Extremity Assessment RLE Deficits / Details: observed at least 90* knee flexion with pt sitting EOB, pt able to perform SLR       Communication   Communication: No difficulties  Cognition Arousal/Alertness: Awake/alert Behavior During Therapy: WFL for tasks assessed/performed Overall Cognitive Status: Within Functional Limits for tasks assessed                                 General Comments: hesitant to mobilize however to at least try standing to obtain orthostatics and with encouragement from daughter        General Comments      Exercises     Assessment/Plan    PT Assessment Patient needs continued PT services  PT Problem List Decreased strength;Decreased range of motion;Decreased activity tolerance;Decreased balance;Decreased mobility;Decreased coordination;Decreased knowledge of use of DME       PT Treatment Interventions DME instruction;Gait training;Stair training;Functional mobility training;Therapeutic activities;Therapeutic exercise;Balance training;Neuromuscular re-education;Patient/family education    PT Goals (Current goals can be found in the Care Plan section)  Acute Rehab PT Goals PT Goal Formulation: With patient/family Time For Goal Achievement: 04/26/22 Potential to Achieve Goals: Good    Frequency Min 3X/week      Co-evaluation               AM-PAC PT "6 Clicks" Mobility  Outcome Measure Help needed turning from your back to your side while in a flat bed without using bedrails?: None Help needed moving from lying on your back to sitting on the side of a flat bed without using bedrails?: A Lot Help needed moving to and from a bed to a chair (including a wheelchair)?: A Lot Help needed standing up from a chair using your arms (e.g., wheelchair or bedside chair)?: A Lot Help needed to walk in hospital room?: A Lot Help needed climbing 3-5 steps with a railing? : A Lot 6 Click Score: 14    End of Session Equipment Utilized During Treatment: Gait belt Activity Tolerance: Other (comment) (limited by dizziness) Patient left: with call bell/phone within reach;with family/visitor present;in bed;with bed alarm set   PT Visit Diagnosis: Unsteadiness on feet (R26.81);Other abnormalities of gait and mobility (R26.89)    Time: 8242-3536 PT Time Calculation (min) (ACUTE ONLY): 21 min   Charges:   PT Evaluation $PT Eval Low Complexity: 1 Low        Kati PT, DPT Acute Rehabilitation Services Pager: [phone] Office: [place] 04/12/2022, 1:17 PM

## 2022-04-13 DIAGNOSIS — R197 Diarrhea, unspecified: Secondary | ICD-10-CM | POA: Diagnosis not present

## 2022-04-13 LAB — CBC
HCT: 35.1 % — ABNORMAL LOW (ref 36.0–46.0)
Hemoglobin: 11.4 g/dL — ABNORMAL LOW (ref 12.0–15.0)
MCH: 30.7 pg (ref 26.0–34.0)
MCHC: 32.5 g/dL (ref 30.0–36.0)
MCV: 94.6 fL (ref 80.0–100.0)
Platelets: 378 10*3/uL (ref 150–400)
RBC: 3.71 MIL/uL — ABNORMAL LOW (ref 3.87–5.11)
RDW: 14.2 % (ref 11.5–15.5)
WBC: 9.8 10*3/uL (ref 4.0–10.5)
nRBC: 0 % (ref 0.0–0.2)

## 2022-04-13 LAB — BASIC METABOLIC PANEL
Anion gap: 7 (ref 5–15)
BUN: 6 mg/dL — ABNORMAL LOW (ref 8–23)
CO2: 26 mmol/L (ref 22–32)
Calcium: 8.7 mg/dL — ABNORMAL LOW (ref 8.9–10.3)
Chloride: 105 mmol/L (ref 98–111)
Creatinine, Ser: 0.68 mg/dL (ref 0.44–1.00)
GFR, Estimated: >60 mL/min (ref >60)
Glucose, Bld: 105 mg/dL — ABNORMAL HIGH (ref 70–99)
Potassium: 3.5 mmol/L (ref 3.5–5.1)
Sodium: 138 mmol/L (ref 135–145)

## 2022-04-13 MED ORDER — DIPHENHYDRAMINE HCL 25 MG PO CAPS
25.0000 mg | ORAL_CAPSULE | Freq: Every evening | ORAL | Status: DC | PRN
  Administered 2022-04-13: 25 mg via ORAL
  Filled 2022-04-13: qty 1

## 2022-04-13 MED ORDER — MECLIZINE HCL 25 MG PO TABS
25.0000 mg | ORAL_TABLET | Freq: Three times a day (TID) | ORAL | Status: DC | PRN
  Administered 2022-04-13 – 2022-04-14 (×3): 25 mg via ORAL
  Filled 2022-04-13 (×3): qty 1

## 2022-04-13 NOTE — Progress Notes (Signed)
     Jennifer Melendez is a 81 y.o. female   Orthopaedic diagnosis: End-stage arthritis of the right knee   Surgery: Right total knee arthroplasty by Jennifer Melendez 04/01/2022  Subjective: Patient appears comfortable in bed.  Family at bedside.  Continues to experience dizziness.  She states this is mostly exacerbated with movement of the head and neck.  She feels at times the room is spinning.  She has not been ambulatory due to her symptoms.  It appears medical workup for etiology to dizziness is overall benign thus far.  She has transitioned to Tylenol for postoperative pain.  She states pain is reasonably controlled. She is hoping to go home soon to the care of her daughter, who is present throughout the encounter.  No new concerns.  Objectyive: Vitals:   04/12/22 2111 04/13/22 0501  BP: (!) 179/72 (!) 160/81  Pulse: 73 79  Resp: 18 18  Temp: 98 F (36.7 C) 97.7 F (36.5 C)  SpO2: 92% 92%     Exam: Awake and alert Respirations even and unlabored No acute distress  Left knee shows intact surgical dressing. This was not removed.  Mild appropriate postoperative swelling about the knee.  No deformity or ecchymosis.  Range of motion is approximately 5-75 of knee flexion.  She actively flex and extend the knee without significant discomfort.  Calves are soft and nontender.  Neurovascularly intact distally.  Assessment: Status post right TKR 04/01/2022  Plan: Patient continues to experience dizziness.  This appears to exacerbated with head movement.  He has received IV fluids, diarrhea has stopped, and she has underwent medical workup that has not yielded a cause for her symptomatology.  Discussed with Jennifer Melendez.  There may be vertigo component to her symptoms.  Plan to try Antivert and undergo positional workup for vertigo by PT. Hopefully this will be helpful.  Will simply continue only Tylenol for postoperative pain. She appears to be doing well and as expected a few weeks from a  total knee replacement from an orthopedic standpoint.  She is suitable for discharge from orthopedic standpoint once medically stable and cleared by physical therapy.  Plan for outpatient follow-up with Jennifer Melendez at [facility] orthopedics in terms of her right total knee replacement.   Jennifer Eckert J. [place], PA-C

## 2022-04-13 NOTE — Progress Notes (Signed)
PROGRESS NOTE    Jennifer Melendez  MWU:[id number] DOB: [birth year] DOA: 04/11/2022 PCP: Robyne Peers, MD   Brief Narrative: 81 year old with past medical history significant for Barrett's Esophagus, hyperlipidemia, hypertension, paroxysmal atrial tachycardia who was hospitalized for total right knee arthroplasty on 6/6 by Dr. Rhona Raider.  He has been taking tramadol for as needed for postsurgical pain.  She developed constipation and was a started on lactulose 6/15.  Since then she has been having frequent bouts of diarrhea, innumerable over the last 24 hours.  She reported generalized abdominal cramping, nausea.  He is also complaining of weakness, lightheadedness and episode of near loss of consciousness. Evaluation in the ED: Patient received 2 L of IV fluids, she was not able to ambulate due to dizziness.  CT abdomen and pelvis negative for infectious process.  CT angio chest negative for PE, did reveal mild pulmonary edema and trace bilateral effusion.  Patient was admitted for further evaluation.   Assessment & Plan:   Principal Problem:   Acute diarrhea Active Problems:   Postural dizziness with presyncope   PAT (paroxysmal atrial tachycardia) (HCC)   Pulmonary edema   Essential hypertension   GERD (gastroesophageal reflux disease)  1-Acute Diarrhea: Post  Laxative: -Denies further bowel movement since yesterday.  She still feels the  needs to have more  bowel movement.  She was having cramping abdominal pain yesterday when she was having diarrhea.  -Plan to hold Laxatives.  -CT abdomen pelvis negative.  -C diff and GI pathogen negative.  -Resolved.   2-Near Syncope, Dizziness;  Orthostatic Vital mildly positive on admission, after she received fluids.  She report some nausea  even prior to diarrhea. Diarrhea illness made it worse.  Received IV fluids. NSL.  MRI negative for stroke.  Plan to discontinue tramadol to see if that will help with nausea, dizziness.  PT  evaluation for vestibular etiology.  Meclizine PRN for dizziness.  She has been taking benadryl for long time. She stop taking benadryl prior to Knee surgery. She could have some rebound dizziness from stopping benadryl. Plan to resume benadryl PRN, she will need to be wean off over time.   3-Pulmonary edema on CT: Probably after fluids received in the ED>  ECHO: Ef 60 %, grade 2 diastolic dysfunction.  Incentive spirometry.  Improved nutrition. Started Ensure.  IV fluids discontinue.   PAT:  Continue with metoprolol/   HTN; Continue with metoprolol./  Hold Maxzide, she was taking this medication PRN.  She will need close follow up with PCP for further adjustment of medications.    Right knee total Arthroplasty: by Dr Rhona Raider 04/01/2022. Plan to change tramadol to tylenol.  Appreciate ortho follow up on patient.    Estimated body mass index is 27.93 kg/m as calculated from the following:   Height as of this encounter: 5' 7.5" (1.715 m).   Weight as of this encounter: 82.1 kg.   DVT prophylaxis: Lovenox Code Status: Full code Family Communication: Daughter at bedside.  Disposition Plan:  Status is: inpatient.     Consultants:    Procedures:  ECHO  Antimicrobials:    Subjective: She report still having dizziness, and nausea.  We discussed about using meclizine PRN.  PT for vestibular evaluation. She is breathing well.    Objective: Vitals:   04/12/22 1405 04/12/22 2111 04/13/22 0501 04/13/22 1358  BP: (!) 157/59 (!) 179/72 (!) 160/81 (!) 168/74  Pulse: 88 73 79 76  Resp: '18 18 18 18  '$ Temp: 98  F (36.7 C) 98 F (36.7 C) 97.7 F (36.5 C) 97.8 F (36.6 C)  TempSrc: Oral Oral Oral Oral  SpO2: 95% 92% 92% 96%  Weight:      Height:        Intake/Output Summary (Last 24 hours) at 04/13/2022 1501 Last data filed at 04/13/2022 0500 Gross per 24 hour  Intake 758.33 ml  Output 900 ml  Net -141.67 ml    Filed Weights   04/11/22 1107  Weight: 82.1 kg     Examination:  General exam: NAD Respiratory system: CTA Cardiovascular system: S 1, S 2 RRR Gastrointestinal system: BS present, soft, nt Central nervous system: Non focal.  Extremities: Symmetric edema    Data Reviewed: I have personally reviewed following labs and imaging studies  CBC: Recent Labs  Lab 04/11/22 1229 04/12/22 0556 04/13/22 0550  WBC 12.3* 10.5 9.8  NEUTROABS 9.6* 7.4  --   HGB 12.1 10.9* 11.4*  HCT 37.3 33.7* 35.1*  MCV 95.2 95.5 94.6  PLT 421* 392 161    Basic Metabolic Panel: Recent Labs  Lab 04/11/22 1229 04/12/22 0556 04/13/22 0550  NA 135 139 138  K 3.5 3.7 3.5  CL 101 107 105  CO2 '25 26 26  '$ GLUCOSE 118* 103* 105*  BUN 7* 5* 6*  CREATININE 0.72 0.64 0.68  CALCIUM 8.2* 8.0* 8.7*  MG  --  2.4  --     GFR: Estimated Creatinine Clearance: 61.4 mL/min (by C-G formula based on SCr of 0.68 mg/dL). Liver Function Tests: Recent Labs  Lab 04/11/22 1229 04/12/22 0556  AST 17 16  ALT 12 13  ALKPHOS 86 74  BILITOT 0.8 0.6  PROT 6.3* 5.6*  ALBUMIN 3.1* 2.8*    No results for input(s): "LIPASE", "AMYLASE" in the last 168 hours. No results for input(s): "AMMONIA" in the last 168 hours. Coagulation Profile: No results for input(s): "INR", "PROTIME" in the last 168 hours. Cardiac Enzymes: No results for input(s): "CKTOTAL", "CKMB", "CKMBINDEX", "TROPONINI" in the last 168 hours. BNP (last 3 results) No results for input(s): "PROBNP" in the last 8760 hours. HbA1C: No results for input(s): "HGBA1C" in the last 72 hours. CBG: No results for input(s): "GLUCAP" in the last 168 hours. Lipid Profile: No results for input(s): "CHOL", "HDL", "LDLCALC", "TRIG", "CHOLHDL", "LDLDIRECT" in the last 72 hours. Thyroid Function Tests: No results for input(s): "TSH", "T4TOTAL", "FREET4", "T3FREE", "THYROIDAB" in the last 72 hours. Anemia Panel: No results for input(s): "VITAMINB12", "FOLATE", "FERRITIN", "TIBC", "IRON", "RETICCTPCT" in the last 72  hours. Sepsis Labs: No results for input(s): "PROCALCITON", "LATICACIDVEN" in the last 168 hours.  Recent Results (from the past 240 hour(s))  Gastrointestinal Panel by PCR , Stool     Status: None   Collection Time: 04/11/22  5:33 PM   Specimen: Stool  Result Value Ref Range Status   Campylobacter species NOT DETECTED NOT DETECTED Final   Plesimonas shigelloides NOT DETECTED NOT DETECTED Final   Salmonella species NOT DETECTED NOT DETECTED Final   Yersinia enterocolitica NOT DETECTED NOT DETECTED Final   Vibrio species NOT DETECTED NOT DETECTED Final   Vibrio cholerae NOT DETECTED NOT DETECTED Final   Enteroaggregative E coli (EAEC) NOT DETECTED NOT DETECTED Final   Enteropathogenic E coli (EPEC) NOT DETECTED NOT DETECTED Final   Enterotoxigenic E coli (ETEC) NOT DETECTED NOT DETECTED Final   Shiga like toxin producing E coli (STEC) NOT DETECTED NOT DETECTED Final   Shigella/Enteroinvasive E coli (EIEC) NOT DETECTED NOT DETECTED Final  Cryptosporidium NOT DETECTED NOT DETECTED Final   Cyclospora cayetanensis NOT DETECTED NOT DETECTED Final   Entamoeba histolytica NOT DETECTED NOT DETECTED Final   Giardia lamblia NOT DETECTED NOT DETECTED Final   Adenovirus F40/41 NOT DETECTED NOT DETECTED Final   Astrovirus NOT DETECTED NOT DETECTED Final   Norovirus GI/GII NOT DETECTED NOT DETECTED Final   Rotavirus A NOT DETECTED NOT DETECTED Final   Sapovirus (I, II, IV, and V) NOT DETECTED NOT DETECTED Final    Comment: Performed at [facility], [address] [place], [place] [address]  C Difficile Quick Screen w PCR reflex     Status: None   Collection Time: 04/11/22  5:33 PM   Specimen: Stool  Result Value Ref Range Status   C Diff antigen NEGATIVE NEGATIVE Final   C Diff toxin NEGATIVE NEGATIVE Final   C Diff interpretation No C. difficile detected.  Final    Comment: Performed at [facility], [place] [address] [place],  [address]          Radiology Studies: MR BRAIN WO CONTRAST  Result Date: 04/12/2022 CLINICAL DATA:  Dizziness, nonspecific.  Status post knee surgery. EXAM: MRI HEAD WITHOUT CONTRAST TECHNIQUE: Multiplanar, multiecho pulse sequences of the brain and surrounding structures were obtained without intravenous contrast. COMPARISON:  CT head without contrast 04/11/2022. 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 Expected dilation of the left lateral ventricle noted. Atrophy and white matter changes are otherwise mildly advanced for age. No acute infarct, hemorrhage, or mass lesion is present. No significant extraaxial fluid collection is present. The internal auditory canals are within normal limits. The brainstem and cerebellum are within normal limits. Vascular: Flow is present in the major intracranial arteries. Skull and upper cervical spine: The craniocervical junction is normal. Upper cervical spine is within normal limits. Marrow signal is unremarkable. Sinuses/Orbits: Chronic opacification of the left frontal sinus noted. The paranasal sinuses and mastoid air cells are otherwise clear. Bilateral lens replacements are noted. Globes and orbits are otherwise unremarkable. IMPRESSION: 1. No acute intracranial abnormality or significant interval change. 2. Stable encephalomalacia of the anterior left frontal lobe. 3. Atrophy and white matter disease is mildly advanced for age. This likely reflects the sequela of chronic microvascular ischemia. 4. Chronic left frontal sinus disease. Electronically Signed   By: San Morelle M.D.   On: 04/12/2022 18:57   ECHOCARDIOGRAM COMPLETE  Result Date: 04/12/2022    ECHOCARDIOGRAM REPORT   Patient Name:   Jennifer Melendez Date of Exam: 04/12/2022 Medical Rec #:  [id number]          Height:       67.5 in Accession #:    [id number]         Weight:       181.0 lb Date of Birth:  04-Feb-1941           BSA:          1.949 m Patient Age:    69 years           BP:           157/67 mmHg Patient Gender: F                  HR:           81 bpm. Exam Location:  Inpatient Procedure: 2D Echo, Cardiac Doppler and Color Doppler Indications:    CHF-Acute Systolic D32.67  History:  Patient has prior history of Echocardiogram examinations, most                 recent 02/29/2020. Arrythmias:PVC and PAC; Risk                 Factors:Hypertension and Dyslipidemia.  Sonographer:    Darlina Sicilian RDCS Referring Phys: [id number] [place]  Sonographer Comments: Image acquisition challenging due to breast implants. Patient declined the use of definity IMPRESSIONS  1. Left ventricular ejection fraction, by estimation, is 60 to 65%. The left ventricle has normal function. The left ventricle has no regional wall motion abnormalities. Left ventricular diastolic parameters are consistent with Grade II diastolic dysfunction (pseudonormalization). Elevated left ventricular end-diastolic pressure.  2. Right ventricular systolic function is normal. The right ventricular size is normal. There is moderately elevated pulmonary artery systolic pressure.  3. The mitral valve is normal in structure. No evidence of mitral valve regurgitation. No evidence of mitral stenosis.  4. The aortic valve is tricuspid. Aortic valve regurgitation is not visualized. No aortic stenosis is present.  5. The inferior vena cava is dilated in size with <50% respiratory variability, suggesting right atrial pressure of 15 mmHg. FINDINGS  Left Ventricle: Left ventricular ejection fraction, by estimation, is 60 to 65%. The left ventricle has normal function. The left ventricle has no regional wall motion abnormalities. The left ventricular internal cavity size was normal in size. There is  no left ventricular hypertrophy. Left ventricular diastolic parameters are consistent with Grade II diastolic dysfunction (pseudonormalization). Elevated left ventricular  end-diastolic pressure. Right Ventricle: The right ventricular size is normal. No increase in right ventricular wall thickness. Right ventricular systolic function is normal. There is moderately elevated pulmonary artery systolic pressure. The tricuspid regurgitant velocity is 3.34 m/s, and with an assumed right atrial pressure of 15 mmHg, the estimated right ventricular systolic pressure is 31.5 mmHg. Left Atrium: Left atrial size was normal in size. Right Atrium: Right atrial size was normal in size. Pericardium: There is no evidence of pericardial effusion. Mitral Valve: The mitral valve is normal in structure. No evidence of mitral valve regurgitation. No evidence of mitral valve stenosis. Tricuspid Valve: The tricuspid valve is normal in structure. Tricuspid valve regurgitation is mild . No evidence of tricuspid stenosis. Aortic Valve: The aortic valve is tricuspid. Aortic valve regurgitation is not visualized. No aortic stenosis is present. Pulmonic Valve: The pulmonic valve was normal in structure. Pulmonic valve regurgitation is not visualized. No evidence of pulmonic stenosis. Aorta: The aortic root is normal in size and structure. Venous: The inferior vena cava is dilated in size with less than 50% respiratory variability, suggesting right atrial pressure of 15 mmHg. IAS/Shunts: No atrial level shunt detected by color flow Doppler.  LEFT VENTRICLE PLAX 2D LVIDd:         3.90 cm   Diastology LVIDs:         2.50 cm   LV e' medial:    5.55 cm/s LV PW:         0.90 cm   LV E/e' medial:  17.6 LV IVS:        0.90 cm   LV e' lateral:   5.11 cm/s LVOT diam:     1.70 cm   LV E/e' lateral: 19.2 LV SV:         33 LV SV Index:   17 LVOT Area:     2.27 cm  RIGHT VENTRICLE TAPSE (M-mode): 2.5 cm LEFT ATRIUM  Index LA diam:      3.40 cm 1.74 cm/m LA Vol (A4C): 34.6 ml 17.75 ml/m  AORTIC VALVE LVOT Vmax:   83.90 cm/s LVOT Vmean:  57.500 cm/s LVOT VTI:    0.146 m  AORTA Ao Root diam: 2.70 cm Ao Asc diam:  2.60  cm MITRAL VALVE               TRICUSPID VALVE MV Area (PHT): 6.60 cm    TR Peak grad:   44.6 mmHg MV Decel Time: 115 msec    TR Vmax:        334.00 cm/s MV E velocity: 97.90 cm/s MV A velocity: 93.00 cm/s  SHUNTS MV E/A ratio:  1.05        Systemic VTI:  0.15 m                            Systemic Diam: 1.70 cm Skeet Latch MD Electronically signed by Skeet Latch MD Signature Date/Time: 04/12/2022/1:12:25 PM    Final    CT ABDOMEN PELVIS W CONTRAST  Result Date: 04/11/2022 CLINICAL DATA:  Nausea/vomiting; Pulmonary embolism (PE) suspected, high prob SOB, hypoxia. Per EMS- Patient is 10 days post op-right knee replacement. Patient was constipated yesterday and took laxatives and was up several times to the bathroom. Patient is now c/o dizziness and weakness. EXAM: CT ANGIOGRAPHY CHEST CT ABDOMEN AND PELVIS WITH CONTRAST TECHNIQUE: Multidetector CT imaging of the chest was performed using the standard protocol during bolus administration of intravenous contrast. Multiplanar CT image reconstructions and MIPs were obtained to evaluate the vascular anatomy. Multidetector CT imaging of the abdomen and pelvis was performed using the standard protocol during bolus administration of intravenous contrast. RADIATION DOSE REDUCTION: This exam was performed according to the departmental dose-optimization program which includes automated exposure control, adjustment of the mA and/or kV according to patient size and/or use of iterative reconstruction technique. CONTRAST:  139m OMNIPAQUE IOHEXOL 350 MG/ML SOLN COMPARISON:  None Available. FINDINGS: CTA CHEST FINDINGS Cardiovascular: Satisfactory opacification of the pulmonary arteries to the segmental level. No evidence of pulmonary embolism. Normal heart size. No significant pericardial effusion. The thoracic aorta is normal in caliber. No atherosclerotic plaque of the thoracic aorta. No coronary artery calcifications. Gas within the venous vasculature consistent with  intravenous access placement and IV contrast administration. Mediastinum/Nodes: Prominent but nonenlarged mediastinal lymph nodes. No enlarged mediastinal, hilar, or axillary lymph nodes. Thyroid gland, trachea, and esophagus demonstrate no significant findings. Lungs/Pleura: Bilateral lower lobe subsegmental atelectasis. Mild interlobular septal wall thickening of bilateral upper lobes. No focal consolidation. No pulmonary nodule. No pulmonary mass. Trace bilateral, left greater than right, pleural effusions. No pneumothorax. Diffuse mild bronchial wall thickening most prominent within the right upper lobe. Musculoskeletal: No chest wall abnormality. Bilateral breast implants peripherally calcified. No suspicious lytic or blastic osseous lesions. No acute displaced fracture. Multilevel degenerative changes of the spine. Review of the MIP images confirms the above findings. CT ABDOMEN and PELVIS FINDINGS Hepatobiliary: No focal liver abnormality. Status post cholecystectomy. No biliary dilatation. Pancreas: No focal lesion. Normal pancreatic contour. No surrounding inflammatory changes. No main pancreatic ductal dilatation. Spleen: Normal in size without focal abnormality. Adrenals/Urinary Tract: No adrenal nodule bilaterally. Bilateral kidneys enhance symmetrically. No hydronephrosis. No hydroureter. The urinary bladder is unremarkable. On delayed imaging, there is no urothelial wall thickening and there are no filling defects in the opacified portions of the bilateral collecting systems or ureters. Stomach/Bowel: Stomach is  within normal limits. No evidence of small bowel wall thickening or dilatation. Descending and sigmoid diverticulosis. No definite large bowel wall thickening. Under-distended distal descending colon with slightly limited evaluation (5: 48-58). Stool within the ascending and transverse colon. Appendix appears normal. Vascular/Lymphatic: No abdominal aorta or iliac aneurysm. Severe  atherosclerotic plaque of the aorta and its branches. No abdominal, pelvic, or inguinal lymphadenopathy. Reproductive: Status post hysterectomy. No adnexal masses. Other: No intraperitoneal free fluid. No intraperitoneal free gas. No organized fluid collection. Musculoskeletal: No abdominal wall hernia or abnormality. No suspicious lytic or blastic osseous lesions. No acute displaced fracture. Multilevel degenerative changes of the spine. Review of the MIP images confirms the above findings. IMPRESSION: 1. No pulmonary embolus. 2. Mild pulmonary edema. Trace bilateral, left greater than right, pleural effusions. 3. Small airway disease. 4. Colonic diverticulosis with no definite colonic diverticulitis. Under distended distal descending colon with limited evaluation. Electronically Signed   By: Iven Finn M.D.   On: 04/11/2022 19:15   CT Angio Chest PE W and/or Wo Contrast  Result Date: 04/11/2022 CLINICAL DATA:  Nausea/vomiting; Pulmonary embolism (PE) suspected, high prob SOB, hypoxia. Per EMS- Patient is 10 days post op-right knee replacement. Patient was constipated yesterday and took laxatives and was up several times to the bathroom. Patient is now c/o dizziness and weakness. EXAM: CT ANGIOGRAPHY CHEST CT ABDOMEN AND PELVIS WITH CONTRAST TECHNIQUE: Multidetector CT imaging of the chest was performed using the standard protocol during bolus administration of intravenous contrast. Multiplanar CT image reconstructions and MIPs were obtained to evaluate the vascular anatomy. Multidetector CT imaging of the abdomen and pelvis was performed using the standard protocol during bolus administration of intravenous contrast. RADIATION DOSE REDUCTION: This exam was performed according to the departmental dose-optimization program which includes automated exposure control, adjustment of the mA and/or kV according to patient size and/or use of iterative reconstruction technique. CONTRAST:  127m OMNIPAQUE IOHEXOL  350 MG/ML SOLN COMPARISON:  None Available. FINDINGS: CTA CHEST FINDINGS Cardiovascular: Satisfactory opacification of the pulmonary arteries to the segmental level. No evidence of pulmonary embolism. Normal heart size. No significant pericardial effusion. The thoracic aorta is normal in caliber. No atherosclerotic plaque of the thoracic aorta. No coronary artery calcifications. Gas within the venous vasculature consistent with intravenous access placement and IV contrast administration. Mediastinum/Nodes: Prominent but nonenlarged mediastinal lymph nodes. No enlarged mediastinal, hilar, or axillary lymph nodes. Thyroid gland, trachea, and esophagus demonstrate no significant findings. Lungs/Pleura: Bilateral lower lobe subsegmental atelectasis. Mild interlobular septal wall thickening of bilateral upper lobes. No focal consolidation. No pulmonary nodule. No pulmonary mass. Trace bilateral, left greater than right, pleural effusions. No pneumothorax. Diffuse mild bronchial wall thickening most prominent within the right upper lobe. Musculoskeletal: No chest wall abnormality. Bilateral breast implants peripherally calcified. No suspicious lytic or blastic osseous lesions. No acute displaced fracture. Multilevel degenerative changes of the spine. Review of the MIP images confirms the above findings. CT ABDOMEN and PELVIS FINDINGS Hepatobiliary: No focal liver abnormality. Status post cholecystectomy. No biliary dilatation. Pancreas: No focal lesion. Normal pancreatic contour. No surrounding inflammatory changes. No main pancreatic ductal dilatation. Spleen: Normal in size without focal abnormality. Adrenals/Urinary Tract: No adrenal nodule bilaterally. Bilateral kidneys enhance symmetrically. No hydronephrosis. No hydroureter. The urinary bladder is unremarkable. On delayed imaging, there is no urothelial wall thickening and there are no filling defects in the opacified portions of the bilateral collecting systems or  ureters. Stomach/Bowel: Stomach is within normal limits. No evidence of small bowel wall thickening or  dilatation. Descending and sigmoid diverticulosis. No definite large bowel wall thickening. Under-distended distal descending colon with slightly limited evaluation (5: 48-58). Stool within the ascending and transverse colon. Appendix appears normal. Vascular/Lymphatic: No abdominal aorta or iliac aneurysm. Severe atherosclerotic plaque of the aorta and its branches. No abdominal, pelvic, or inguinal lymphadenopathy. Reproductive: Status post hysterectomy. No adnexal masses. Other: No intraperitoneal free fluid. No intraperitoneal free gas. No organized fluid collection. Musculoskeletal: No abdominal wall hernia or abnormality. No suspicious lytic or blastic osseous lesions. No acute displaced fracture. Multilevel degenerative changes of the spine. Review of the MIP images confirms the above findings. IMPRESSION: 1. No pulmonary embolus. 2. Mild pulmonary edema. Trace bilateral, left greater than right, pleural effusions. 3. Small airway disease. 4. Colonic diverticulosis with no definite colonic diverticulitis. Under distended distal descending colon with limited evaluation. Electronically Signed   By: Iven Finn M.D.   On: 04/11/2022 19:15        Scheduled Meds:  amitriptyline  10 mg Oral BID   enoxaparin (LOVENOX) injection  40 mg Subcutaneous Q24H   famotidine  10 mg Oral BID   feeding supplement  237 mL Oral BID BM   metoprolol succinate  50 mg Oral Daily   Continuous Infusions:   LOS: 1 day    Time spent: 35 minutes.     Elmarie Shiley, MD [facility]   If 7PM-7AM, please contact night-coverage [email or web address]  04/13/2022, 3:01 PM

## 2022-04-14 DIAGNOSIS — R197 Diarrhea, unspecified: Secondary | ICD-10-CM | POA: Diagnosis not present

## 2022-04-14 MED ORDER — DIPHENHYDRAMINE HCL 25 MG PO CAPS: 25.0000 mg | ORAL_CAPSULE | Freq: Every evening | ORAL | 0 refills | Status: AC | PRN

## 2022-04-14 MED ORDER — MECLIZINE HCL 25 MG PO TABS: 25.0000 mg | ORAL_TABLET | Freq: Three times a day (TID) | ORAL | 0 refills | Status: AC | PRN

## 2022-04-14 MED ORDER — TRIAMTERENE-HCTZ 37.5-25 MG PO TABS: 1.0000 | ORAL_TABLET | Freq: Every day | ORAL | 1 refills | Status: DC

## 2022-04-14 MED ORDER — ACETAMINOPHEN 500 MG PO TABS
500.0000 mg | ORAL_TABLET | Freq: Once | ORAL | Status: AC
  Administered 2022-04-14: 500 mg via ORAL
  Filled 2022-04-14: qty 1

## 2022-04-14 MED ORDER — TRIAMTERENE-HCTZ 37.5-25 MG PO TABS
1.0000 | ORAL_TABLET | Freq: Every day | ORAL | Status: DC
Start: 2022-04-14 — End: 2022-04-14
  Administered 2022-04-14: 1 via ORAL
  Filled 2022-04-14: qty 1

## 2022-04-14 NOTE — Discharge Instructions (Signed)
Take benadryl 25 mg every other day as needed for allergies for a week. Then take half table every other day as need for another week, then take twice a week as needed for allergies, then stop.

## 2022-04-14 NOTE — Plan of Care (Signed)
Patient discharged home via private vehicle with her daughter Jennifer Melendez following PT assessment for possible vertigo (negative), medication for elevated blood pressure, and re-check. Dr. Tyrell Antonio updated with PT results and blood pressure values and gave okay for discharge. Ivan Anchors, RN 04/14/22 4:46 PM

## 2022-04-14 NOTE — TOC Transition Note (Signed)
Transition of Care [facility]) - CM/SW Discharge Note   Patient Details  Name: Jennifer Melendez MRN: [id number] Date of Birth: April 07, 1941  Transition of Care [facility]) CM/SW Contact:  Vassie Moselle, LCSW Phone Number: 04/14/2022, 12:43 PM   Clinical Narrative:    Met with pt and confirmed plan to return home with HHPT. Pt is established with [facility] for [facility] services and these are to be resumed at discharge. Pt has no DME needs. No further TOC needs identified.    Final next level of care: [place] Barriers to Discharge: No Barriers Identified   Patient Goals and CMS Choice Patient states their goals for this hospitalization and ongoing recovery are:: Return home   Choice offered to / list presented to : Patient  Discharge Placement                       Discharge Plan and Services                DME Arranged: N/A         HH Arranged: PT HH Agency: [facility] Date [place]: 04/14/22 Time [place]: [place] Representative spoke with at [place]: [place] ([place]) Interventions     Readmission Risk Interventions    04/14/2022   12:41 PM  Readmission Risk Prevention Plan  Transportation Screening Complete  PCP or Specialist Appt within 5-7 Days Complete  Home Care Screening Complete  Medication Review (RN CM) Complete

## 2022-04-14 NOTE — Progress Notes (Signed)
Physical Therapy Treatment Patient Details Name: Jennifer Melendez MRN: [id number] DOB: 08/06/1941 Today's Date: 04/14/2022   History of Present Illness Pt is an 81 yo female presenting 04/11/22 with dizziness and weakness . 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  Symptoms are present upon sitting and standing.  Performed modified dix-hallpike and negative for nystagmus, and pt denies provoking symptoms.  Pt reports sometimes she has issues with 'full feeling' in her ears but typically related to her sinuses/allergies.  Pt denies any recent changes in vision or hearing.  Pt assisted with short distance ambulation, and pt and daughter feel ready for pt to d/c home and continue PT f/u with HHPT.    Recommendations for follow up therapy are one component of a multi-disciplinary discharge planning process, led by the attending physician.  Recommendations may be updated based on patient status, additional functional criteria and insurance authorization.  Follow Up Recommendations  Home health PT     Assistance Recommended at Discharge Frequent or constant Supervision/Assistance  Patient can return home with the following Assistance with cooking/housework;Direct supervision/assist for medications management;Assist for transportation;Help with stairs or ramp for entrance;A little help with walking and/or transfers;A little help with bathing/dressing/bathroom   Equipment Recommendations  None recommended by PT    Recommendations for Other Services       Precautions / Restrictions Precautions Precautions: Fall;Knee Restrictions RLE Weight Bearing: Weight bearing as tolerated     Mobility  Bed Mobility Overal bed mobility: Needs Assistance Bed Mobility: Supine to Sit, Sit to Supine     Supine to sit: Supervision Sit to supine: Supervision         Transfers Overall transfer level: Needs assistance Equipment used: Rolling walker (2 wheels) Transfers: Sit to/from Stand Sit to Stand: Min assist           General transfer comment: good hand placement, required assist for rise    Ambulation/Gait Ambulation/Gait assistance: Min guard Gait Distance (Feet): 60 Feet Assistive device: Rolling walker (2 wheels) Gait Pattern/deviations: Step-through pattern, Decreased stride length, Decreased stance time - right Gait velocity: decr     General Gait Details: distance limited by fatigue, pt reports mild "world moving" however able to tolerate; no unsteadiness or LOB observed   Chief Strategy Officer    Modified Rankin (Stroke Patients Only)       Balance                                            Cognition Arousal/Alertness: Awake/alert Behavior During Therapy: WFL for tasks assessed/performed Overall Cognitive Status: Within Functional Limits for tasks assessed                                          Exercises      General Comments        Pertinent Vitals/Pain Pain Assessment Pain Assessment: Faces Faces Pain Scale: Hurts a little bit Pain Location: right knee Pain Descriptors / Indicators: Sore, Tender Pain Intervention(s): Repositioned, Monitored during session    Home Living  Prior Function            PT Goals (current goals can now be found in the care plan section) Progress towards PT goals: Progressing toward goals    Frequency    Min 3X/week      PT Plan Current plan remains appropriate    Co-evaluation              AM-PAC PT "6 Clicks" Mobility   Outcome Measure  Help needed turning from your back to your side while in a flat bed without using bedrails?: None Help needed moving from lying on your back to sitting on the side of a flat bed without using bedrails?: A  Little Help needed moving to and from a bed to a chair (including a wheelchair)?: A Little Help needed standing up from a chair using your arms (e.g., wheelchair or bedside chair)?: A Little Help needed to walk in hospital room?: A Little Help needed climbing 3-5 steps with a railing? : A Lot 6 Click Score: 18    End of Session Equipment Utilized During Treatment: Gait belt Activity Tolerance: Patient tolerated treatment well Patient left: in bed;with call bell/phone within reach;with nursing/sitter in room;with family/visitor present Nurse Communication: Mobility status PT Visit Diagnosis: Other abnormalities of gait and mobility (R26.89)     Time: 5366-4403 PT Time Calculation (min) (ACUTE ONLY): 28 min  Charges:  $Gait Training: 8-22 mins $Physical Performance Test: 8-22 mins           Arlyce Dice, DPT Acute Rehabilitation Services Pager: [phone] Office: [place] 04/14/2022, 3:36 PM

## 2022-04-14 NOTE — Discharge Summary (Addendum)
Physician Discharge Summary   Patient: Jennifer Melendez MRN: [id number] DOB: 08-May-1941  Admit date:     04/11/2022  Discharge date: 04/14/22  Discharge Physician: Elmarie Shiley   PCP: Robyne Peers, MD   Recommendations at discharge:   Patient will need B-met to follow electrolytes.  Needs further adjustment of BP medication.  Follow up with Ortho post knee replacement.   Discharge Diagnoses: Principal Problem:   Acute diarrhea Active Problems:   Postural dizziness with presyncope   PAT (paroxysmal atrial tachycardia) (HCC)   Pulmonary edema   Essential hypertension   GERD (gastroesophageal reflux disease)  Resolved Problems:   * No resolved hospital problems. [facility] Course: 81 year old with past medical history significant for Barrett's Esophagus, hyperlipidemia, hypertension, paroxysmal atrial tachycardia who was hospitalized for total right knee arthroplasty on 6/6 by Dr. Rhona Raider.  He has been taking tramadol for as needed for postsurgical pain.  She developed constipation and was a started on lactulose 6/15.  Since then she has been having frequent bouts of diarrhea, innumerable over the last 24 hours.  She reported generalized abdominal cramping, nausea.  He is also complaining of weakness, lightheadedness and episode of near loss of consciousness. Evaluation in the ED: Patient received 2 L of IV fluids, she was not able to ambulate due to dizziness.  CT abdomen and pelvis negative for infectious process.  CT angio chest negative for PE, did reveal mild pulmonary edema and trace bilateral effusion.   Patient was admitted for further evaluation.  Assessment and Plan: 1-Acute Diarrhea: Post  Laxative: -Denies further bowel movement since yesterday.  She still feels the  needs to have more  bowel movement.  She was having cramping abdominal pain yesterday when she was having diarrhea.  -Plan to hold Laxatives.  -CT abdomen pelvis negative.  -C diff and GI  pathogen negative.  -Resolved.    2-Near Syncope, Dizziness;  Orthostatic Vital mildly positive on admission, after she received fluids.  She report some nausea  even prior to diarrhea. Diarrhea illness made it worse.  Received IV fluids. NSL.  MRI negative for stroke.  Plan to discontinue tramadol to see if that will help with nausea, dizziness.  PT evaluation for vestibular etiology.  Meclizine PRN for dizziness.  She has been taking benadryl for long time. She stop taking benadryl prior to Knee surgery. She could have some rebound dizziness from stopping benadryl. Plan to resume benadryl PRN, she will need to be wean off over time.  Dizziness is better. She is feeling better.  Report headaches, MRI negative., she will get Med for BP.    3-Pulmonary edema on CT: Probably after fluids received in the ED>  Clinically undetermined if acute or chronic.  ECHO: Ef 60 %, grade 2 diastolic dysfunction.  Incentive spirometry.  Improved nutrition. Started Ensure.  IV fluids discontinue.    PAT:  Continue with metoprolol/    HTN; Continue with metoprolol./  Hold Maxzide, she was taking this medication PRN.  She will need close follow up with PCP for further adjustment of medications.  BP elevated, resume HCTZ, Triamterene. Close follow up with PCP   Right knee total Arthroplasty: by Dr Rhona Raider 04/01/2022. Plan to change tramadol to tylenol.  Appreciate ortho follow up on patient.    Nutrition, Nausea; taste loss.  Report her taste is better. Eating a little more.     Estimated body mass index is 27.93 kg/m as calculated from the following:   Height as  of this encounter: 5' 7.5" (1.715 m).   Weight as of this encounter: 82.1 kg.        Consultants: Ortho  Procedures performed: None Disposition: Home Diet recommendation:  Discharge Diet Orders (From admission, onward)     Start     Ordered   04/14/22 0000  Diet - low sodium heart healthy        04/14/22 1227            Cardiac diet DISCHARGE MEDICATION: Allergies as of 04/14/2022       Reactions   Amlodipine Besylate Other (See Comments)   Unknown Maybe body shaking    Ciprofloxacin Other (See Comments)   Unknown reaction   Diltiazem Hcl Er Other (See Comments)   Shaking of arms, eyes not focusing, stuffy    Morphine Nausea And Vomiting, Other (See Comments)   Could not stand up   Morpholine Salicylate Other (See Comments)   Could not stand up   Prednisolone Other (See Comments)   Unknown reaction   Gabapentin Hives      Losartan Potassium Palpitations   Ofloxacin Other (See Comments)   "Felt like she was dying"   Prednisone Rash, Other (See Comments)   Facial numbness        Medication List     STOP taking these medications    dicyclomine 10 MG capsule Commonly known as: BENTYL   diphenhydrAMINE 25 MG tablet Commonly known as: BENADRYL Replaced by: diphenhydrAMINE 25 mg capsule   lactulose 10 GM/15ML solution Commonly known as: CHRONULAC   methocarbamol 500 MG tablet Commonly known as: ROBAXIN   tiZANidine 4 MG tablet Commonly known as: ZANAFLEX   traMADol 50 MG tablet Commonly known as: ULTRAM       TAKE these medications    acetaminophen 500 MG tablet Commonly known as: TYLENOL Take 500 mg by mouth 2 (two) times daily as needed (knee pain).   amitriptyline 10 MG tablet Commonly known as: ELAVIL Take 10 mg by mouth daily as needed (migraines/heart palpitations).   aspirin 81 MG chewable tablet Chew 1 tablet (81 mg total) by mouth 2 (two) times daily. For 2 weeks then once a day for 2 weeks for DVT prevention. What changed:  when to take this additional instructions   diclofenac Sodium 1 % Gel Commonly known as: VOLTAREN Apply 1 application  topically daily as needed (knee pain (non-surgical knee)).   diphenhydrAMINE 25 mg capsule Commonly known as: BENADRYL Take 1 capsule (25 mg total) by mouth at bedtime as needed for allergies. Replaces:  diphenhydrAMINE 25 MG tablet   famotidine 20 MG tablet Commonly known as: PEPCID Take 20-60 mg by mouth daily.   meclizine 25 MG tablet Commonly known as: ANTIVERT Take 1 tablet (25 mg total) by mouth 3 (three) times daily as needed for dizziness.   metoprolol succinate 50 MG 24 hr tablet Commonly known as: TOPROL-XL Take 50 mg by mouth every morning.   Pepcid Complete 10-800-165 MG chewable tablet Generic drug: famotidine-calcium carbonate-magnesium hydroxide Chew 1 tablet by mouth See admin instructions. Chew one tablet by mouth every morning, may also take one tablet in the afternoon as needed for acid reflux   pregabalin 100 MG capsule Commonly known as: LYRICA Take 100 mg by mouth 2 (two) times daily.   REFRESH OP Place 1 drop into both eyes daily as needed (dry eye).   triamterene-hydrochlorothiazide 37.5-25 MG tablet Commonly known as: MAXZIDE-25 Take 1 tablet by mouth daily. What changed:  when  to take this reasons to take this        Discharge Exam: Filed Weights   04/11/22 1107  Weight: 82.1 kg   General; NAD Lung; CTA  Condition at discharge: stable  The results of significant diagnostics from this hospitalization (including imaging, microbiology, ancillary and laboratory) are listed below for reference.   Imaging Studies: MR BRAIN WO CONTRAST  Result Date: 04/12/2022 CLINICAL DATA:  Dizziness, nonspecific.  Status post knee surgery. EXAM: MRI HEAD WITHOUT CONTRAST TECHNIQUE: Multiplanar, multiecho pulse sequences of the brain and surrounding structures were obtained without intravenous contrast. COMPARISON:  CT head without contrast 04/11/2022. 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 Expected dilation of the left lateral ventricle noted. Atrophy and white matter changes are otherwise mildly advanced for age. No acute infarct, hemorrhage, or mass lesion is present. No  significant extraaxial fluid collection is present. The internal auditory canals are within normal limits. The brainstem and cerebellum are within normal limits. Vascular: Flow is present in the major intracranial arteries. Skull and upper cervical spine: The craniocervical junction is normal. Upper cervical spine is within normal limits. Marrow signal is unremarkable. Sinuses/Orbits: Chronic opacification of the left frontal sinus noted. The paranasal sinuses and mastoid air cells are otherwise clear. Bilateral lens replacements are noted. Globes and orbits are otherwise unremarkable. IMPRESSION: 1. No acute intracranial abnormality or significant interval change. 2. Stable encephalomalacia of the anterior left frontal lobe. 3. Atrophy and white matter disease is mildly advanced for age. This likely reflects the sequela of chronic microvascular ischemia. 4. Chronic left frontal sinus disease. Electronically Signed   By: San Morelle M.D.   On: 04/12/2022 18:57   ECHOCARDIOGRAM COMPLETE  Result Date: 04/12/2022    ECHOCARDIOGRAM REPORT   Patient Name:   BRAYLYN EYE Date of Exam: 04/12/2022 Medical Rec #:  [id number]          Height:       67.5 in Accession #:    [id number]         Weight:       181.0 lb Date of Birth:  1941-03-01          BSA:          1.949 m Patient Age:    17 years           BP:           157/67 mmHg Patient Gender: F                  HR:           81 bpm. Exam Location:  Inpatient Procedure: 2D Echo, Cardiac Doppler and Color Doppler Indications:    CHF-Acute Systolic B35.32  History:        Patient has prior history of Echocardiogram examinations, most                 recent 02/29/2020. Arrythmias:PVC and PAC; Risk                 Factors:Hypertension and Dyslipidemia.  Sonographer:    Darlina Sicilian RDCS Referring Phys: [id number] [place]  Sonographer Comments: Image acquisition challenging due to breast implants. Patient declined the use of definity IMPRESSIONS  1. Left  ventricular ejection fraction, by estimation, is 60 to 65%. The left ventricle has normal function. The left ventricle has no regional wall motion abnormalities. Left ventricular diastolic parameters are consistent with Grade  II diastolic dysfunction (pseudonormalization). Elevated left ventricular end-diastolic pressure.  2. Right ventricular systolic function is normal. The right ventricular size is normal. There is moderately elevated pulmonary artery systolic pressure.  3. The mitral valve is normal in structure. No evidence of mitral valve regurgitation. No evidence of mitral stenosis.  4. The aortic valve is tricuspid. Aortic valve regurgitation is not visualized. No aortic stenosis is present.  5. The inferior vena cava is dilated in size with <50% respiratory variability, suggesting right atrial pressure of 15 mmHg. FINDINGS  Left Ventricle: Left ventricular ejection fraction, by estimation, is 60 to 65%. The left ventricle has normal function. The left ventricle has no regional wall motion abnormalities. The left ventricular internal cavity size was normal in size. There is  no left ventricular hypertrophy. Left ventricular diastolic parameters are consistent with Grade II diastolic dysfunction (pseudonormalization). Elevated left ventricular end-diastolic pressure. Right Ventricle: The right ventricular size is normal. No increase in right ventricular wall thickness. Right ventricular systolic function is normal. There is moderately elevated pulmonary artery systolic pressure. The tricuspid regurgitant velocity is 3.34 m/s, and with an assumed right atrial pressure of 15 mmHg, the estimated right ventricular systolic pressure is 09.6 mmHg. Left Atrium: Left atrial size was normal in size. Right Atrium: Right atrial size was normal in size. Pericardium: There is no evidence of pericardial effusion. Mitral Valve: The mitral valve is normal in structure. No evidence of mitral valve regurgitation. No evidence  of mitral valve stenosis. Tricuspid Valve: The tricuspid valve is normal in structure. Tricuspid valve regurgitation is mild . No evidence of tricuspid stenosis. Aortic Valve: The aortic valve is tricuspid. Aortic valve regurgitation is not visualized. No aortic stenosis is present. Pulmonic Valve: The pulmonic valve was normal in structure. Pulmonic valve regurgitation is not visualized. No evidence of pulmonic stenosis. Aorta: The aortic root is normal in size and structure. Venous: The inferior vena cava is dilated in size with less than 50% respiratory variability, suggesting right atrial pressure of 15 mmHg. IAS/Shunts: No atrial level shunt detected by color flow Doppler.  LEFT VENTRICLE PLAX 2D LVIDd:         3.90 cm   Diastology LVIDs:         2.50 cm   LV e' medial:    5.55 cm/s LV PW:         0.90 cm   LV E/e' medial:  17.6 LV IVS:        0.90 cm   LV e' lateral:   5.11 cm/s LVOT diam:     1.70 cm   LV E/e' lateral: 19.2 LV SV:         33 LV SV Index:   17 LVOT Area:     2.27 cm  RIGHT VENTRICLE TAPSE (M-mode): 2.5 cm LEFT ATRIUM           Index LA diam:      3.40 cm 1.74 cm/m LA Vol (A4C): 34.6 ml 17.75 ml/m  AORTIC VALVE LVOT Vmax:   83.90 cm/s LVOT Vmean:  57.500 cm/s LVOT VTI:    0.146 m  AORTA Ao Root diam: 2.70 cm Ao Asc diam:  2.60 cm MITRAL VALVE               TRICUSPID VALVE MV Area (PHT): 6.60 cm    TR Peak grad:   44.6 mmHg MV Decel Time: 115 msec    TR Vmax:        334.00 cm/s MV E  velocity: 97.90 cm/s MV A velocity: 93.00 cm/s  SHUNTS MV E/A ratio:  1.05        Systemic VTI:  0.15 m                            Systemic Diam: 1.70 cm Skeet Latch MD Electronically signed by Skeet Latch MD Signature Date/Time: 04/12/2022/1:12:25 PM    Final    CT ABDOMEN PELVIS W CONTRAST  Result Date: 04/11/2022 CLINICAL DATA:  Nausea/vomiting; Pulmonary embolism (PE) suspected, high prob SOB, hypoxia. Per EMS- Patient is 10 days post op-right knee replacement. Patient was constipated yesterday  and took laxatives and was up several times to the bathroom. Patient is now c/o dizziness and weakness. EXAM: CT ANGIOGRAPHY CHEST CT ABDOMEN AND PELVIS WITH CONTRAST TECHNIQUE: Multidetector CT imaging of the chest was performed using the standard protocol during bolus administration of intravenous contrast. Multiplanar CT image reconstructions and MIPs were obtained to evaluate the vascular anatomy. Multidetector CT imaging of the abdomen and pelvis was performed using the standard protocol during bolus administration of intravenous contrast. RADIATION DOSE REDUCTION: This exam was performed according to the departmental dose-optimization program which includes automated exposure control, adjustment of the mA and/or kV according to patient size and/or use of iterative reconstruction technique. CONTRAST:  115m OMNIPAQUE IOHEXOL 350 MG/ML SOLN COMPARISON:  None Available. FINDINGS: CTA CHEST FINDINGS Cardiovascular: Satisfactory opacification of the pulmonary arteries to the segmental level. No evidence of pulmonary embolism. Normal heart size. No significant pericardial effusion. The thoracic aorta is normal in caliber. No atherosclerotic plaque of the thoracic aorta. No coronary artery calcifications. Gas within the venous vasculature consistent with intravenous access placement and IV contrast administration. Mediastinum/Nodes: Prominent but nonenlarged mediastinal lymph nodes. No enlarged mediastinal, hilar, or axillary lymph nodes. Thyroid gland, trachea, and esophagus demonstrate no significant findings. Lungs/Pleura: Bilateral lower lobe subsegmental atelectasis. Mild interlobular septal wall thickening of bilateral upper lobes. No focal consolidation. No pulmonary nodule. No pulmonary mass. Trace bilateral, left greater than right, pleural effusions. No pneumothorax. Diffuse mild bronchial wall thickening most prominent within the right upper lobe. Musculoskeletal: No chest wall abnormality. Bilateral  breast implants peripherally calcified. No suspicious lytic or blastic osseous lesions. No acute displaced fracture. Multilevel degenerative changes of the spine. Review of the MIP images confirms the above findings. CT ABDOMEN and PELVIS FINDINGS Hepatobiliary: No focal liver abnormality. Status post cholecystectomy. No biliary dilatation. Pancreas: No focal lesion. Normal pancreatic contour. No surrounding inflammatory changes. No main pancreatic ductal dilatation. Spleen: Normal in size without focal abnormality. Adrenals/Urinary Tract: No adrenal nodule bilaterally. Bilateral kidneys enhance symmetrically. No hydronephrosis. No hydroureter. The urinary bladder is unremarkable. On delayed imaging, there is no urothelial wall thickening and there are no filling defects in the opacified portions of the bilateral collecting systems or ureters. Stomach/Bowel: Stomach is within normal limits. No evidence of small bowel wall thickening or dilatation. Descending and sigmoid diverticulosis. No definite large bowel wall thickening. Under-distended distal descending colon with slightly limited evaluation (5: 48-58). Stool within the ascending and transverse colon. Appendix appears normal. Vascular/Lymphatic: No abdominal aorta or iliac aneurysm. Severe atherosclerotic plaque of the aorta and its branches. No abdominal, pelvic, or inguinal lymphadenopathy. Reproductive: Status post hysterectomy. No adnexal masses. Other: No intraperitoneal free fluid. No intraperitoneal free gas. No organized fluid collection. Musculoskeletal: No abdominal wall hernia or abnormality. No suspicious lytic or blastic osseous lesions. No acute displaced fracture. Multilevel degenerative changes of the  spine. Review of the MIP images confirms the above findings. IMPRESSION: 1. No pulmonary embolus. 2. Mild pulmonary edema. Trace bilateral, left greater than right, pleural effusions. 3. Small airway disease. 4. Colonic diverticulosis with no  definite colonic diverticulitis. Under distended distal descending colon with limited evaluation. Electronically Signed   By: Iven Finn M.D.   On: 04/11/2022 19:15   CT Angio Chest PE W and/or Wo Contrast  Result Date: 04/11/2022 CLINICAL DATA:  Nausea/vomiting; Pulmonary embolism (PE) suspected, high prob SOB, hypoxia. Per EMS- Patient is 10 days post op-right knee replacement. Patient was constipated yesterday and took laxatives and was up several times to the bathroom. Patient is now c/o dizziness and weakness. EXAM: CT ANGIOGRAPHY CHEST CT ABDOMEN AND PELVIS WITH CONTRAST TECHNIQUE: Multidetector CT imaging of the chest was performed using the standard protocol during bolus administration of intravenous contrast. Multiplanar CT image reconstructions and MIPs were obtained to evaluate the vascular anatomy. Multidetector CT imaging of the abdomen and pelvis was performed using the standard protocol during bolus administration of intravenous contrast. RADIATION DOSE REDUCTION: This exam was performed according to the departmental dose-optimization program which includes automated exposure control, adjustment of the mA and/or kV according to patient size and/or use of iterative reconstruction technique. CONTRAST:  146m OMNIPAQUE IOHEXOL 350 MG/ML SOLN COMPARISON:  None Available. FINDINGS: CTA CHEST FINDINGS Cardiovascular: Satisfactory opacification of the pulmonary arteries to the segmental level. No evidence of pulmonary embolism. Normal heart size. No significant pericardial effusion. The thoracic aorta is normal in caliber. No atherosclerotic plaque of the thoracic aorta. No coronary artery calcifications. Gas within the venous vasculature consistent with intravenous access placement and IV contrast administration. Mediastinum/Nodes: Prominent but nonenlarged mediastinal lymph nodes. No enlarged mediastinal, hilar, or axillary lymph nodes. Thyroid gland, trachea, and esophagus demonstrate no  significant findings. Lungs/Pleura: Bilateral lower lobe subsegmental atelectasis. Mild interlobular septal wall thickening of bilateral upper lobes. No focal consolidation. No pulmonary nodule. No pulmonary mass. Trace bilateral, left greater than right, pleural effusions. No pneumothorax. Diffuse mild bronchial wall thickening most prominent within the right upper lobe. Musculoskeletal: No chest wall abnormality. Bilateral breast implants peripherally calcified. No suspicious lytic or blastic osseous lesions. No acute displaced fracture. Multilevel degenerative changes of the spine. Review of the MIP images confirms the above findings. CT ABDOMEN and PELVIS FINDINGS Hepatobiliary: No focal liver abnormality. Status post cholecystectomy. No biliary dilatation. Pancreas: No focal lesion. Normal pancreatic contour. No surrounding inflammatory changes. No main pancreatic ductal dilatation. Spleen: Normal in size without focal abnormality. Adrenals/Urinary Tract: No adrenal nodule bilaterally. Bilateral kidneys enhance symmetrically. No hydronephrosis. No hydroureter. The urinary bladder is unremarkable. On delayed imaging, there is no urothelial wall thickening and there are no filling defects in the opacified portions of the bilateral collecting systems or ureters. Stomach/Bowel: Stomach is within normal limits. No evidence of small bowel wall thickening or dilatation. Descending and sigmoid diverticulosis. No definite large bowel wall thickening. Under-distended distal descending colon with slightly limited evaluation (5: 48-58). Stool within the ascending and transverse colon. Appendix appears normal. Vascular/Lymphatic: No abdominal aorta or iliac aneurysm. Severe atherosclerotic plaque of the aorta and its branches. No abdominal, pelvic, or inguinal lymphadenopathy. Reproductive: Status post hysterectomy. No adnexal masses. Other: No intraperitoneal free fluid. No intraperitoneal free gas. No organized fluid  collection. Musculoskeletal: No abdominal wall hernia or abnormality. No suspicious lytic or blastic osseous lesions. No acute displaced fracture. Multilevel degenerative changes of the spine. Review of the MIP images confirms the above findings. IMPRESSION:  1. No pulmonary embolus. 2. Mild pulmonary edema. Trace bilateral, left greater than right, pleural effusions. 3. Small airway disease. 4. Colonic diverticulosis with no definite colonic diverticulitis. Under distended distal descending colon with limited evaluation. Electronically Signed   By: Iven Finn M.D.   On: 04/11/2022 19:15   CT Head Wo Contrast  Result Date: 04/11/2022 CLINICAL DATA:  Mental status change. Status post right knee replacement EXAM: CT HEAD WITHOUT CONTRAST TECHNIQUE: Contiguous axial images were obtained from the base of the skull through the vertex without intravenous contrast. RADIATION DOSE REDUCTION: This exam was performed according to the departmental dose-optimization program which includes automated exposure control, adjustment of the mA and/or kV according to patient size and/or use of iterative reconstruction technique. COMPARISON:  11/03/2017 FINDINGS: Brain: No evidence of acute infarction, hemorrhage, extra-axial collection, ventriculomegaly, or mass effect. Old left frontal lobe infarct with ex vacuo dilatation of the left frontal horn of the lateral ventricle. Generalized cerebral atrophy. Periventricular white matter low attenuation likely secondary to microangiopathy. Vascular: Cerebrovascular atherosclerotic calcifications are noted. Skull: Negative for fracture or focal lesion.  Bifrontal burr holes. Sinuses/Orbits: Visualized portions of the orbits are unremarkable. Visualized portions of the paranasal sinuses are unremarkable. Visualized portions of the mastoid air cells are unremarkable. Other: None. IMPRESSION: 1. No acute intracranial findings. 2. Old left frontal lobe infarct. 3. Chronic small vessel  ischemic changes. Electronically Signed   By: Kathreen Devoid M.D.   On: 04/11/2022 13:12   DG Chest Port 1 View  Result Date: 04/11/2022 CLINICAL DATA:  Altered mental status. Weakness. Ten days postop right knee replacement. Dizziness and weakness. EXAM: PORTABLE CHEST 1 VIEW COMPARISON:  AP chest 02/14/2020; chest two views 06/10/2019 FINDINGS: Moderate atherosclerotic calcifications of the aortic arch. Cardiac silhouette and mediastinal contours are within normal limits. Note is made of bilateral breast implants with peripheral capsular calcifications. The left breast implant is likely the cause for density overlying the left lung base. There are bilateral upper lung lucencies compatible with emphysematous change. No definite pleural effusion. No pneumothorax. Moderate multilevel degenerative disc changes of the thoracic spine. IMPRESSION: Bilateral breast implants with peripheral capsular calcification are again seen. Associated density is likely the etiology of relatively increased density overlying the left lung base, due to the position of the breast implant on frontal view. This is similar to the prior comparison studies when portions of the breast implants overlie the lungs. No definite acute lung process. Electronically Signed   By: Yvonne Kendall M.D.   On: 04/11/2022 12:56    Microbiology: Results for orders placed or performed during the hospital encounter of 04/11/22  Gastrointestinal Panel by PCR , Stool     Status: None   Collection Time: 04/11/22  5:33 PM   Specimen: Stool  Result Value Ref Range Status   Campylobacter species NOT DETECTED NOT DETECTED Final   Plesimonas shigelloides NOT DETECTED NOT DETECTED Final   Salmonella species NOT DETECTED NOT DETECTED Final   Yersinia enterocolitica NOT DETECTED NOT DETECTED Final   Vibrio species NOT DETECTED NOT DETECTED Final   Vibrio cholerae NOT DETECTED NOT DETECTED Final   Enteroaggregative E coli (EAEC) NOT DETECTED NOT DETECTED  Final   Enteropathogenic E coli (EPEC) NOT DETECTED NOT DETECTED Final   Enterotoxigenic E coli (ETEC) NOT DETECTED NOT DETECTED Final   Shiga like toxin producing E coli (STEC) NOT DETECTED NOT DETECTED Final   Shigella/Enteroinvasive E coli (EIEC) NOT DETECTED NOT DETECTED Final   Cryptosporidium NOT DETECTED NOT  DETECTED Final   Cyclospora cayetanensis NOT DETECTED NOT DETECTED Final   Entamoeba histolytica NOT DETECTED NOT DETECTED Final   Giardia lamblia NOT DETECTED NOT DETECTED Final   Adenovirus F40/41 NOT DETECTED NOT DETECTED Final   Astrovirus NOT DETECTED NOT DETECTED Final   Norovirus GI/GII NOT DETECTED NOT DETECTED Final   Rotavirus A NOT DETECTED NOT DETECTED Final   Sapovirus (I, II, IV, and V) NOT DETECTED NOT DETECTED Final    Comment: Performed at [facility], [place], [place] [address]  C Difficile Quick Screen w PCR reflex     Status: None   Collection Time: 04/11/22  5:33 PM   Specimen: Stool  Result Value Ref Range Status   C Diff antigen NEGATIVE NEGATIVE Final   C Diff toxin NEGATIVE NEGATIVE Final   C Diff interpretation No C. difficile detected.  Final    Comment: Performed at [facility], [place] [address], [place], [place] [address]    Labs: CBC: Recent Labs  Lab 04/11/22 1229 04/12/22 0556 04/13/22 0550  WBC 12.3* 10.5 9.8  NEUTROABS 9.6* 7.4  --   HGB 12.1 10.9* 11.4*  HCT 37.3 33.7* 35.1*  MCV 95.2 95.5 94.6  PLT 421* 392 383   Basic Metabolic Panel: Recent Labs  Lab 04/11/22 1229 04/12/22 0556 04/13/22 0550  NA 135 139 138  K 3.5 3.7 3.5  CL 101 107 105  CO2 _0 GLUCOSE 118* 103* 105*  BUN 7* 5* 6*  CREATININE 0.72 0.64 0.68  CALCIUM 8.2* 8.0* 8.7*  MG  --  2.4  --    Liver Function Tests: Recent Labs  Lab 04/11/22 1229 04/12/22 0556  AST 17 16  ALT 12 13  ALKPHOS 86 74  BILITOT 0.8 0.6  PROT 6.3* 5.6*  ALBUMIN 3.1* 2.8*   CBG: No results for input(s): "GLUCAP" in  the last 168 hours.  Discharge time spent: greater than 30 minutes.  Signed: Elmarie Shiley, MD [facility] 04/14/2022

## 2023-02-09 ENCOUNTER — Ambulatory Visit: Payer: Medicare Other | Attending: Cardiology | Admitting: Cardiology

## 2023-02-09 ENCOUNTER — Encounter: Payer: Self-pay | Admitting: Cardiology

## 2023-02-09 VITALS — BP 142/78 | HR 68

## 2023-02-09 DIAGNOSIS — I10 Essential (primary) hypertension: Secondary | ICD-10-CM | POA: Diagnosis not present

## 2023-02-09 DIAGNOSIS — Z79899 Other long term (current) drug therapy: Secondary | ICD-10-CM

## 2023-02-09 DIAGNOSIS — I491 Atrial premature depolarization: Secondary | ICD-10-CM | POA: Diagnosis not present

## 2023-02-09 DIAGNOSIS — I493 Ventricular premature depolarization: Secondary | ICD-10-CM

## 2023-02-09 NOTE — Patient Instructions (Signed)
Medication Instructions:  Your physician recommends that you continue on your current medications as directed. Please refer to the Current Medication list given to you today.  *If you need a refill on your cardiac medications before your next appointment, please call your pharmacy*   Lab Work: Your physician recommends that you have labs drawn today. BMET, Mag If you have labs (blood work) drawn today and your tests are completely normal, you will receive your results only by: MyChart Message (if you have MyChart) OR A paper copy in the mail If you have any lab test that is abnormal or we need to change your treatment, we will call you to review the results.   Testing/Procedures: None   Follow-Up: At [facility], you and your health needs are our priority.  As part of our continuing mission to provide you with exceptional heart care, we have created designated Provider Care Teams.  These Care Teams include your primary Cardiologist (physician) and Advanced Practice Providers (APPs -  Physician Assistants and Nurse Practitioners) who all work together to provide you with the care you need, when you need it.  We recommend signing up for the patient portal called "MyChart".  Sign up information is provided on this After Visit Summary.  MyChart is used to connect with patients for Virtual Visits (Telemedicine).  Patients are able to view lab/test results, encounter notes, upcoming appointments, etc.  Non-urgent messages can be sent to your provider as well.   To learn more about what you can do with MyChart, go to [email or web address].    Your next appointment:   6 month(s)  Provider:   Kardie Tobb, DO      

## 2023-02-09 NOTE — Progress Notes (Unsigned)
Cardiology Office Note:    Date:  02/10/2023   ID:  Jennifer Melendez, DOB Jul 01, 1941, MRN [id number]  PCP:  Angelica Chessman, MD  Cardiologist:  Thomasene Ripple, DO  Electrophysiologist:  None   Referring MD: Angelica Chessman, MD   " I am ok -my primary increase my metoprolol recently.   History of Present Illness:    Jennifer Melendez is a 82 y.o. female with a hx of hypertension, hyperlipidemia, PVCs, PACs and paroxysmal atrial tachycardia is here today for follow-up visit.  I saw the patient in December 2021 at that time I increased her Toprol-XL from 50 mg daily to 50 mg in the morning and 25 mg in the evening.  I did place a monitor on the patient again to reassess her burden for PACs which showed occasional PACs.  She was seen on 01/07/2021 at that time she was still having some palpitations so I advised to increase her metoprolol to succinate to 75 mg daily.  She did tell me that she did not actually increase this medication given the fact that she felt much better several days after her appointment.  Since her visit with me she was seen by by APP at which time he was cleared for surgery.  She is here today with her.  No complaints at this time.  Past Medical History:  Diagnosis Date   Acute sphenoidal sinusitis 06/28/2014   Last Assessment & Plan:  Formatting of this note might be different from the original. Pt will continue nasal steroid. Z pack sent to pharmacy.   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 Psychological condition is worsening. May need to do sleep study to r/o OSA check labs today Psychological condition  will be reassessed in 3 months.   Constipation    Fibromyalgia    Gastritis 10/07/2016   Headache    Hx of bilateral breast implants 04/06/2017    Hyperlipemia 05/26/2014   Last Assessment & Plan:  Formatting of this note might be different from the original. Lipid abnormalities are unchanged. Pt off cholesterol medicine Nutritional counseling was provided. and Pharmacotherapy as ordered. Lipids will be reassessed in 6 months. Formatting of this note might be different from the original. Last Assessment & Plan:  Lipid abnormalities are unchanged. Pt off cholesterol m   Hyperlipidemia    Hypertension 05/26/2014   Last Assessment & Plan:  Formatting of this note might be different from the original. Hypertension is improving with treatment. Continue current treatment regimen. Dietary sodium restriction. Regular aerobic exercise. Blood pressure will be reassessed in 4 months. Formatting of this note might be different from the original. Last Assessment & Plan:  Hypertension is improving with treatment. Conti   Meningioma 05/26/2014   Last Assessment & Plan:  Formatting of this note might be different from the original. Pt needs f/u brain MRI   Otalgia of left ear 04/16/2016   Other chest pain 06/10/2019   PAC (premature atrial contraction) 07/04/2020   Palpitations    PAT (paroxysmal atrial tachycardia) 07/04/2020   Preventative health care 08/14/2015   PVC (premature ventricular contraction)    PVC's (premature ventricular contractions) 06/10/2019   Seasonal allergies 05/26/2014   Last Assessment & Plan:  Formatting of this note might be different from the  original. Pt taking OTC allergy med Formatting of this note might be different from the original. 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   amitriptyline (ELAVIL) 10 MG tablet Take 10 mg by mouth daily as needed (migraines/heart palpitations).   diclofenac Sodium (VOLTAREN) 1 % GEL Apply 1 application  topically daily as needed (knee pain (non-surgical knee)).   diphenhydrAMINE (BENADRYL) 25 mg capsule Take 1 capsule (25 mg total) by mouth at bedtime as needed for allergies.   famotidine-calcium carbonate-magnesium hydroxide (PEPCID COMPLETE) 10-800-165 MG chewable tablet Chew 1 tablet by mouth See admin instructions. Chew one tablet by mouth every morning, may also take one tablet in the afternoon as needed for acid reflux   meclizine (ANTIVERT) 25 MG tablet Take 1 tablet (25 mg total) by mouth 3 (three) times daily as needed for dizziness.   metoprolol succinate (TOPROL-XL) 100 MG 24 hr tablet Take 100 mg by mouth daily. Take with or immediately following a meal.   metoprolol succinate (TOPROL-XL) 50 MG 24 hr tablet Take 50 mg by mouth daily. Take with or immediately following a meal.   Polyvinyl Alcohol-Povidone (REFRESH OP) Place 1 drop into both eyes daily as needed (dry eye).   pregabalin (LYRICA) 100 MG capsule Take 100 mg by mouth 2 (two) times daily.     Allergies:   Amlodipine besylate, Ciprofloxacin, Diltiazem hcl er, Morphine, Morpholine salicylate, Prednisolone, Gabapentin, Losartan potassium, Ofloxacin, and Prednisone   Social History   Socioeconomic History   Marital status: Widowed    Spouse name: Not on file   Number of children: Not on file   Years of education: Not on file   Highest education level: Not on file  Occupational History   Not on file  Tobacco Use   Smoking status: Never   Smokeless tobacco: Never   Vaping Use   Vaping Use: Never used  Substance and Sexual Activity   Alcohol use: Never   Drug use: Never   Sexual activity: Not Currently  Other Topics Concern   Not on file  Social History Narrative   Not on file   Social Determinants of Health   Financial Resource Strain: Not on file  Food Insecurity: Not on file  Transportation Needs: Not on file  Physical Activity: Not on file  Stress: Not on file  Social Connections: Not on file     Family History: The patient's family history includes Cancer in her father; Diabetes in her maternal grandmother and sister; Heart attack in her sister; Hypertension in her mother; Leukemia in her brother; Lung cancer in her mother.  ROS:   Review of Systems  Constitution: Negative for decreased appetite, fever and weight gain.  HENT: Negative for congestion, ear discharge,  hoarse voice and sore throat.   Eyes: Negative for discharge, redness, vision loss in right eye and visual halos.  Cardiovascular: Negative for chest pain, dyspnea on exertion, leg swelling, orthopnea and palpitations.  Respiratory: Negative for cough, hemoptysis, shortness of breath and snoring.   Endocrine: Negative for heat intolerance and polyphagia.  Hematologic/Lymphatic: Negative for bleeding problem. Does not bruise/bleed easily.  Skin: Negative for flushing, nail changes, rash and suspicious lesions.  Musculoskeletal: Negative for arthritis, joint pain, muscle cramps, myalgias, neck pain and stiffness.  Gastrointestinal: Negative for abdominal pain, bowel incontinence, diarrhea and excessive appetite.  Genitourinary: Negative for decreased libido, genital sores and incomplete emptying.  Neurological: Negative for brief paralysis, focal weakness, headaches and loss of balance.  Psychiatric/Behavioral: Negative for altered mental status, depression and suicidal ideas.  Allergic/Immunologic: Negative for HIV exposure and persistent infections.    EKGs/Labs/Other  Studies Reviewed:    The following studies were reviewed today:   EKG:  The ekg ordered today demonstrates sinus rhythm, heart rate 74 bpm with rare PACs  Pharmacologic stress test Nuclear stress EF: 78%. There was no ST segment deviation noted during stress. The study is normal. This is a low risk study. The left ventricular ejection fraction is hyperdynamic (>65%).   Normal stress nuclear study with no ischemia or infarction.  Gated ejection fraction 78% with normal wall motion.     Echo IMPRESSIONS Feb 29, 2020  1. Left ventricular ejection fraction, by estimation, is 60 to 65%. The left ventricle has normal function. The left ventricle has no regional wall motion abnormalities. There is mild concentric left ventricular  hypertrophy. Left ventricular diastolic parameters are consistent with Grade I diastolic dysfunction (impaired relaxation).   2. Right ventricular systolic function is normal. The right ventricular size is normal. There is moderately elevated pulmonary artery systolic pressure. The estimated right ventricular systolic pressure is moderately elevated 54.0 mmHg.   3. The mitral valve is normal in structure. No evidence of mitral valve regurgitation. No evidence of mitral stenosis.   4. The aortic valve is tricuspid. Aortic valve regurgitation is not visualized. No aortic stenosis is present.   5. The inferior vena cava is normal in size with greater than 50% respiratory variability, suggesting right atrial pressure of 3 mmHg.    Zio monitor Mar 23, 2020 The patient wore the monitor for 12 days 21 hours starting February 14, 2020. Indication: Palpitations   The minimum heart rate was 55 bpm, maximum heart rate was 193 bpm, and average heart rate was 76 bpm. Predominant underlying rhythm was Sinus Rhythm.   45 Supraventricular Tachycardia runs occurred, the run with the fastest interval lasting 4 beats with a maximal rate of 193 bpm, the longest lasting 13.6 seconds with  an average rate of 122 bpm.   Premature atrial complexes were occasional (4.3%,61832). Premature Ventricular complexes were rare (less than 1%).   No ventricular tachycardia, no pauses, No AV block and no atrial fibrillation present. 7 patient triggered events noted all associated premature atrial complexes.  7 diary events for associated with premature ventricular complexes and 3 associated with premature atrial complexes.   Conclusion: This study is remarkable for the following:                             1.  45 episodes of paroxysmal supraventricular tachycardia which is likely atrial tachycardia with variable block.  2.  Symptomatic occasional premature atrial complexes.  Recent Labs: 04/11/2022: B Natriuretic Peptide 38.8 04/12/2022: ALT 13 04/13/2022: Hemoglobin 11.4; Platelets 378 02/09/2023: BUN 9; Creatinine, Ser 0.87; Magnesium 2.3; Potassium 5.0; Sodium 140  Recent Lipid Panel No results found for: "CHOL", "TRIG", "HDL", "CHOLHDL", "VLDL", "LDLCALC", "LDLDIRECT"  Physical Exam:    VS:  BP (!) 142/78   Pulse 68     Wt Readings from Last 3 Encounters:  04/11/22 181 lb (82.1 kg)  04/01/22 181 lb (82.1 kg)  03/25/22 181 lb (82.1 kg)     GEN: Well nourished, well developed in no acute distress HEENT: Normal NECK: No JVD; No carotid bruits LYMPHATICS: No lymphadenopathy CARDIAC: S1S2 noted,RRR, no murmurs, rubs, gallops RESPIRATORY:  Clear to auscultation without rales, wheezing or rhonchi  ABDOMEN: Soft, non-tender, non-distended, +bowel sounds, no guarding. EXTREMITIES: No edema, No cyanosis, no clubbing MUSCULOSKELETAL:  No deformity  SKIN: Warm and dry NEUROLOGIC:  Alert and oriented x 3, non-focal PSYCHIATRIC:  Normal affect, good insight  ASSESSMENT:    1. Medication management   2. Hypertension, unspecified type   3. PVC (premature ventricular contraction)   4. PAC (premature atrial contraction)    PLAN:     Metoprolol was  recently increased to 100 mg in the morning 50 mg at night.  I am not going to change anything for now making sure that we do not use hypotension.  Reviewed her EKG which showed evidence of PACs and some PVCs.  Same as her EKG which was done at her PCP office.  Blood work will be done today for BMP and mag.  Blood pressure is acceptable, continue with current antihypertensive regimen.   The patient is in agreement with the above plan. The patient left the office in stable condition.  The patient will follow up in 1 year or sooner if needed.   Medication Adjustments/Labs and Tests Ordered: Current medicines are reviewed at length with the patient today.  Concerns regarding medicines are outlined above.  Orders Placed This Encounter  Procedures   Basic Metabolic Panel (BMET)   Magnesium   EKG 12-Lead   No orders of the defined types were placed in this encounter.    Patient Instructions  Medication Instructions:  Your physician recommends that you continue on your current medications as directed. Please refer to the Current Medication list given to you today.  *If you need a refill on your cardiac medications before your next appointment, please call your pharmacy*   Lab Work: Your physician recommends that you have labs drawn today: BMET, Mag If you have labs (blood work) drawn today and your tests are completely normal, you will receive your results only by: MyChart Message (if you have MyChart) OR A paper copy in the mail If you have any lab test that is abnormal or we need to change your treatment, we will call you to review the results.   Testing/Procedures: None   Follow-Up: At [facility], you and your health needs are our priority.  As part of our continuing mission to provide you with exceptional heart care, we have created designated Provider Care Teams.  These Care Teams include your primary Cardiologist (physician) and Advanced Practice Providers (APPs  -  Physician Assistants and Nurse Practitioners) who all work together to provide you with the care you need, when you need it.  We recommend signing up for the patient portal called "MyChart".  Sign up information is provided on this After Visit Summary.  MyChart is used to connect with patients for Virtual Visits (Telemedicine).  Patients are able to view lab/test results, encounter notes, upcoming appointments, etc.  Non-urgent messages can be sent to your provider as well.   To learn more about what you can do with MyChart, go to [email or web address].    Your next appointment:   6 month(s)  Provider:   Thomasene Ripple, DO     Adopting a Healthy Lifestyle.  Know what a healthy weight is for you (roughly BMI <25) and aim to maintain this   Aim for 7+ servings of fruits and vegetables daily   65-80+ fluid ounces of water or unsweet tea for healthy kidneys   Limit to max 1 drink of alcohol per day; avoid smoking/tobacco   Limit animal fats in diet for cholesterol and heart health - choose grass fed whenever available   Avoid highly processed foods, and foods high in saturated/trans fats   Aim for low stress - take time to unwind and care for your mental health   Aim for 150 min of moderate intensity exercise weekly for heart health, and weights twice weekly for bone health   Aim for 7-9 hours of sleep daily   When it comes to diets, agreement about the perfect plan isnt easy to find, even among the experts. Experts at the [facility] [employer] [facility] developed an idea known as the Healthy Eating Plate. Just imagine a plate divided into logical, healthy portions.   The emphasis is on diet quality:   Load up on vegetables and fruits - one-half of your plate: Aim for color and variety, and remember that potatoes dont count.   Go for whole grains - one-quarter of your plate: Whole wheat, barley, wheat berries, quinoa, oats, brown rice, and foods made with them. If you want  pasta, go with whole wheat pasta.   Protein power - one-quarter of your plate: Fish, chicken, beans, and nuts are all healthy, versatile protein sources. Limit red meat.   The diet, however, does go beyond the plate, offering a few other suggestions.   Use healthy plant oils, such as olive, canola, soy, corn, sunflower and peanut. Check the labels, and avoid partially hydrogenated oil, which have unhealthy trans fats.   If youre thirsty, drink water. Coffee and tea are good in moderation, but skip sugary drinks and limit milk and dairy products to one or two daily servings.   The type of carbohydrate in the diet is more important than the amount. Some sources of carbohydrates, such as vegetables, fruits, whole grains, and beans-are healthier than others.   Finally, stay active  Signed, Thomasene Ripple, DO  02/10/2023 8:12 AM    [facility]

## 2023-02-10 LAB — BASIC METABOLIC PANEL
BUN/Creatinine Ratio: 10 — ABNORMAL LOW (ref 12–28)
BUN: 9 mg/dL (ref 8–27)
CO2: 25 mmol/L (ref 20–29)
Calcium: 10.1 mg/dL (ref 8.7–10.3)
Chloride: 102 mmol/L (ref 96–106)
Creatinine, Ser: 0.87 mg/dL (ref 0.57–1.00)
Glucose: 117 mg/dL — ABNORMAL HIGH (ref 70–99)
Potassium: 5 mmol/L (ref 3.5–5.2)
Sodium: 140 mmol/L (ref 134–144)
eGFR: 67 mL/min/{1.73_m2} (ref >59)

## 2023-02-10 LAB — MAGNESIUM: Magnesium: 2.3 mg/dL (ref 1.6–2.3)

## 2024-03-23 ENCOUNTER — Encounter: Payer: Self-pay | Admitting: Cardiology

## 2024-03-23 ENCOUNTER — Ambulatory Visit

## 2024-03-23 ENCOUNTER — Ambulatory Visit: Payer: Medicare Other | Attending: Cardiology | Admitting: Cardiology

## 2024-03-23 VITALS — BP 174/64 | HR 59 | Ht 67.5 in | Wt 193.2 lb

## 2024-03-23 DIAGNOSIS — I491 Atrial premature depolarization: Secondary | ICD-10-CM | POA: Diagnosis not present

## 2024-03-23 DIAGNOSIS — I493 Ventricular premature depolarization: Secondary | ICD-10-CM

## 2024-03-23 DIAGNOSIS — H8190 Unspecified disorder of vestibular function, unspecified ear: Secondary | ICD-10-CM | POA: Diagnosis not present

## 2024-03-23 DIAGNOSIS — I10 Essential (primary) hypertension: Secondary | ICD-10-CM | POA: Diagnosis not present

## 2024-03-23 NOTE — Progress Notes (Unsigned)
 Enrolled for [employer] to mail a ZIO XT long term holter monitor to the patients address on file.

## 2024-03-23 NOTE — Progress Notes (Signed)
 Cardiology Office Note:    Date:  04/01/2024   ID:  EMILE RINGGENBERG, DOB 1941/04/20, MRN [id number]  PCP:  Amadeo June, MD  Cardiologist:  Jerryl Morin, DO  Electrophysiologist:  None   Referring MD: Amadeo June, MD   " I am ok -my primary increase my metoprolol  recently.   History of Present Illness:    KEANU FRICKEY is a 83 y.o. female with a hx of hypertension, hyperlipidemia, PVCs, PACs and paroxysmal atrial tachycardia is here today for follow-up visit.  She takes metoprolol , 100 mg in the morning and 50 mg at night, for hypertension. Her blood pressure typically measures around 150/70 mmHg, but she finds it intolerable when it reaches 170 mmHg. Her heart rate has decreased to 59 bpm. She has previously tried acrobenolol and Humira but experienced severe side effects, including an inability to lift her arm.  She experiences palpitations, described as 'flip flopping' of the heart, usually occurring in the morning and often when startled awake. She feels consistently fatigued, which she attributes to the high dose of metoprolol . A left bundle branch block was identified in April 2024.  No complaints at this time.  Past Medical History:  Diagnosis Date   Acute sphenoidal sinusitis 06/28/2014   Last Assessment & Plan:  Formatting of this note might be different from the original. Pt will continue nasal steroid. Z pack sent to pharmacy.   Anemia    Axillary fullness 04/06/2017   Barrett's esophagus with esophagitis 07/04/2015   Cancer (HCC)    1979 cervical   Chronic fatigue 06/28/2014   Last Assessment & Plan:  Formatting of this note might be different from the original. Psychological condition is worsening. May need to do sleep study to r/o OSA check labs today Psychological condition  will be reassessed in 3 months.   Constipation    Fibromyalgia    Gastritis 10/07/2016   Headache    Hx of bilateral breast implants 04/06/2017   Hyperlipemia 05/26/2014   Last  Assessment & Plan:  Formatting of this note might be different from the original. Lipid abnormalities are unchanged. Pt off cholesterol medicine Nutritional counseling was provided. and Pharmacotherapy as ordered. Lipids will be reassessed in 6 months. Formatting of this note might be different from the original. Last Assessment & Plan:  Lipid abnormalities are unchanged. Pt off cholesterol m   Hyperlipidemia    Hypertension 05/26/2014   Last Assessment & Plan:  Formatting of this note might be different from the original. Hypertension is improving with treatment. Continue current treatment regimen. Dietary sodium restriction. Regular aerobic exercise. Blood pressure will be reassessed in 4 months. Formatting of this note might be different from the original. Last Assessment & Plan:  Hypertension is improving with treatment. Conti   [facility]) 05/26/2014   Last Assessment & Plan:  Formatting of this note might be different from the original. Pt needs f/u brain MRI   Otalgia of left ear 04/16/2016   Other chest pain 06/10/2019   PAC (premature atrial contraction) 07/04/2020   Palpitations    PAT (paroxysmal atrial tachycardia) (HCC) 07/04/2020   Preventative health care 08/14/2015   PVC (premature ventricular contraction)    PVC's (premature ventricular contractions) 06/10/2019   Seasonal allergies 05/26/2014   Last Assessment & Plan:  Formatting of this note might be different from the original. Pt taking OTC allergy med Formatting of this note might be different from the original. 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   amitriptyline  (ELAVIL ) 10 MG tablet Take 10 mg by mouth daily as needed (migraines/heart palpitations).   anastrozole (ARIMIDEX) 1 MG tablet Take 1 mg by mouth daily.   diclofenac Sodium (VOLTAREN) 1 % GEL Apply 1 application  topically daily as needed (knee pain (non-surgical knee)).   diphenhydrAMINE  (BENADRYL ) 25 mg capsule Take 1 capsule (25 mg total) by mouth at bedtime as needed for allergies.   famotidine -calcium carbonate-magnesium hydroxide (PEPCID  COMPLETE) [phone] MG chewable tablet Chew 1 tablet by mouth See admin instructions. Chew one tablet by mouth every morning, may also take one tablet in the afternoon as needed for acid reflux   meclizine  (ANTIVERT ) 25 MG tablet Take 1 tablet (25 mg total) by mouth 3 (three) times daily as needed for dizziness.   metoprolol  succinate (TOPROL -XL) 100 MG 24 hr tablet Take 100 mg by mouth daily. Take with or immediately following a meal.   metoprolol  succinate (TOPROL -XL) 50 MG 24 hr tablet Take 50 mg by mouth daily. Take with or immediately following a meal.   Polyvinyl Alcohol-Povidone (REFRESH OP) Place 1 drop into both eyes daily as needed (dry eye).   pregabalin  (LYRICA ) 100 MG capsule Take 100 mg by mouth 2 (two) times daily.     Allergies:   Covid-19 mrna vacc (moderna), Amlodipine  besylate, Ciprofloxacin, Diltiazem  hcl er, Morphine, Morpholine salicylate, Prednisolone, Gabapentin, Losartan potassium, Ofloxacin, and Prednisone   Social History   Socioeconomic History   Marital status: Widowed    Spouse name: Not on file   Number of children: Not on file   Years of education: Not on file   Highest education level: Not on file  Occupational History   Not on file   Tobacco Use   Smoking status: Never   Smokeless tobacco: Never  Vaping Use   Vaping status: Never Used  Substance and Sexual Activity   Alcohol use: Never   Drug use: Never   Sexual activity: Not Currently  Other Topics Concern   Not on file  Social History Narrative   Not on file   Social Drivers of Health   Financial Resource Strain: Low Risk  (02/23/2024)   Received from [facility]   Overall Financial Resource Strain (CARDIA)    Difficulty of Paying Living Expenses: Not hard at all  Food Insecurity: No Food Insecurity (02/23/2024)   Received from [facility]   Hunger Vital Sign    Worried About Running Out of Food in the Last Year: Never true    Ran Out of Food in the Last Year: Never true  Transportation Needs: No Transportation Needs (02/23/2024)   Received from [facility] - Transportation    Lack of Transportation (Medical): No    Lack of Transportation (  Non-Medical): No  Physical Activity: Not on file  Stress: Not on file  Social Connections: Not on file     Family History: The patient's family history includes Cancer in her father; Diabetes in her maternal grandmother and sister; Heart attack in her sister; Hypertension in her mother; Leukemia in her brother; Lung cancer in her mother.  ROS:   Review of Systems  Constitution: Negative for decreased appetite, fever and weight gain.  HENT: Negative for congestion, ear discharge, hoarse voice and sore throat.   Eyes: Negative for discharge, redness, vision loss in right eye and visual halos.  Cardiovascular: Negative for chest pain, dyspnea on exertion, leg swelling, orthopnea and palpitations.  Respiratory: Negative for cough, hemoptysis, shortness of breath and snoring.   Endocrine: Negative for heat intolerance and polyphagia.  Hematologic/Lymphatic: Negative for bleeding problem. Does not bruise/bleed easily.  Skin: Negative for flushing, nail changes, rash and suspicious lesions.   Musculoskeletal: Negative for arthritis, joint pain, muscle cramps, myalgias, neck pain and stiffness.  Gastrointestinal: Negative for abdominal pain, bowel incontinence, diarrhea and excessive appetite.  Genitourinary: Negative for decreased libido, genital sores and incomplete emptying.  Neurological: Negative for brief paralysis, focal weakness, headaches and loss of balance.  Psychiatric/Behavioral: Negative for altered mental status, depression and suicidal ideas.  Allergic/Immunologic: Negative for HIV exposure and persistent infections.    EKGs/Labs/Other Studies Reviewed:    The following studies were reviewed today:   EKG:  The ekg ordered today demonstrates   Pharmacologic stress test Nuclear stress EF: 78%. There was no ST segment deviation noted during stress. The study is normal. This is a low risk study. The left ventricular ejection fraction is hyperdynamic (>65%).   Normal stress nuclear study with no ischemia or infarction.  Gated ejection fraction 78% with normal wall motion.     Echo IMPRESSIONS Feb 29, 2020  1. Left ventricular ejection fraction, by estimation, is 60 to 65%. The left ventricle has normal function. The left ventricle has no regional wall motion abnormalities. There is mild concentric left ventricular  hypertrophy. Left ventricular diastolic parameters are consistent with Grade I diastolic dysfunction (impaired relaxation).   2. Right ventricular systolic function is normal. The right ventricular size is normal. There is moderately elevated pulmonary artery systolic pressure. The estimated right ventricular systolic pressure is moderately elevated 54.0 mmHg.   3. The mitral valve is normal in structure. No evidence of mitral valve regurgitation. No evidence of mitral stenosis.   4. The aortic valve is tricuspid. Aortic valve regurgitation is not visualized. No aortic stenosis is present.   5. The inferior vena cava is normal in size with greater than  50% respiratory variability, suggesting right atrial pressure of 3 mmHg.    Zio monitor Mar 23, 2020 The patient wore the monitor for 12 days 21 hours starting February 14, 2020. Indication: Palpitations   The minimum heart rate was 55 bpm, maximum heart rate was 193 bpm, and average heart rate was 76 bpm. Predominant underlying rhythm was Sinus Rhythm.   45 Supraventricular Tachycardia runs occurred, the run with the fastest interval lasting 4 beats with a maximal rate of 193 bpm, the longest lasting 13.6 seconds with an average rate of 122 bpm.   Premature atrial complexes were occasional (4.3%,61832). Premature Ventricular complexes were rare (less than 1%).   No ventricular tachycardia, no pauses, No AV block and no atrial fibrillation present. 7 patient triggered events noted all associated premature atrial complexes.  7 diary events for associated with  premature ventricular complexes and 3 associated with premature atrial complexes.   Conclusion: This study is remarkable for the following:                             1.  45 episodes of paroxysmal supraventricular tachycardia which is likely atrial tachycardia with variable block.                             2.  Symptomatic occasional premature atrial complexes.  Recent Labs: No results found for requested labs within last 365 days.  Recent Lipid Panel No results found for: "CHOL", "TRIG", "HDL", "CHOLHDL", "VLDL", "LDLCALC", "LDLDIRECT"  Physical Exam:    VS:  BP (!) 174/64 (BP Location: Right Arm, Patient Position: Sitting, Cuff Size: Normal)   Pulse (!) 59   Ht 5' 7.5" (1.715 m)   Wt 193 lb 3.2 oz (87.6 kg)   SpO2 93%   BMI 29.81 kg/m     Wt Readings from Last 3 Encounters:  03/23/24 193 lb 3.2 oz (87.6 kg)  04/11/22 181 lb (82.1 kg)  04/01/22 181 lb (82.1 kg)     GEN: Well nourished, well developed in no acute distress HEENT: Normal NECK: No JVD; No carotid bruits LYMPHATICS: No lymphadenopathy CARDIAC: S1S2  noted,RRR, no murmurs, rubs, gallops RESPIRATORY:  Clear to auscultation without rales, wheezing or rhonchi  ABDOMEN: Soft, non-tender, non-distended, +bowel sounds, no guarding. EXTREMITIES: No edema, No cyanosis, no clubbing MUSCULOSKELETAL:  No deformity  SKIN: Warm and dry NEUROLOGIC:  Alert and oriented x 3, non-focal PSYCHIATRIC:  Normal affect, good insight  ASSESSMENT:    1. Hypertension, unspecified type   2. 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 PVC (premature ventricular contraction)   4. PAC (premature atrial contraction)    PLAN:    Hypertension Hypertension with blood pressure around 150/70 mmHg, managed with metoprolol . - Continue metoprolol  100 mg in the morning and 50 mg at night. - Monitor blood pressure regularly.  Sinus bradycardia Sinus bradycardia with heart rate dropping to 59 bpm, likely due to high-dose metoprolol . Fatigue possibly related to bradycardia and medication. Discussed heart monitor to assess pauses and metoprolol 's impact. - she is agreeable to we - Discuss potential adjustment of metoprolol  dosage if significant pauses are detected.  Left bundle branch block - Chronic left bundle branch block noted on EKG  Tinnitus Chronic tinnitus with recent worsening. Suggested ENT referral for further evaluation. - Refer to ENT specialist for evaluation and management of tinnitus.   The patient is in agreement with the above plan. The patient left the office in stable condition.  The patient will follow up in 1 year or sooner if needed.   Medication Adjustments/Labs and Tests Ordered: Current medicines are reviewed at length with the patient today.  Concerns regarding medicines are outlined above.  Orders Placed This Encounter  Procedures   Ambulatory referral to ENT   LONG TERM MONITOR (3-14 DAYS)   EKG 12-Lead   No orders of the defined types were placed in this encounter.    Patient Instructions     Testing/Procedures:  ZIO AT Long term monitor-Live Telemetry  Your physician has requested you wear a ZIO patch monitor for 14 days.  This is a single patch monitor. [employer] supplies one patch monitor per enrollment. Additional  stickers are not available.  Please do not apply patch if you  will be having a Nuclear Stress Test, Echocardiogram, Cardiac CT, MRI,  or Chest Xray during the period you would be wearing the monitor. The patch cannot be worn during  these tests. You cannot remove and re-apply the ZIO AT patch monitor.  Your ZIO patch monitor will be mailed 3 day USPS to your address on file. It may take 3-5 days to  receive your monitor after you have been enrolled.  Once you have received your monitor, please review the enclosed instructions. Your monitor has  already been registered assigning a specific monitor serial # to you.   Billing and Patient Assistance Program information  Sanna Crystal has been supplied with any insurance information on record for billing. [employer] offers a sliding scale Patient Assistance Program for patients without insurance, or whose  insurance does not completely cover the cost of the ZIO patch monitor. You must apply for the  Patient Assistance Program to qualify for the discounted rate. To apply, call [employer] at [phone],  select option 4, select option 2 , ask to apply for the Patient Assistance Program, (you can request an  interpreter if needed). [employer] will ask your household income and how many people are in your  household. [employer] will quote your out-of-pocket cost based on this information. They will also be able  to set up a 12 month interest free payment plan if needed.  Applying the monitor   Shave hair from upper left chest.  Hold the abrader disc by orange tab. Rub the abrader in 40 strokes over left upper chest as indicated in  your monitor instructions.  Clean area with 4 enclosed alcohol pads. Use all pads to ensure the  area is cleaned thoroughly. Let  dry.  Apply patch as indicated in monitor instructions. Patch will be placed under collarbone on left side of  chest with arrow pointing upward.  Rub patch adhesive wings for 2 minutes. Remove the white label marked "1". Remove the white label  marked "2". Rub patch adhesive wings for 2 additional minutes.  While looking in a mirror, press and release button in center of patch. A small green light will flash 3-4  times. This will be your only indicator that the monitor has been turned on.  Do not shower for the first 24 hours. You may shower after the first 24 hours.  Press the button if you feel a symptom. You will hear a small click. Record Date, Time and Symptom in  the Patient Log.   Starting the Gateway  In your kit there is a Audiological scientist box the size of a cellphone. This is Buyer, retail. It transmits all your  recorded data to [facility]. This box must always stay within 10 feet of you. Open the box and push the *  button. There will be a light that blinks orange and then green a few times. When the light stops  blinking, the Gateway is connected to the ZIO patch. Call [employer] at [phone] to confirm your monitor is transmitting.  Returning your monitor  Remove your patch and place it inside the Gateway. In the lower half of the Gateway there is a white  bag with prepaid postage on it. Place Gateway in bag and seal. Mail package back to [place] as soon as  possible. Your physician should have your final report approximately 7 days after you have mailed back  your monitor. Call [facility] Customer Care at [phone] if you have questions regarding your ZIO AT  patch  monitor. Call them immediately if you see an orange light blinking on your monitor.  If your monitor falls off in less than 4 days, contact our Monitor department at [phone]. If your  monitor becomes loose or falls off after 4 days call [employer] at [phone] for  suggestions on  securing your monitor   Follow-Up: At [facility], you and your health needs are our priority.  As part of our continuing mission to provide you with exceptional heart care, our providers are all part of one team.  This team includes your primary Cardiologist (physician) and Advanced Practice Providers or APPs (Physician Assistants and Nurse Practitioners) who all work together to provide you with the care you need, when you need it.  Your next appointment:   6 month(s)  Provider:   Summit Borchardt, DO          Adopting a Healthy Lifestyle.  Know what a healthy weight is for you (roughly BMI <25) and aim to maintain this   Aim for 7+ servings of fruits and vegetables daily   65-80+ fluid ounces of water  or unsweet tea for healthy kidneys   Limit to max 1 drink of alcohol per day; avoid smoking/tobacco   Limit animal fats in diet for cholesterol and heart health - choose grass fed whenever available   Avoid highly processed foods, and foods high in saturated/trans fats   Aim for low stress - take time to unwind and care for your mental health   Aim for 150 min of moderate intensity exercise weekly for heart health, and weights twice weekly for bone health   Aim for 7-9 hours of sleep daily   When it comes to diets, agreement about the perfect plan isnt easy to find, even among the experts. Experts at the [employer] developed an idea known as the Healthy Eating Plate. Just imagine a plate divided into logical, healthy portions.   The emphasis is on diet quality:   Load up on vegetables and fruits - one-half of your plate: Aim for color and variety, and remember that potatoes dont count.   Go for whole grains - one-quarter of your plate: Whole wheat, barley, wheat berries, quinoa, oats, brown rice, and foods made with them. If you want pasta, go with whole wheat pasta.   Protein power - one-quarter of your plate: Fish, chicken,  beans, and nuts are all healthy, versatile protein sources. Limit red meat.   The diet, however, does go beyond the plate, offering a few other suggestions.   Use healthy plant oils, such as olive, canola, soy, corn, sunflower and peanut. Check the labels, and avoid partially hydrogenated oil, which have unhealthy trans fats.   If youre thirsty, drink water . Coffee and tea are good in moderation, but skip sugary drinks and limit milk and dairy products to one or two daily servings.   The type of carbohydrate in the diet is more important than the amount. Some sources of carbohydrates, such as vegetables, fruits, whole grains, and beans-are healthier than others.   Finally, stay active  Signed, Gali Spinney, DO  04/01/2024 9:37 PM    [facility]

## 2024-03-23 NOTE — Patient Instructions (Signed)
 Testing/Procedures:  ZIO AT Long term monitor-Live Telemetry  Your physician has requested you wear a ZIO patch monitor for 14 days.  This is a single patch monitor. [employer] supplies one patch monitor per enrollment. Additional  stickers are not available.  Please do not apply patch if you will be having a Nuclear Stress Test, Echocardiogram, Cardiac CT, MRI,  or Chest Xray during the period you would be wearing the monitor. The patch cannot be worn during  these tests. You cannot remove and re-apply the ZIO AT patch monitor.  Your ZIO patch monitor will be mailed 3 day USPS to your address on file. It may take 3-5 days to  receive your monitor after you have been enrolled.  Once you have received your monitor, please review the enclosed instructions. Your monitor has  already been registered assigning a specific monitor serial # to you.   Billing and Patient Assistance Program information  Sanna Crystal has been supplied with any insurance information on record for billing. [employer] offers a sliding scale Patient Assistance Program for patients without insurance, or whose  insurance does not completely cover the cost of the ZIO patch monitor. You must apply for the  Patient Assistance Program to qualify for the discounted rate. To apply, call [employer] at [phone],  select option 4, select option 2 , ask to apply for the Patient Assistance Program, (you can request an  interpreter if needed). [employer] will ask your household income and how many people are in your  household. [employer] will quote your out-of-pocket cost based on this information. They will also be able  to set up a 12 month interest free payment plan if needed.  Applying the monitor   Shave hair from upper left chest.  Hold the abrader disc by orange tab. Rub the abrader in 40 strokes over left upper chest as indicated in  your monitor instructions.  Clean area with 4 enclosed alcohol pads. Use all pads to ensure the area  is cleaned thoroughly. Let  dry.  Apply patch as indicated in monitor instructions. Patch will be placed under collarbone on left side of  chest with arrow pointing upward.  Rub patch adhesive wings for 2 minutes. Remove the white label marked "1". Remove the white label  marked "2". Rub patch adhesive wings for 2 additional minutes.  While looking in a mirror, press and release button in center of patch. A small green light will flash 3-4  times. This will be your only indicator that the monitor has been turned on.  Do not shower for the first 24 hours. You may shower after the first 24 hours.  Press the button if you feel a symptom. You will hear a small click. Record Date, Time and Symptom in  the Patient Log.   Starting the Gateway  In your kit there is a Audiological scientist box the size of a cellphone. This is Buyer, retail. It transmits all your  recorded data to [facility]. This box must always stay within 10 feet of you. Open the box and push the *  button. There will be a light that blinks orange and then green a few times. When the light stops  blinking, the Gateway is connected to the ZIO patch. Call [employer] at [phone] to confirm your monitor is transmitting.  Returning your monitor  Remove your patch and place it inside the Gateway. In the lower half of the Gateway there is a white  bag with prepaid postage on it.  Place Gateway in bag and seal. Mail package back to [place] as soon as  possible. Your physician should have your final report approximately 7 days after you have mailed back  your monitor. Call [facility] Customer Care at [phone] if you have questions regarding your ZIO AT  patch monitor. Call them immediately if you see an orange light blinking on your monitor.  If your monitor falls off in less than 4 days, contact our Monitor department at [phone]. If your  monitor becomes loose or falls off after 4 days call [employer] at [phone] for  suggestions on  securing your monitor   Follow-Up: At [facility], you and your health needs are our priority.  As part of our continuing mission to provide you with exceptional heart care, our providers are all part of one team.  This team includes your primary Cardiologist (physician) and Advanced Practice Providers or APPs (Physician Assistants and Nurse Practitioners) who all work together to provide you with the care you need, when you need it.  Your next appointment:   6 month(s)  Provider:   Kardie Tobb, DO

## 2024-06-21 ENCOUNTER — Institutional Professional Consult (permissible substitution) (INDEPENDENT_AMBULATORY_CARE_PROVIDER_SITE_OTHER): Admitting: Otolaryngology

## 2024-08-09 ENCOUNTER — Ambulatory Visit (INDEPENDENT_AMBULATORY_CARE_PROVIDER_SITE_OTHER): Admitting: Audiology

## 2024-08-09 ENCOUNTER — Institutional Professional Consult (permissible substitution) (INDEPENDENT_AMBULATORY_CARE_PROVIDER_SITE_OTHER)

## 2024-08-26 ENCOUNTER — Other Ambulatory Visit (INDEPENDENT_AMBULATORY_CARE_PROVIDER_SITE_OTHER): Payer: Self-pay

## 2024-08-26 DIAGNOSIS — H905 Unspecified sensorineural hearing loss: Secondary | ICD-10-CM

## 2024-08-31 ENCOUNTER — Ambulatory Visit (INDEPENDENT_AMBULATORY_CARE_PROVIDER_SITE_OTHER): Admitting: Audiology

## 2024-08-31 ENCOUNTER — Ambulatory Visit (INDEPENDENT_AMBULATORY_CARE_PROVIDER_SITE_OTHER)

## 2024-08-31 VITALS — BP 154/79 | Temp 97.5°F | Ht 67.0 in | Wt 190.0 lb

## 2024-08-31 DIAGNOSIS — K219 Gastro-esophageal reflux disease without esophagitis: Secondary | ICD-10-CM

## 2024-08-31 DIAGNOSIS — H90A21 Sensorineural hearing loss, unilateral, right ear, with restricted hearing on the contralateral side: Secondary | ICD-10-CM

## 2024-08-31 DIAGNOSIS — J343 Hypertrophy of nasal turbinates: Secondary | ICD-10-CM | POA: Diagnosis not present

## 2024-08-31 DIAGNOSIS — J329 Chronic sinusitis, unspecified: Secondary | ICD-10-CM

## 2024-08-31 DIAGNOSIS — J342 Deviated nasal septum: Secondary | ICD-10-CM

## 2024-08-31 DIAGNOSIS — H903 Sensorineural hearing loss, bilateral: Secondary | ICD-10-CM

## 2024-08-31 DIAGNOSIS — R0989 Other specified symptoms and signs involving the circulatory and respiratory systems: Secondary | ICD-10-CM | POA: Diagnosis not present

## 2024-08-31 DIAGNOSIS — H90A22 Sensorineural hearing loss, unilateral, left ear, with restricted hearing on the contralateral side: Secondary | ICD-10-CM

## 2024-08-31 MED ORDER — OMEPRAZOLE 20 MG PO CPDR: 20.0000 mg | DELAYED_RELEASE_CAPSULE | Freq: Two times a day (BID) | ORAL | 1 refills | Status: AC

## 2024-08-31 MED ORDER — FLUTICASONE PROPIONATE 50 MCG/ACT NA SUSP: 2.0000 | Freq: Every day | NASAL | 6 refills | Status: AC

## 2024-08-31 NOTE — Progress Notes (Signed)
 Dear Dr. Sheena, Here is my assessment for our mutual patient, Jennifer Melendez. Thank you for allowing me the opportunity to care for your patient. Please do not hesitate to contact me should you have any other questions. Sincerely, Dr. Penne Croak  Otolaryngology Clinic Note Referring provider: Dr. Sheena HPI:  Discussed the use of AI scribe software for clinical note transcription with the patient, who gave verbal consent to proceed.  History of Present Illness Jennifer Melendez is an 83 year old female who presents with sinus issues and ear ringing. She is accompanied by her daughter, Mercy.  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 - Previously received allergy shots for 2-3 years - Past use of Flonase  Tinnitus - Bilateral ear ringing, longstanding and present prior to brain tumor surgery - Recent worsening of tinnitus, described as feeling like a 'diesel truck' in her head  Gastroesophageal reflux symptoms - Occasional use of Pepcid  for reflux symptoms - Thick mucus in throat, particularly at night and in the morning  Oncologic history - History of breast cancer - Currently taking anastrozole daily for hormone blockade - Declined chemotherapy, managing condition with hormone blockers only  Relevant surgical history and imaging - History of brain tumor surgery approximately 25 years ago, with removal of part of skull and sinuses - History of knee surgery - Multiple MRIs performed, including one in 2023 - Extensive testing performed at [facility]   Independent  Review of Additional Tests or Records:  Reviewed external note from referring PCP, Tobb,describing relevant history incorporated into today's evaluation. I personally reviewed and interpreted CT head 2023 - left fronal sinus with post surgical changes. Mild left ethmoid sinusisits. Sinuses otherwise clear. Deviated nasal septum present.   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 Right word discriminaton 92%, left word rec 88%.     PMH/Meds/All/SocHx/FamHx/ROS:   Past Medical History:  Diagnosis Date   Acute sphenoidal sinusitis 06/28/2014   Last Assessment & Plan:  Formatting of this note might be different from the original. Pt will continue nasal steroid. Z pack sent to pharmacy.   Anemia    Axillary fullness 04/06/2017   Barrett's esophagus with esophagitis 07/04/2015   Cancer (HCC)    1979 cervical   Chronic fatigue 06/28/2014   Last Assessment & Plan:  Formatting of this note might be different from the original. Psychological condition is worsening. May need to do sleep study to r/o OSA check labs today Psychological condition  will be reassessed in 3 months.   Constipation    Fibromyalgia    Gastritis 10/07/2016   Headache    Hx of bilateral breast implants 04/06/2017   Hyperlipemia 05/26/2014   Last Assessment & Plan:  Formatting of this note might be different from the original. Lipid abnormalities are unchanged. Pt off cholesterol medicine Nutritional counseling was provided. and Pharmacotherapy as ordered. Lipids will be reassessed in 6 months. Formatting of this note might be different from the original. Last Assessment & Plan:  Lipid abnormalities are unchanged. Pt off cholesterol m   Hyperlipidemia    Hypertension 05/26/2014   Last Assessment & Plan:  Formatting of this note might be different from the original. Hypertension is improving with treatment. Continue current treatment regimen. Dietary sodium restriction. Regular aerobic exercise. Blood pressure will be  reassessed in 4 months. Formatting of this note might be different from the original. Last Assessment & Plan:  Hypertension is improving with treatment. Conti   Meningioma [facility]) 05/26/2014   Last Assessment & Plan:  Formatting of this note might be different from the original. Pt needs f/u brain MRI   Otalgia of left ear 04/16/2016   Other chest pain 06/10/2019   PAC (premature atrial contraction) 07/04/2020   Palpitations    PAT (paroxysmal atrial tachycardia) 07/04/2020   Preventative health care 08/14/2015   PVC (premature ventricular contraction)    PVC's (premature ventricular contractions) 06/10/2019   Seasonal allergies 05/26/2014   Last Assessment & Plan:  Formatting of this note might be different from the original. Pt taking OTC allergy med Formatting of this note might be different from the original. 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     Past Surgical History:  Procedure Laterality Date   BACK SURGERY     herniated disc L 5   BRAIN SURGERY  2000   menigioma   BREAST SURGERY     1977 implants Left lump removed benign   CHOLECYSTECTOMY  1994   TOTAL KNEE ARTHROPLASTY Right 04/01/2022   Procedure: RIGHT TOTAL KNEE ARTHROPLASTY;  Surgeon: Sheril Coy, MD;  Location: [facility];  Service: Orthopedics;  Laterality: Right;   TUBAL LIGATION     varicose vein surgery      Family History  Problem Relation Age of Onset   Hypertension Mother    Lung cancer Mother    Cancer Father    Diabetes Sister    Heart attack Sister    Leukemia Brother    Diabetes Maternal Grandmother      Social Connections: Not on file      Current Outpatient Medications:    anastrozole (ARIMIDEX) 1 MG tablet, Take 1 mg by mouth daily., Disp: , Rfl:    fluticasone (FLONASE) 50 MCG/ACT nasal spray, Place 2 sprays into both nostrils daily., Disp: 16 g, Rfl: 6   meclizine  (ANTIVERT ) 25 MG tablet, Take 1 tablet (25 mg total) by mouth 3 (three) times  daily as needed for dizziness., Disp: 30 tablet, Rfl: 0   omeprazole (PRILOSEC) 20 MG capsule, Take 1 capsule (20 mg total) by mouth 2 (two) times daily before a meal., Disp: 180 capsule, Rfl: 1   acetaminophen  (TYLENOL ) 500 MG tablet, Take 500 mg by mouth 2 (two) times daily as needed (knee pain)., Disp: , Rfl:    amitriptyline  (ELAVIL ) 10 MG tablet, Take 10 mg by mouth daily as needed (migraines/heart palpitations)., Disp: , Rfl:    anastrozole (ARIMIDEX) 1 MG tablet, Take 1 mg by mouth daily., Disp: , Rfl:    diclofenac Sodium (VOLTAREN) 1 % GEL, Apply 1 application  topically daily as needed (knee pain (non-surgical knee))., Disp: , Rfl:    diphenhydrAMINE  (BENADRYL ) 25 mg capsule, Take 1 capsule (25 mg total) by mouth at bedtime as needed for allergies., Disp: 30 capsule, Rfl: 0   famotidine -calcium carbonate-magnesium hydroxide (PEPCID  COMPLETE) [phone] MG chewable tablet, Chew 1 tablet by mouth See admin instructions. Chew one tablet by mouth every morning, may also take one tablet in the afternoon as needed for acid reflux, Disp: , Rfl:    metoprolol  succinate (TOPROL -XL)  100 MG 24 hr tablet, Take 100 mg by mouth daily. Take with or immediately following a meal., Disp: , Rfl:    metoprolol  succinate (TOPROL -XL) 50 MG 24 hr tablet, Take 50 mg by mouth daily. Take with or immediately following a meal., Disp: , Rfl:    Polyvinyl Alcohol-Povidone (REFRESH OP), Place 1 drop into both eyes daily as needed (dry eye)., Disp: , Rfl:    pregabalin  (LYRICA ) 100 MG capsule, Take 100 mg by mouth 2 (two) times daily., Disp: , Rfl:    Physical Exam:   BP (!) 154/79 (BP Location: Left Arm, Patient Position: Sitting, Cuff Size: Normal)   Temp (!) 97.5 F (36.4 C) (Oral)   Ht 5' 7 (1.702 m)   Wt 190 lb (86.2 kg)   SpO2 95%   BMI 29.76 kg/m   The patient was awake, alert, and appropriate. The external ears were inspected, and otoscopy was performed to evaluate the external auditory canals and  tympanic membranes. The nasal cavity and septum were examined for mucosal changes, obstruction, or discharge. The oral cavity and oropharynx were inspected for mucosal lesions, infection, or tonsillar hypertrophy. The neck was palpated for lymphadenopathy, thyroid abnormalities, or other masses. Cranial nerve function was grossly intact.  Pertinent Findings: Physical Exam HEENT: Nasal septum deviated with significant congestion, particularly in the right nasal passage. Throat exhibits subtle vocal cord atrophy and erythema, suggestive of possible reflux.  Seprately Identifiable Procedures:  I personally ordered, reviewed and interpreted the following with the patient today  Given the patient's symptoms and incomplete visualization of critical sinonasal areas with anterior rhinoscopy, a separately performed diagnostic nasal endoscopy procedure is indicated for a complete rhinologic evaluation per American Rhinologic Society recommendations (https://[email or web address]-rhinologic.org/position-statements)  I personally ordered, reviewed and interpreted the following with the patient today  Procedure Note Diagnostic Nasal Endoscopy CPT CODE -- 68768 - Mod 25  Prior to initiating any procedures, risks/benefits/alternatives were explained to the patient and verbal consent obtained.  Pre-procedure diagnosis: Concern for sinusitis Post-procedure diagnosis: same Indication: See pre-procedure diagnosis and physical exam above Complications: None apparent EBL: 0 mL Anesthesia: Lidocaine 4% and topical decongestant was topically sprayed in each nasal cavity  Description of Procedure:  Patient was identified. A flexible fiberoptic endoscope was utilized to evaluate the sinonasal cavities, mucosa, sinus ostia and turbinates and septum.  Overall, signs of mild mucosal inflammation are noted.  Also noted are deviated nasal septum.  No mucopurulence, polyps, or masses noted.   Right Middle meatus:  congested Right SE Recess: clear Left MM: congested Left SE Recess: clear Frontal recess clear bilaterally  Photodocumentation was obtained.      Procedure Note Pre-procedure diagnosis:  throat clearing and mucous Post-procedure diagnosis: Same Procedure: Transnasal Fiberoptic Laryngoscopy, CPT 31575 - Mod 25 Indication:excessive throat clearing and mucous production since covid infection Complications: None apparent EBL: 0 mL  The procedure was undertaken to further evaluate the patient's complaint of throat clearing, with mirror exam inadequate for appropriate examination due to gag reflex and poor patient tolerance  Procedure:  Patient was identified as correct patient. Verbal consent was obtained. The nose was sprayed with oxymetazoline and 4% lidocaine. The The flexible laryngoscope was passed through the nose to view the nasal cavity, pharynx (oropharynx, hypopharynx) and larynx.  The larynx was examined at rest and during multiple phonatory tasks. Documentation was obtained and reviewed with patient. The scope was removed. The patient tolerated the procedure well.  Findings: The nasal cavity and nasopharynx did not reveal any masses  or lesions, mucosa appeared to be without obvious lesions. The tongue base, pharyngeal walls, piriform sinuses, vallecula, epiglottis and postcricoid region are normal in appearance EXCEPT: arytenoid erythema and edema. The visualized portion of the subglottis and proximal trachea is widely patent. The vocal folds are mobile bilaterally. There are no lesions on the free edge of the vocal folds nor elsewhere in the larynx worrisome for malignancy.    Electronically signed by: Penne Croak, DO 08/31/2024 3:23 PM     Impression & Plans:  Jarita Raval is a 83 y.o. female  1. Laryngopharyngeal reflux (LPR)   2. Gastroesophageal reflux disease without esophagitis   3. Sensorineural hearing loss (SNHL) of right ear with restricted hearing of left  ear   4. Sensorineural hearing loss (SNHL) of left ear with restricted hearing of right ear   5. Throat clearing   6. DNS (deviated nasal septum)   7. Nasal turbinate hypertrophy    - Findings and diagnoses discussed in detail with the patient. - Risks, benefits, and alternatives were reviewed. Through shared decision making, the patient elects to proceed with below. Assessment & Plan Bruna is a pleasant 61F with multiple complaints. Her main complaint at this time is thick mucous in her throat. She has been intermittently using omprazole.  Chronic nasal congestion and deviated nasal septum Chronic nasal congestion with significantly deviated nasal septum, primarily affecting the left side, causing narrow airway and breathing difficulty. Past sinus surgery related to brain tumor removal may have contributed to current nasal anatomy. 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 Pt is not interested in any future surgeries. Will try increasing medical therapy. - Recommend daily use of Flonase (fluticasone) nasal spray to reduce nasal inflammation and improve airway patency. Consider Nasacort as an alternative if preferred. - Discussed CT scan of the sinuses to evaluate extent of sinus involvement, patient and daughter declined  Bilateral tinnitus and hearing loss, left greater than right Bilateral tinnitus and hearing loss, left ear worse than right. Long-standing tinnitus has worsened recently.  -Reviewed MRI of the brain without contrast 2023- IAC clear. Pt did have MRI w/wo contrast in [facility] system 2025 with no mention of retrocochlear pathology. She does not want any more imaging studies.  - Recommend hearing aid evaluation.  - Recommend background vs white noise as a distraction  Gastroesophageal reflux disease with laryngeal symptoms GERD with laryngeal symptoms, including thick, mucous-like secretions in throat, likely due to reflux-induced irritation. Symptoms worse at night and morning,  consistent with reflux patterns. Examination shows throat redness indicative of reflux. - Prescribe omeprazole 20 mg, to be taken twice daily (before breakfast and dinner) for 8-12 weeks to reduce gastric acid production and allow healing of esophageal and laryngeal mucosa.  - Orders placed: No orders of the defined types were placed in this encounter.  - Medications prescribed/continued/adjusted:  Meds ordered this encounter  Medications   fluticasone (FLONASE) 50 MCG/ACT nasal spray    Sig: Place 2 sprays into both nostrils daily.    Dispense:  16 g    Refill:  6   omeprazole (PRILOSEC) 20 MG capsule    Sig: Take 1 capsule (20 mg total) by mouth 2 (two) times daily before a meal.    Dispense:  180 capsule    Refill:  1   - Education materials provided to the patient. - Follow up: 8 weeks, thick mucous in throat  - consider ipratropium bromide vs GI evaluation if symptoms not improved. Patient instructed to return sooner or  go to the ED if new/worsening symptoms develop.   Thank you for allowing me the opportunity to care for your patient. Please do not hesitate to contact me should you have any other questions.  Sincerely, Penne Croak, DO Otolaryngologist (ENT) [facility] Phone: [phone] Fax: [phone]  08/31/2024, 3:23 PM   I spent 75 minutes (exclusive of separately billable procedures) on the day of the encounter reviewing the patient's chart, seeing the patient face to face, discussing the findings and the treatment plan, and documenting in the EHR.

## 2024-08-31 NOTE — Progress Notes (Unsigned)
  [address], [address] [place], [place] [address] [phone]  Audiological Evaluation    Name: Jennifer Melendez     DOB:   1941-06-25      MRN:   [id number]                                                                                     Service Date: 08/31/2024     Accompanied by: daugther   Patient comes today after Dr. Anice, ENT sent a referral for a hearing evaluation due to concerns with tinnitus.   Symptoms Yes Details  Hearing loss  [x]  Perceived sometimes  Tinnitus  [x]  Both ears for years  Ear pain/ infections/pressure  []    Balance problems  []    Noise exposure history  []    Previous ear surgeries  []    Family history of hearing loss  []    Amplification  [x]  Has a set of aids that were fit at [facility] 5-6 years ago  Other  []      Otoscopy: Right ear: Clear external ear canal and notable landmarks visualized on the tympanic membrane. Left ear:  Clear external ear canal and notable landmarks visualized on the tympanic membrane.  Tympanometry: Right ear: Normal external ear canal volume with normal middle ear pressure and tympanic membrane compliance (Type A). Findings are suggestive of normal middle ear function. Left ear: Normal external ear canal volume with normal middle ear pressure and low tympanic membrane compliance (Type As).   Hearing Evaluation The hearing test results were completed under headphones and results are deemed to be of good to fair reliability. Test technique:  conventional    Pure tone Audiometry: Right ear- Mild to moderately severe sensorineural hearing loss from 125 Hz - 8000 Hz. Left ear-  Mild to severe sensorineural hearing loss from 125 Hz - 8000 Hz.  Speech Audiometry: Right ear- Speech Reception Threshold (SRT) was obtained at 35 dBHL. Left ear-Speech Reception Threshold (SRT) was obtained at 45 dBHL.   Word Recognition Score Tested using NU-6 (recorded) Right ear: 92% was obtained at a presentation level  of 80 dBHL with contralateral masking which is deemed as  excellent. Left ear: 88% was obtained at a presentation level of 85 dBHL with contralateral masking which is deemed as  good .   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   Recommendations: Follow up with ENT as scheduled for today. Return for a hearing evaluation if concerns with hearing changes arise or per MD recommendation. Consider a communication needs assessment after medical clearance for hearing aids is obtained.   Azzure Garabedian MARIE LEROUX-MARTINEZ, AUD

## 2024-09-07 ENCOUNTER — Encounter: Payer: Self-pay | Admitting: Audiology

## 2024-11-08 NOTE — Progress Notes (Signed)
 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  Pathology showed breast carcinoma, ER and PR positive at 90%, HER2 neu 2+, sent for FISH which is pending 01/14/2024 diagnostic mammogram with ultrasound as a follow-up from an abnormal screening mammogram showed a left breast irregular 6.2 cm mass, at 9:00, which abuts the base of the nipple and involves the overlying skin surface corresponding to the site of the patient's biopsy-proven malignancy.  Two highly suspicious morphologically abnormal left level 1 axillary lymph nodes.  01/26/2024 ultrasound-guided biopsy of the most suspicious left axillary lymph node was performed.  Final pathology was read as moderate to poorly differentiated ductal carcinoma involving fibril adipose tissue.  ER positive at 100%, PR positive at 100%,  HER2/neu 2+, FISH is negative 02/12/24 CT chest abdomen pelvis bone scan negative for metastatic disease 02/23/2024 started anastrozole   Subjective   Chief Complaint:   follow up patient visit for breast cancer.   History of Present Illness The patient is an 84 year old female who presents for evaluation of breast cancer and blood pressure management.  She reports intermittent breast pain, with more pronounced discomfort under her arm, describing the area as occasionally sore but not severely painful. She has been adhering to her prescribed medication regimen without any missed doses, except for calcium, which she does not take. Her diet is suboptimal, and she does not consume dairy products. A recent mammogram and ultrasound revealed a reduction in the size of the mass. She expresses concern about  potential metastasis to the lymph nodes. Hair thinning has been observed, particularly noticeable during hair washing. Pruritus is managed with lotion application. Initially, severe hot flashes were experienced, which have largely subsided, although she occasionally wakes up with wet hair.    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  HRT: for a few years   Past Medical History:  has no past medical history on file.   Past Surgical History:   has no past surgical history on file.  Social History:   reports that she has never smoked. She has never used smokeless tobacco. No history on file for alcohol use and drug use.  Family History:  family history includes Cancer in her brother, father, and mother; Colon cancer in her father; Diabetes in her sister; Leukemia in her brother; Lung cancer in her mother; Stroke in her sister.  Allergies: Allergies  Allergen Reactions   Covid-19 (Mrna) Vaccine Palpitations   Losartan Potassium Other and Palpitations   Amlodipine  Besylate Other    Unknown  Maybe body shaking   Ciprofloxacin Other    Unknown reaction   Diltiazem  Hcl Other   Diltiazem  Hcl Er Other    Shaking of arms, eyes not focusing, stuffy   Gabapentin Hives and Other    Hives   Hydrocodone -Acetaminophen  Dizziness   Morphine Other and Nausea And Vomiting    Could not stand up   Morpholine Salicylate Other    Could not stand up   Ofloxacin Other    Felt like she was dying  Unknown   Prednisolone Other    Unknown reaction  Prednisone Other and Rash    Facial numbness    Medications:    Medication Sig Dispense Refill   acetaminophen  (TYLENOL ) 500 mg tablet Take one tablet (500 mg dose) by mouth 2 (two) times a day as needed.     anastrozole (ARIMIDEX) 1 MG tablet Take one tablet (1 mg dose) by mouth  daily. 90 tablet 2   calcium carb-cholecalciferol 600-10 mg-mcg TABS Take one tablet by mouth 2 (two) times daily for 30 days. 60 tablet 0   calcium carbonate-vitamin D (OYST-CAL,OSCAL-500) 500-5 mg-mcg per tablet Take one tablet by mouth daily.     cyanocobalamin 100 MCG tablet Take one tablet (100 mcg dose) by mouth daily.     fluticasone  propionate (FLONASE ) 50 mcg/actuation nasal spray two sprays by Nasal route daily.     metoprolol  succinate (TOPROL -XL) 100 mg 24 hr tablet Take one tablet (100 mg dose) by mouth daily.     metoprolol  succinate (TOPROL -XL) 50 mg 24 hr tablet Take 1 & 1/2 tablets  (75 mg) by mouth daily (Patient taking differently: Take one tablet (50 mg dose) by mouth at bedtime.)     omeprazole  (PRILOSEC) 20 mg capsule Take one capsule (20 mg dose) by mouth 2 (two) times daily before meals.     pregabalin  (LYRICA ) 100 mg capsule Take three capsules (300 mg dose) by mouth daily.     No current facility-administered medications for this visit.    Review of Systems: Review of systems negative other than stated in HPI.  Objective   Physical Exam:  Vital Signs: BP 142/84 (BP Location: Left Upper Arm, Patient Position: Sitting)   Pulse 66   Temp 98.5 F (36.9 C) (Temporal)   Resp 18   Ht 5' 6 (1.676 m)   Wt 196 lb (88.9 kg)   SpO2 97%   Breastfeeding No   BMI 31.64 kg/m   ECOG:  (2) Ambulatory and capable of self care, unable to carry out work activity, up and about > 50% or waking hours Constitutional: The patient is in no apparent distress, alert oriented, able to speak in full sentences, and well-nourished. Eyes: Conjunctivas and lids are pink and moist.  Pupils are equal and normally reactive to light.  Extraocular movements are intact. No scleral icterus. Ears, Nose mouth and throat: Oropharynx is without oral ulceration or exudate. Nodes: There are no palpable nodes in the cervical, supraclavicular, axillary, or inguinal regions. Skin: There are no  abnormal nevi, ecchymoses, petechiae, or rashes. Chest: Lungs are clear to auscultation bilaterally.  No wheezes, rales, or rhonchi. Heart: Regular rate and rhythm.  No murmur, rub or gallop. Abdomen: Soft, non-tender, non distended.  No hepatosplenomegaly, No guarding or masses.  Musculoskeletal: There is no edema, clubbing or cyanosis. Neurologic: No focal motor or sensory deficits.  Gait and speech are normal and fully oriented.   Psychosocial: Does not appear anxious or depressed. Breast: Bilateral breast implants in place.  In the left breast medial inner part there is skin erythema/ulceration with an underlying firm mass causing nipple retraction.  No nipple discharge. Overall unchanged from prior. No palpable mass.   No palpable axillary lymph nodes.  Patient was offered the choice of having a chaperone in the room during examination.  The patient declined to have a chaperone present despite awareness of the sensitive nature of the physical examination components to be performed.   Accessory Clinical Data: All pertinent labs/imaging reviewed and discussed with patient.  Reviewed recent B12, CMP and CBC from  10/23 ordered by PCP  CT CAP 09/13/2025 IMPRESSION: No evidence of metastatic disease.     Please note that any pulmonary nodule follow up recommendations are in accordance with the Fleischner Society 2017 guidelines, unless otherwise stated.   Electronically Signed by: Lynwood Portugal on 09/13/2024 12:13 PM Impression   Kawana Hegel is a 84 y.o. female with medical history significant for hypertension who presents to establish care with oncology for her recently diagnosed breast cancer.   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  Status post punch biopsy that was positive for hormone receptor positive breast cancer.  Further workup included diagnostic mammogram with ultrasound that identified a 6.2 cm mass abutting the base of the nipple and involving the overlying skin surface as well as 2 suspicious lymph nodes.  She was found to have pathologically confirmed metastatic disease to the axilla.  PR is positive at 100%, PR is positive at 100% and HER2/neu is 2+ with negative FISH.  CT chest abdomen pelvis from 02/12/2024 and bone scan from 02/09/2024 without evidence of metastatic disease.  Did show a mildly enlarged right axillary node.  Patient is not interested in any chemotherapy or surgery.  Discussed role for palliative endocrine therapy and she is very agreeable to this.  02/23/2024 started anastrozole. Could potentially add CDK 4/6 in the future depending on tolerance and her preference.  If she has good response in the future and she is open to a potential surgery, will refer to breast surgeon.  - Does clinically well, no evidence of toxicity. No major changes to the mass clinically. Expect max response 6-12 months from treatment - Mammogram from 10/12/2024 showed slightly interval decrease in size of the left breast and stable enlarged left axillary nodes.   - CT chest abdomen pelvis from 08/2024 without evidence of distant disease. - Continue anastrozole 1 mg p.o. daily  -Will plan for mammogram/CT abdomen pelvis every 6 to 12 months to assess response to therapy - Will obtain tumor markers at next follow-up  # Hypertension -She is slightly hypertensive today.  She is in the process of finding a new PCP so I refilled her metoprolol  today per her prior cardiologist recommendations until she is established with a new primary care provider.  #  Urinary incontinence - Referral to urology, elected not to follow up  # History of meningioma Pursue an MRI of the brain on 02/17/2024 given history of meningioma and new  headaches..  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 This is unchanged when compared to the prior MRI of the head dated 02/17/2024. If further evaluation desired, recommend CT of the sinus. Moderate encephalomalacia in the left frontal lobe unchanged from the previous examination.   # Bone health/osteopenia Most recent bone density scan from 02/09/2024 showed mild osteopenia with a T-score of -1.6. Reviewed the follow-up Zometa.  She reports she will consider it in the future Recommend supplementation of calcium and vitamin D daily  # Genetics Not indicated NCCN guidelines  Return to clinic in 3 months for visit and labs, or sooner if needed   Patient knows to contact us  prior to next appointment for questions,  concerns or new symptoms. Patient voiced understanding of disease state and current treatment and instructions.    Disclaimer:  This note was dictated with voice recognition software. Similar sounding words can inadvertently be transcribed and may not be corrected upon review.  Digital scribe technology was used in creating this visit note. Verbal consent from the patient/caregiver was obtained prior to its use.   Rolan Doheny, MD 11/08/2024 / 11:48 AM

## 2024-11-11 ENCOUNTER — Telehealth: Payer: Self-pay | Admitting: Cardiology

## 2024-11-11 NOTE — Telephone Encounter (Signed)
 Patient denies chest pain at this time, reports chest soreness that she attributes to lifting a heavy object.  States that her PCP, Dr. Teresa, had her taking metoprolol  100 mg in the morning and 50 mg in the evening; however, the 50 mg evening dose was discontinued at some point.  Patient states she saw her Oncologist this past Tuesday, who sent in a prescription for her to resume metoprolol  as previously prescribed. She restarted the 50 mg evening dose two days ago but reports her blood pressure has not yet returned to normal.   Reports highest BP yesterday 192/109 HR 76, today 189/100 HR 73. Blood pressure currently while on the phone 176/86 with HR 72. States that her home cuff was checked with the drs office to ensure accuracy.   Blood pressure readings were taken at various times throughout the day. She denies headaches, dizziness, or blurred vision related to her blood pressure.   Patient reports she cannot tolerate Losartan or Norvasc , as they cause a drunk feeling and increased unsteadiness.  Appt made with DOD on Monday. Will also forward over to Dr Sheena for advisement. Went over ED protocol and pt states she will got to [facility] ED if she has any symptoms.

## 2024-11-11 NOTE — Telephone Encounter (Signed)
 Pt c/o BP issue: STAT if pt c/o blurred vision, one-sided weakness or slurred speech.   1. What is your BP concern? BP is high.  2. Have you taken any BP medication today? Metoprolol  100 MG around 6-6:30 AM  3. What are your last 5 BP readings? 1/15: 192/109 76 1/16: 189/100 73          177/86 68  4. Are you having any other symptoms (ex. Dizziness, headache, blurred vision, passed out)?  Chest pain

## 2024-11-13 NOTE — Progress Notes (Signed)
 " Cardiology Office Note:  .   Date:  11/14/2024  ID:  Jennifer Melendez, DOB 08-29-1941, MRN [id number] PCP: Teresa Thersia Jansky, MD  [facility] Providers Cardiologist:  Dub Huntsman, DO    History of Present Illness: .      Discussed the use of AI scribe software for clinical note transcription with the patient, who gave verbal consent to proceed.  History of Present Illness Jennifer Melendez is an 84 year old female with hypertension and paroxysmal atrial tachycardia who presents for a six-month follow-up and to discuss hypertension and chest pain. She is accompanied by her daughter.  She has a history of hypertension, currently managed with metoprolol  100 mg in the morning and 50 mg at night. Despite this regimen, her blood pressure remains elevated, with recent readings of 192/109 and 189/100. Her blood pressure began to rise significantly after the 50 mg evening dose of metoprolol  was discontinued and later reinstated without improvement.   She experiences chest pain described as a dull ache, persistent for the last three to four days, unlike previous episodes that resolved within a couple of hours. She also reports feeling 'weird' and having a 'full' head sensation. No blurry vision. She monitors her blood pressure frequently, up to 3-4 times a day, using an Omron cuff, and notes that the monitor often shows an irregular rhythm, though she does not consistently feel palpitations.  She has a history of breast cancer diagnosed in 2025 and mentions that her current medication for cancer might be affecting her blood pressure. She has allergies to amlodipine , diltiazem , and losartan, and has previously experienced adverse reactions to losartan Norvasc , including a 'drunk feeling' and increased unsteadiness. She is concerned about trying new medications due to past sensitivities and lives alone, which adds to her apprehension.         ROS: Remaining review of systems  negative  Studies Reviewed: .        Results  Risk Assessment/Calculations:           Physical Exam:   VS:  BP (!) 184/76 (BP Location: Left Arm, Patient Position: Sitting, Cuff Size: Normal)   Pulse 76   Ht 5' 7 (1.702 m)   Wt 189 lb 9.6 oz (86 kg)   SpO2 97%   BMI 29.70 kg/m    Wt Readings from Last 3 Encounters:  11/14/24 189 lb 9.6 oz (86 kg)  08/31/24 190 lb (86.2 kg)  03/23/24 193 lb 3.2 oz (87.6 kg)    GEN: Well nourished, well developed in no acute distress NECK: No JVD; No carotid bruits CARDIAC:  RRR, no murmurs, no rubs, no gallops RESPIRATORY:  Clear to auscultation without rales, wheezing or rhonchi  ABDOMEN: Soft, non-tender, non-distended EXTREMITIES:  No edema; No deformity   ASSESSMENT AND PLAN: .    Assessment and Plan Assessment & Plan Hypertension  hypertensive urgency Poorly controlled on metoprolol . Blood pressure dangerously high. Previous intolerance to other antihypertensives. Discussed potential new medications pending lab results. Explained possible need for hospitalization if symptomatic hypertension. - Ordered blood work to assess kidney function and electrolytes. - If lab results normal, initiate hydrochlorothiazide  or spironolactone. - Continue metoprolol  100 mg in the morning and 50 mg at night and consider changing to carvedilol. - Monitor blood pressure at home, especially post-medication. - Advised to seek medical attention if blood pressure exceeds 180 mmHg with symptoms or if >200   Paroxysmal supraventricular and ventricular arrhythmias Managed with metoprolol , effective for heart  rate control. Occasional palpitations but well-controlled. - Continue metoprolol  100 mg in the morning and 50 mg at night. - Consider changing carvedilol  Chronic left bundle branch block No acute changes or symptoms reported. - Continue current management and monitoring.            Follow up: 1 month  Signed, Emeline FORBES Calender, DO  11/14/2024  11:15 AM    [facility] "

## 2024-11-14 ENCOUNTER — Encounter: Payer: Self-pay | Admitting: Internal Medicine

## 2024-11-14 ENCOUNTER — Ambulatory Visit: Attending: Internal Medicine | Admitting: Internal Medicine

## 2024-11-14 VITALS — BP 184/76 | HR 76 | Ht 67.0 in | Wt 189.6 lb

## 2024-11-14 DIAGNOSIS — I4719 Other supraventricular tachycardia: Secondary | ICD-10-CM | POA: Diagnosis not present

## 2024-11-14 DIAGNOSIS — I10 Essential (primary) hypertension: Secondary | ICD-10-CM | POA: Diagnosis not present

## 2024-11-14 DIAGNOSIS — I160 Hypertensive urgency: Secondary | ICD-10-CM

## 2024-11-14 LAB — BASIC METABOLIC PANEL WITH GFR
BUN/Creatinine Ratio: 10 — ABNORMAL LOW (ref 12–28)
BUN: 10 mg/dL (ref 8–27)
CO2: 22 mmol/L (ref 20–29)
Calcium: 10.4 mg/dL — ABNORMAL HIGH (ref 8.7–10.3)
Chloride: 102 mmol/L (ref 96–106)
Creatinine, Ser: 0.98 mg/dL (ref 0.57–1.00)
Glucose: 111 mg/dL — ABNORMAL HIGH (ref 70–99)
Potassium: 4.5 mmol/L (ref 3.5–5.2)
Sodium: 142 mmol/L (ref 134–144)
eGFR: 57 mL/min/1.73 — ABNORMAL LOW

## 2024-11-14 NOTE — Patient Instructions (Signed)
 Medication Instructions:  Your physician recommends that you continue on your current medications as directed. Please refer to the Current Medication list given to you today.   *If you need a refill on your cardiac medications before your next appointment, please call your pharmacy*  Lab Work: BMP at [facility]   If you have labs (blood work) drawn today and your tests are completely normal, you will receive your results only by: MyChart Message (if you have MyChart) OR A paper copy in the mail If you have any lab test that is abnormal or we need to change your treatment, we will call you to review the results.  Testing/Procedures: NONE   Follow-Up: At [facility], you and your health needs are our priority.  As part of our continuing mission to provide you with exceptional heart care, our providers are all part of one team.  This team includes your primary Cardiologist (physician) and Advanced Practice Providers or APPs (Physician Assistants and Nurse Practitioners) who all work together to provide you with the care you need, when you need it.  Your next appointment:   1 month(s)  Provider:   One of our Advanced Practice Providers (APPs): Morse Clause, PA-C  Lamarr Satterfield, NP Miriam Shams, NP  Olivia Pavy, PA-C Josefa Beauvais, NP  Leontine Salen, PA-C Orren Fabry, PA-C  Hao Meng, PA-C Ernest Dick, NP  Damien Braver, NP Jon Hails, PA-C  Waddell Donath, PA-C    Dayna Dunn, PA-C  Scott Weaver, PA-C Lum Louis, NP Katlyn West, NP Callie Goodrich, PA-C  Xika Zhao, NP Sheng Haley, PA-C    Kathleen Johnson, PA-C    We recommend signing up for the patient portal called MyChart.  Sign up information is provided on this After Visit Summary.  MyChart is used to connect with patients for Virtual Visits (Telemedicine).  Patients are able to view lab/test results, encounter notes, upcoming appointments, etc.  Non-urgent messages can be sent to your provider as well.    To learn more about what you can do with MyChart, go to [email or web address].   Other Instructions Please monitor your Blood Pressure

## 2024-11-15 ENCOUNTER — Ambulatory Visit: Payer: Self-pay | Admitting: Internal Medicine

## 2024-11-15 DIAGNOSIS — I10 Essential (primary) hypertension: Secondary | ICD-10-CM

## 2024-11-15 NOTE — Progress Notes (Signed)
 BMP shows normal renal function and electrolytes.   Patient can start on chlorthalidone  25 mg daily with repeat BMP in 1 week.   Thank you, Emeline FORBES Calender, DO

## 2024-11-16 ENCOUNTER — Ambulatory Visit (INDEPENDENT_AMBULATORY_CARE_PROVIDER_SITE_OTHER)

## 2024-11-16 MED ORDER — CHLORTHALIDONE 25 MG PO TABS: 25.0000 mg | ORAL_TABLET | Freq: Every day | ORAL | 3 refills | Status: AC

## 2024-11-24 ENCOUNTER — Ambulatory Visit (INDEPENDENT_AMBULATORY_CARE_PROVIDER_SITE_OTHER)

## 2024-12-22 ENCOUNTER — Ambulatory Visit: Admitting: Emergency Medicine

## 2025-03-22 ENCOUNTER — Ambulatory Visit: Admitting: Family Medicine
